# Patient Record
Sex: Female | Born: 1985 | Race: White | Hispanic: No | Marital: Married | Smoking: Never smoker
Health system: Southern US, Community
[De-identification: ages and names within clinical notes are randomized; demographics above are authoritative.]

## PROBLEM LIST (undated history)

## (undated) ENCOUNTER — Inpatient Hospital Stay (HOSPITAL_COMMUNITY): Payer: Self-pay

## (undated) DIAGNOSIS — Z789 Other specified health status: Secondary | ICD-10-CM

## (undated) DIAGNOSIS — IMO0002 Reserved for concepts with insufficient information to code with codable children: Secondary | ICD-10-CM

## (undated) DIAGNOSIS — M419 Scoliosis, unspecified: Secondary | ICD-10-CM

## (undated) DIAGNOSIS — D649 Anemia, unspecified: Secondary | ICD-10-CM

## (undated) DIAGNOSIS — R87619 Unspecified abnormal cytological findings in specimens from cervix uteri: Secondary | ICD-10-CM

## (undated) DIAGNOSIS — F419 Anxiety disorder, unspecified: Secondary | ICD-10-CM

## (undated) DIAGNOSIS — K219 Gastro-esophageal reflux disease without esophagitis: Secondary | ICD-10-CM

## (undated) DIAGNOSIS — R87629 Unspecified abnormal cytological findings in specimens from vagina: Secondary | ICD-10-CM

## (undated) DIAGNOSIS — I739 Peripheral vascular disease, unspecified: Secondary | ICD-10-CM

## (undated) DIAGNOSIS — B977 Papillomavirus as the cause of diseases classified elsewhere: Secondary | ICD-10-CM

## (undated) DIAGNOSIS — K429 Umbilical hernia without obstruction or gangrene: Secondary | ICD-10-CM

## (undated) DIAGNOSIS — R51 Headache: Secondary | ICD-10-CM

## (undated) HISTORY — PX: OTHER SURGICAL HISTORY: SHX169

## (undated) HISTORY — PX: BREAST ENHANCEMENT SURGERY: SHX7

## (undated) HISTORY — PX: SPINAL FUSION: SHX223

## (undated) HISTORY — PX: DENTAL SURGERY: SHX609

## (undated) HISTORY — PX: ABDOMINAL HYSTERECTOMY: SHX81

## (undated) HISTORY — PX: BACK SURGERY: SHX140

## (undated) HISTORY — PX: WISDOM TOOTH EXTRACTION: SHX21

## (undated) HISTORY — PX: HERNIA REPAIR: SHX51

---

## 2007-12-25 ENCOUNTER — Emergency Department (HOSPITAL_COMMUNITY): Admission: EM | Admit: 2007-12-25 | Discharge: 2007-12-26 | Payer: Self-pay | Admitting: Emergency Medicine

## 2008-03-29 ENCOUNTER — Emergency Department (HOSPITAL_COMMUNITY): Admission: EM | Admit: 2008-03-29 | Discharge: 2008-03-29 | Payer: Self-pay | Admitting: Emergency Medicine

## 2008-07-08 ENCOUNTER — Emergency Department (HOSPITAL_COMMUNITY): Admission: EM | Admit: 2008-07-08 | Discharge: 2008-07-09 | Payer: Self-pay | Admitting: Emergency Medicine

## 2008-09-25 ENCOUNTER — Emergency Department (HOSPITAL_COMMUNITY): Admission: EM | Admit: 2008-09-25 | Discharge: 2008-09-25 | Payer: Self-pay | Admitting: Emergency Medicine

## 2010-08-19 LAB — GC/CHLAMYDIA PROBE AMP, GENITAL
Chlamydia: NEGATIVE
Gonorrhea: NEGATIVE

## 2010-08-19 LAB — ABO/RH

## 2010-08-19 LAB — HEPATITIS B SURFACE ANTIGEN: Hepatitis B Surface Ag: NEGATIVE

## 2010-08-19 LAB — RUBELLA ANTIBODY, IGM: Rubella: IMMUNE

## 2010-09-15 LAB — URINE MICROSCOPIC-ADD ON

## 2010-09-15 LAB — WET PREP, GENITAL: Clue Cells Wet Prep HPF POC: NONE SEEN

## 2010-09-15 LAB — URINALYSIS, ROUTINE W REFLEX MICROSCOPIC
Glucose, UA: NEGATIVE mg/dL
Leukocytes, UA: NEGATIVE
Nitrite: NEGATIVE
Specific Gravity, Urine: 1.007 (ref 1.005–1.030)

## 2010-09-15 LAB — GC/CHLAMYDIA PROBE AMP, GENITAL
Chlamydia, DNA Probe: NEGATIVE
GC Probe Amp, Genital: NEGATIVE

## 2010-09-21 LAB — CBC
HCT: 45.9 % (ref 36.0–46.0)
Hemoglobin: 15.6 g/dL — ABNORMAL HIGH (ref 12.0–15.0)
MCHC: 34 g/dL (ref 30.0–36.0)
MCV: 89 fL (ref 78.0–100.0)
Platelets: 239 10*3/uL (ref 150–400)
RBC: 5.16 MIL/uL — ABNORMAL HIGH (ref 3.87–5.11)
RDW: 12.7 % (ref 11.5–15.5)
WBC: 14.7 10*3/uL — ABNORMAL HIGH (ref 4.0–10.5)

## 2010-09-21 LAB — COMPREHENSIVE METABOLIC PANEL
Alkaline Phosphatase: 65 U/L (ref 39–117)
BUN: 10 mg/dL (ref 6–23)
CO2: 23 mEq/L (ref 19–32)
Calcium: 9.5 mg/dL (ref 8.4–10.5)
GFR calc Af Amer: >60 mL/min (ref >60)
GFR calc non Af Amer: >60 mL/min (ref >60)
Glucose, Bld: 146 mg/dL — ABNORMAL HIGH (ref 70–99)
Total Bilirubin: 0.8 mg/dL (ref 0.3–1.2)
Total Protein: 7.8 g/dL (ref 6.0–8.3)

## 2010-09-21 LAB — DIFFERENTIAL
Eosinophils Absolute: 0 10*3/uL (ref 0.0–0.7)
Eosinophils Relative: 0 % (ref 0–5)
Lymphocytes Relative: 2 % — ABNORMAL LOW (ref 12–46)
Monocytes Absolute: 0.2 10*3/uL (ref 0.1–1.0)
Neutrophils Relative %: 97 % — ABNORMAL HIGH (ref 43–77)

## 2010-09-21 LAB — LIPASE, BLOOD: Lipase: 17 U/L (ref 11–59)

## 2010-09-21 LAB — PREGNANCY, URINE: Preg Test, Ur: NEGATIVE

## 2010-09-21 LAB — URINALYSIS, ROUTINE W REFLEX MICROSCOPIC
Hgb urine dipstick: NEGATIVE
Ketones, ur: NEGATIVE mg/dL

## 2011-01-24 ENCOUNTER — Encounter (HOSPITAL_COMMUNITY): Payer: Self-pay | Admitting: *Deleted

## 2011-01-24 ENCOUNTER — Inpatient Hospital Stay (HOSPITAL_COMMUNITY)
Admission: AD | Admit: 2011-01-24 | Discharge: 2011-01-24 | Disposition: A | Discharge status: Home / Self Care | Source: Ambulatory Visit | Attending: Obstetrics and Gynecology | Admitting: Obstetrics and Gynecology

## 2011-01-24 DIAGNOSIS — O479 False labor, unspecified: Secondary | ICD-10-CM

## 2011-01-24 DIAGNOSIS — O4700 False labor before 37 completed weeks of gestation, unspecified trimester: Secondary | ICD-10-CM | POA: Insufficient documentation

## 2011-01-24 DIAGNOSIS — R109 Unspecified abdominal pain: Secondary | ICD-10-CM | POA: Insufficient documentation

## 2011-01-24 HISTORY — DX: Other specified health status: Z78.9

## 2011-01-24 LAB — URINALYSIS, ROUTINE W REFLEX MICROSCOPIC
Glucose, UA: NEGATIVE mg/dL
Hgb urine dipstick: NEGATIVE
Ketones, ur: NEGATIVE mg/dL
Protein, ur: NEGATIVE mg/dL
Specific Gravity, Urine: 1.01 (ref 1.005–1.030)
pH: 7 (ref 5.0–8.0)

## 2011-01-24 NOTE — Progress Notes (Signed)
Pt reports "my lower back started hurting really bad and this my lower stomach started hurting", pt reports symptoms x 6 hours. Denies bleeding or gush of fluid

## 2011-01-24 NOTE — Progress Notes (Signed)
DENIES HSV AND MRSA.  HAS LOWER ABD AND BACK PAIN- STARTED AT 2 PM. -  IS NOW SAME.  DID NOT TAKE ANYTHING  FOR  PAIN. CALLED DR AT  ON CALL NURSE AT 630PM - TOLD TO COME IN.NO BURNING WITH URINATION.   FEELS BABY MOVE.

## 2011-01-24 NOTE — ED Provider Notes (Signed)
History     Chief Complaint  Patient presents with  . Abdominal Pain   HPI  Pt is here with report of intermittent contractions that started at 1400 today; does not recall the frequency.  Pt denies vaginal bleeding, UTI symptoms, or leaking of fluid.  No reports of recent sexual intercourse.   Past Medical History  Diagnosis Date  . No pertinent past medical history     Past Surgical History  Procedure Date  . Spinal fusion     for scoliosis, spinal fusion with screws and rods  . No past surgeries     No family history on file.  History  Substance Use Topics  . Smoking status: Never Smoker   . Smokeless tobacco: Not on file  . Alcohol Use: No    Allergies: No Known Allergies  Prescriptions prior to admission  Medication Sig Dispense Refill  . acetaminophen (TYLENOL) 500 MG tablet Take 1,000 mg by mouth every 6 (six) hours as needed. Patient took this medication for pain.       Marland Kitchen azithromycin (ZITHROMAX) 250 MG tablet Take 250 mg by mouth daily.        Marland Kitchen dextromethorphan-guaiFENesin (ROBITUSSIN-DM) 10-100 MG/5ML liquid Take 5 mLs by mouth every 4 (four) hours as needed.          Review of Systems  Gastrointestinal: Positive for abdominal pain.  All other systems reviewed and are negative.   Physical Exam   Blood pressure 115/72, pulse 101, temperature 98.3 F (36.8 C), temperature source Oral, resp. rate 20, height 5' (1.524 m), weight 73.993 kg (163 lb 2 oz).  Physical Exam  Constitutional: She is oriented to person, place, and time. She appears well-developed and well-nourished.  HENT:  Head: Normocephalic.  Neck: Normal range of motion. Neck supple.  Cardiovascular: Normal rate, regular rhythm and normal heart sounds.   Respiratory: Effort normal and breath sounds normal.  GI: Soft. There is no tenderness.       FHR 120's, +accel, Category I Toco - irregular contractions, with irritability in between  Genitourinary: No bleeding around the vagina.   Cervix - closed  Neurological: She is alert and oriented to person, place, and time. She has normal reflexes.  Skin: Skin is warm and dry.    MAU Course  Procedures  UA - neg Assessment and Plan  Braxton Hicks  Plan: DC to Home Encourage hydration Return for return in contractions [facility] 01/24/2011, 9:33 PM

## 2011-01-24 NOTE — Consult Note (Signed)
Reviewed HPI/Exam/irregular contraction pattern>DC home with labor precautions.

## 2011-02-07 ENCOUNTER — Inpatient Hospital Stay (HOSPITAL_COMMUNITY)
Admission: AD | Admit: 2011-02-07 | Discharge: 2011-02-07 | Disposition: A | Discharge status: Home / Self Care | Payer: PRIVATE HEALTH INSURANCE | Source: Ambulatory Visit | Attending: Obstetrics and Gynecology | Admitting: Obstetrics and Gynecology

## 2011-02-07 ENCOUNTER — Encounter (HOSPITAL_COMMUNITY): Payer: Self-pay | Admitting: *Deleted

## 2011-02-07 DIAGNOSIS — Z0371 Encounter for suspected problem with amniotic cavity and membrane ruled out: Secondary | ICD-10-CM | POA: Insufficient documentation

## 2011-02-07 DIAGNOSIS — Z3400 Encounter for supervision of normal first pregnancy, unspecified trimester: Secondary | ICD-10-CM

## 2011-02-07 HISTORY — DX: Papillomavirus as the cause of diseases classified elsewhere: B97.7

## 2011-02-07 HISTORY — DX: Scoliosis, unspecified: M41.9

## 2011-02-07 HISTORY — DX: Unspecified abnormal cytological findings in specimens from cervix uteri: R87.619

## 2011-02-07 HISTORY — DX: Headache: R51

## 2011-02-07 HISTORY — DX: Reserved for concepts with insufficient information to code with codable children: IMO0002

## 2011-02-07 LAB — POCT FERN TEST: Fern Test: NEGATIVE

## 2011-02-07 NOTE — ED Provider Notes (Signed)
History     CSN: [id number] Arrival date & time: 02/07/2011  3:26 PM  Chief Complaint  Patient presents with  . Contractions   HPI Molly Rojas is a 25 y.o. female who presents to MAU for leaking vaginal fluid. She states she has had a lot of liquids by mouth today but wanted to be sure her water didn't break.    Past Medical History  Diagnosis Date  . No pertinent past medical history   . Abnormal Pap smear   . Scoliosis   . Headache   . HPV (human papilloma virus) infection     Past Surgical History  Procedure Date  . Spinal fusion placed rods from T-11-L4    for scoliosis, spinal fusion with screws and rods  . No past surgeries   . Back surgery   . Wisdom tooth extraction     Family History  Problem Relation Age of Onset  . Diabetes Mother   . Hyperlipidemia Father   . Diabetes Maternal Aunt   . Diabetes Maternal Uncle   . Diabetes Maternal Grandfather   . Hyperlipidemia Paternal Grandmother   . Heart disease Paternal Grandfather     History  Substance Use Topics  . Smoking status: Never Smoker   . Smokeless tobacco: Not on file  . Alcohol Use: No    OB History    Grav Para Term Preterm Abortions TAB SAB Ect Mult Living   1               Review of Systems  Physical Exam  BP 116/72  Pulse 97  Temp(Src) 98.4 F (36.9 C) (Oral)  Resp 16  Ht 5' (1.524 m)  Wt 164 lb 6.4 oz (74.571 kg)  BMI 32.11 kg/m2  SpO2 99%  Physical Exam  Nursing note and vitals reviewed. Constitutional: She is oriented to person, place, and time. She appears well-developed and well-nourished.  HENT:  Head: Normocephalic.  Eyes: EOM are normal.  Neck: Neck supple.  Pulmonary/Chest: Effort normal.  Abdominal: Soft. There is no tenderness.       Gravid consistent with dates.  Genitourinary:       Cervix closed, thick and high. Uterus consistent with dates.  Musculoskeletal: Normal range of motion.  Neurological: She is alert and oriented to person, place, and time. No  cranial nerve deficit.  Skin: Skin is warm and dry.    ED Course  Procedures  EFM shows reactive tracing. Occasional contraction.  Slide for fern is negative. MDM Consult with Dr. Henderson Cloud.  Assessment: 33.[redacted] weeks gestation without rupture of membranes.  Plan: Follow up as scheduled in the office.          Return here as needed.      [place], [place] 02/07/11 [phone]

## 2011-02-07 NOTE — Progress Notes (Signed)
Pt c/o leaking clear, watery substance @ 1200 and has felt moist since then.

## 2011-02-07 NOTE — Progress Notes (Signed)
Pt states she was at work and when she got up her panties were wet and she has been having abdominal cramping and low back pain since 1200. No wearing a pad but does feel damp.

## 2011-02-16 LAB — STREP B DNA PROBE: GBS: NEGATIVE

## 2011-03-21 ENCOUNTER — Inpatient Hospital Stay (HOSPITAL_COMMUNITY)
Admission: AD | Admit: 2011-03-21 | Discharge: 2011-03-21 | Disposition: A | Discharge status: Home / Self Care | Payer: PRIVATE HEALTH INSURANCE | Source: Ambulatory Visit | Attending: Obstetrics and Gynecology | Admitting: Obstetrics and Gynecology

## 2011-03-21 DIAGNOSIS — O479 False labor, unspecified: Secondary | ICD-10-CM | POA: Insufficient documentation

## 2011-03-21 NOTE — Progress Notes (Signed)
39WKS G1 SVE 1/THICK UC'S 2-3 MIN RATES PAIN 6 OUT OF 10. REACTIVE STRIP. ORDERS GIVEN FOR PT TO WALK FOR AN HOUR THEN RECHECK CERVIX.

## 2011-03-21 NOTE — Progress Notes (Signed)
Pt c/o contractions every 5 min, no leaking fluid or vaginal bleeding. +FM

## 2011-03-21 NOTE — Progress Notes (Signed)
SVE 1/THICK  X2 UC IN 20 MIN. ORDES GIVEN TO D/C HOME WITH LABOR AND KICK COUNTS

## 2011-03-22 ENCOUNTER — Telehealth (HOSPITAL_COMMUNITY): Payer: Self-pay | Admitting: *Deleted

## 2011-03-22 NOTE — Telephone Encounter (Signed)
Preadmission screen  

## 2011-03-24 ENCOUNTER — Inpatient Hospital Stay (HOSPITAL_COMMUNITY)
Admission: AD | Admit: 2011-03-24 | Discharge: 2011-03-24 | Disposition: A | Discharge status: Home / Self Care | Payer: PRIVATE HEALTH INSURANCE | Source: Ambulatory Visit | Attending: Obstetrics and Gynecology | Admitting: Obstetrics and Gynecology

## 2011-03-24 ENCOUNTER — Encounter (HOSPITAL_COMMUNITY): Payer: Self-pay | Admitting: *Deleted

## 2011-03-24 DIAGNOSIS — O479 False labor, unspecified: Secondary | ICD-10-CM | POA: Insufficient documentation

## 2011-03-24 LAB — AMNISURE RUPTURE OF MEMBRANE (ROM) NOT AT ARMC: Amnisure ROM: NEGATIVE

## 2011-03-24 NOTE — Progress Notes (Signed)
Big gush around 0820, clear fluid- still coming.  Saw some blood tinged mucous when wiped. Was 1 cm on Mon. Denies prob with preg.

## 2011-03-28 ENCOUNTER — Encounter (HOSPITAL_COMMUNITY): Payer: Self-pay | Admitting: Anesthesiology

## 2011-03-28 ENCOUNTER — Inpatient Hospital Stay (HOSPITAL_COMMUNITY)
Admission: AD | Admit: 2011-03-28 | Discharge: 2011-03-31 | DRG: 765 | Disposition: A | Discharge status: Home / Self Care | Payer: PRIVATE HEALTH INSURANCE | Source: Ambulatory Visit | Attending: Obstetrics and Gynecology | Admitting: Obstetrics and Gynecology

## 2011-03-28 ENCOUNTER — Inpatient Hospital Stay (HOSPITAL_COMMUNITY): Payer: PRIVATE HEALTH INSURANCE | Admitting: Anesthesiology

## 2011-03-28 ENCOUNTER — Inpatient Hospital Stay (HOSPITAL_COMMUNITY): Admission: RE | Admit: 2011-03-28 | Payer: PRIVATE HEALTH INSURANCE | Source: Ambulatory Visit

## 2011-03-28 ENCOUNTER — Encounter (HOSPITAL_COMMUNITY): Payer: Self-pay | Admitting: *Deleted

## 2011-03-28 ENCOUNTER — Encounter (HOSPITAL_COMMUNITY)
Admission: AD | Disposition: A | Discharge status: Home / Self Care | Payer: Self-pay | Source: Ambulatory Visit | Attending: Obstetrics and Gynecology

## 2011-03-28 DIAGNOSIS — L02219 Cutaneous abscess of trunk, unspecified: Secondary | ICD-10-CM | POA: Diagnosis not present

## 2011-03-28 DIAGNOSIS — O909 Complication of the puerperium, unspecified: Secondary | ICD-10-CM | POA: Diagnosis not present

## 2011-03-28 LAB — CBC
HCT: 37.2 % (ref 36.0–46.0)
Hemoglobin: 12 g/dL (ref 12.0–15.0)
MCHC: 32.3 g/dL (ref 30.0–36.0)
MCV: 84.9 fL (ref 78.0–100.0)
RDW: 13.8 % (ref 11.5–15.5)

## 2011-03-28 LAB — GLUCOSE, CAPILLARY: Glucose-Capillary: 98 mg/dL (ref 70–99)

## 2011-03-28 SURGERY — Surgical Case
Anesthesia: Epidural | Site: Abdomen | Wound class: Clean Contaminated

## 2011-03-28 MED ORDER — MORPHINE SULFATE (PF) 0.5 MG/ML IJ SOLN
INTRAMUSCULAR | Status: DC | PRN
  Administered 2011-03-28: 2 mg via INTRAVENOUS

## 2011-03-28 MED ORDER — OXYTOCIN BOLUS FROM INFUSION
500.0000 mL | Freq: Once | INTRAVENOUS | Status: DC
  Filled 2011-03-28: qty 500

## 2011-03-28 MED ORDER — LIDOCAINE HCL (PF) 1 % IJ SOLN
30.0000 mL | INTRAMUSCULAR | Status: DC | PRN
  Filled 2011-03-28: qty 30

## 2011-03-28 MED ORDER — SCOPOLAMINE 1 MG/3DAYS TD PT72
MEDICATED_PATCH | TRANSDERMAL | Status: AC
  Administered 2011-03-28: 1.5 mg via TRANSDERMAL
  Filled 2011-03-28: qty 1

## 2011-03-28 MED ORDER — ONDANSETRON HCL 4 MG/2ML IJ SOLN
INTRAMUSCULAR | Status: DC | PRN
  Administered 2011-03-28: 4 mg via INTRAVENOUS

## 2011-03-28 MED ORDER — ONDANSETRON HCL 4 MG/2ML IJ SOLN: 4.0000 mg | Freq: Four times a day (QID) | INTRAMUSCULAR | Status: DC | PRN

## 2011-03-28 MED ORDER — CITRIC ACID-SODIUM CITRATE 334-500 MG/5ML PO SOLN
30.0000 mL | ORAL | Status: DC | PRN
  Administered 2011-03-28: 30 mL via ORAL
  Filled 2011-03-28: qty 15

## 2011-03-28 MED ORDER — OXYCODONE-ACETAMINOPHEN 5-325 MG PO TABS: 2.0000 | ORAL_TABLET | ORAL | Status: DC | PRN

## 2011-03-28 MED ORDER — CEFAZOLIN SODIUM 1-5 GM-% IV SOLN
INTRAVENOUS | Status: AC
  Filled 2011-03-28: qty 50

## 2011-03-28 MED ORDER — FLEET ENEMA 7-19 GM/118ML RE ENEM: 1.0000 | ENEMA | RECTAL | Status: DC | PRN

## 2011-03-28 MED ORDER — OXYTOCIN 20 UNITS IN LACTATED RINGERS INFUSION - SIMPLE
1.0000 m[IU]/min | INTRAVENOUS | Status: DC
  Administered 2011-03-28: 2 m[IU]/min via INTRAVENOUS
  Filled 2011-03-28: qty 1000

## 2011-03-28 MED ORDER — PHENYLEPHRINE HCL 10 MG/ML IJ SOLN
INTRAMUSCULAR | Status: DC | PRN
  Administered 2011-03-28: 60 ug via INTRAVENOUS

## 2011-03-28 MED ORDER — SODIUM BICARBONATE 8.4 % IV SOLN
INTRAVENOUS | Status: DC | PRN
  Administered 2011-03-28: 10 mL via EPIDURAL

## 2011-03-28 MED ORDER — OXYTOCIN 20 UNITS IN LACTATED RINGERS INFUSION - SIMPLE
INTRAVENOUS | Status: DC | PRN
  Administered 2011-03-28 (×2): 20 [IU] via INTRAVENOUS

## 2011-03-28 MED ORDER — PHENYLEPHRINE 40 MCG/ML (10ML) SYRINGE FOR IV PUSH (FOR BLOOD PRESSURE SUPPORT)
80.0000 ug | PREFILLED_SYRINGE | INTRAVENOUS | Status: DC | PRN
  Filled 2011-03-28 (×2): qty 5

## 2011-03-28 MED ORDER — SCOPOLAMINE 1 MG/3DAYS TD PT72
1.0000 | MEDICATED_PATCH | Freq: Once | TRANSDERMAL | Status: DC
  Administered 2011-03-28: 1.5 mg via TRANSDERMAL

## 2011-03-28 MED ORDER — LACTATED RINGERS IV SOLN
INTRAVENOUS | Status: DC
  Administered 2011-03-28 (×5): via INTRAVENOUS

## 2011-03-28 MED ORDER — MEPERIDINE HCL 25 MG/ML IJ SOLN: 6.2500 mg | INTRAMUSCULAR | Status: DC | PRN

## 2011-03-28 MED ORDER — OXYTOCIN 20 UNITS IN LACTATED RINGERS INFUSION - SIMPLE: 125.0000 mL/h | Freq: Once | INTRAVENOUS | Status: DC

## 2011-03-28 MED ORDER — MORPHINE SULFATE (PF) 0.5 MG/ML IJ SOLN
INTRAMUSCULAR | Status: DC | PRN
  Administered 2011-03-28: 3 mg via EPIDURAL

## 2011-03-28 MED ORDER — IBUPROFEN 600 MG PO TABS: 600.0000 mg | ORAL_TABLET | Freq: Four times a day (QID) | ORAL | Status: DC | PRN

## 2011-03-28 MED ORDER — LIDOCAINE-EPINEPHRINE (PF) 2 %-1:200000 IJ SOLN
INTRAMUSCULAR | Status: AC
  Filled 2011-03-28: qty 20

## 2011-03-28 MED ORDER — EPHEDRINE 5 MG/ML INJ
10.0000 mg | INTRAVENOUS | Status: DC | PRN
  Filled 2011-03-28: qty 4

## 2011-03-28 MED ORDER — LACTATED RINGERS IV SOLN: 500.0000 mL | INTRAVENOUS | Status: DC | PRN

## 2011-03-28 MED ORDER — EPHEDRINE 5 MG/ML INJ
INTRAVENOUS | Status: AC
  Filled 2011-03-28: qty 10

## 2011-03-28 MED ORDER — CEFAZOLIN SODIUM 1-5 GM-% IV SOLN
1.0000 g | Freq: Once | INTRAVENOUS | Status: AC
  Administered 2011-03-28: 1 g via INTRAVENOUS
  Filled 2011-03-28: qty 50

## 2011-03-28 MED ORDER — DIPHENHYDRAMINE HCL 50 MG/ML IJ SOLN: 12.5000 mg | INTRAMUSCULAR | Status: DC | PRN

## 2011-03-28 MED ORDER — OXYTOCIN 10 UNIT/ML IJ SOLN
INTRAMUSCULAR | Status: AC
  Filled 2011-03-28: qty 2

## 2011-03-28 MED ORDER — MORPHINE SULFATE 0.5 MG/ML IJ SOLN
INTRAMUSCULAR | Status: AC
  Filled 2011-03-28: qty 10

## 2011-03-28 MED ORDER — SODIUM BICARBONATE 8.4 % IV SOLN
INTRAVENOUS | Status: AC
  Filled 2011-03-28: qty 50

## 2011-03-28 MED ORDER — ACETAMINOPHEN 325 MG PO TABS: 650.0000 mg | ORAL_TABLET | ORAL | Status: DC | PRN

## 2011-03-28 MED ORDER — PHENYLEPHRINE 40 MCG/ML (10ML) SYRINGE FOR IV PUSH (FOR BLOOD PRESSURE SUPPORT)
PREFILLED_SYRINGE | INTRAVENOUS | Status: AC
  Filled 2011-03-28: qty 5

## 2011-03-28 MED ORDER — PHENYLEPHRINE 40 MCG/ML (10ML) SYRINGE FOR IV PUSH (FOR BLOOD PRESSURE SUPPORT)
80.0000 ug | PREFILLED_SYRINGE | INTRAVENOUS | Status: DC | PRN
  Filled 2011-03-28: qty 5

## 2011-03-28 MED ORDER — LIDOCAINE HCL 1.5 % IJ SOLN
INTRAMUSCULAR | Status: DC | PRN
  Administered 2011-03-28 (×2): 5 mL via INTRADERMAL

## 2011-03-28 MED ORDER — FENTANYL 2.5 MCG/ML BUPIVACAINE 1/10 % EPIDURAL INFUSION (WH - ANES)
14.0000 mL/h | INTRAMUSCULAR | Status: DC
  Administered 2011-03-28 (×4): 14 mL/h via EPIDURAL
  Filled 2011-03-28 (×4): qty 60

## 2011-03-28 MED ORDER — ONDANSETRON HCL 4 MG/2ML IJ SOLN
INTRAMUSCULAR | Status: AC
  Filled 2011-03-28: qty 2

## 2011-03-28 MED ORDER — KETOROLAC TROMETHAMINE 60 MG/2ML IM SOLN
INTRAMUSCULAR | Status: AC
  Administered 2011-03-28: 60 mg via INTRAMUSCULAR
  Filled 2011-03-28: qty 2

## 2011-03-28 MED ORDER — TERBUTALINE SULFATE 1 MG/ML IJ SOLN: 0.2500 mg | Freq: Once | INTRAMUSCULAR | Status: AC | PRN

## 2011-03-28 MED ORDER — EPHEDRINE 5 MG/ML INJ
10.0000 mg | INTRAVENOUS | Status: DC | PRN
  Filled 2011-03-28 (×2): qty 4

## 2011-03-28 MED ORDER — KETOROLAC TROMETHAMINE 60 MG/2ML IM SOLN
60.0000 mg | Freq: Once | INTRAMUSCULAR | Status: AC | PRN
  Administered 2011-03-28: 60 mg via INTRAMUSCULAR

## 2011-03-28 MED ORDER — LACTATED RINGERS IV SOLN
500.0000 mL | Freq: Once | INTRAVENOUS | Status: AC
  Administered 2011-03-28: 1000 mL via INTRAVENOUS

## 2011-03-28 SURGICAL SUPPLY — 32 items
CLOSURE STERI STRIP 1/2 X4 (GAUZE/BANDAGES/DRESSINGS) ×2 IMPLANT
CLOTH BEACON ORANGE TIMEOUT ST (SAFETY) ×2 IMPLANT
DRESSING TELFA 8X3 (GAUZE/BANDAGES/DRESSINGS) ×2 IMPLANT
DRSG COVADERM 4X6 (GAUZE/BANDAGES/DRESSINGS) ×2 IMPLANT
DRSG PAD ABDOMINAL 8X10 ST (GAUZE/BANDAGES/DRESSINGS) ×2 IMPLANT
ELECT REM PT RETURN 9FT ADLT (ELECTROSURGICAL) ×2
ELECTRODE REM PT RTRN 9FT ADLT (ELECTROSURGICAL) ×1 IMPLANT
EXTRACTOR VACUUM M CUP 4 TUBE (SUCTIONS) IMPLANT
GAUZE SPONGE 4X4 12PLY STRL LF (GAUZE/BANDAGES/DRESSINGS) ×4 IMPLANT
GLOVE BIO SURGEON STRL SZ7 (GLOVE) ×4 IMPLANT
GOWN PREVENTION PLUS LG XLONG (DISPOSABLE) ×4 IMPLANT
GOWN STRL REIN XL XLG (GOWN DISPOSABLE) ×2 IMPLANT
KIT ABG SYR 3ML LUER SLIP (SYRINGE) ×2 IMPLANT
NEEDLE HYPO 25X5/8 SAFETYGLIDE (NEEDLE) ×2 IMPLANT
NS IRRIG 1000ML POUR BTL (IV SOLUTION) ×2 IMPLANT
PACK C SECTION WH (CUSTOM PROCEDURE TRAY) ×2 IMPLANT
PAD ABD 7.5X8 STRL (GAUZE/BANDAGES/DRESSINGS) IMPLANT
SLEEVE SCD COMPRESS KNEE MED (MISCELLANEOUS) ×2 IMPLANT
SPONGE GAUZE 4X4 12PLY (GAUZE/BANDAGES/DRESSINGS) ×2 IMPLANT
STAPLER VISISTAT 35W (STAPLE) ×2 IMPLANT
STRIP CLOSURE SKIN 1/4X4 (GAUZE/BANDAGES/DRESSINGS) ×2 IMPLANT
SUT CHROMIC 0 CT 1 (SUTURE) IMPLANT
SUT CHROMIC 0 CTX 36 (SUTURE) ×6 IMPLANT
SUT CHROMIC 2 0 SH (SUTURE) IMPLANT
SUT PDS AB 0 CT 36 (SUTURE) ×2 IMPLANT
SUT PLAIN 0 NONE (SUTURE) IMPLANT
SUT PLAIN 2 0 XLH (SUTURE) IMPLANT
SUT VIC AB 3-0 CT1 27 (SUTURE) ×1
SUT VIC AB 3-0 CT1 TAPERPNT 27 (SUTURE) ×1 IMPLANT
TOWEL OR 17X24 6PK STRL BLUE (TOWEL DISPOSABLE) ×4 IMPLANT
TRAY FOLEY CATH 14FR (SET/KITS/TRAYS/PACK) ×2 IMPLANT
WATER STERILE IRR 1000ML POUR (IV SOLUTION) IMPLANT

## 2011-03-28 NOTE — Transfer of Care (Signed)
Immediate Anesthesia Transfer of Care Note  Patient: Molly Rojas  Procedure(s) Performed: * No procedures listed *  Patient Location: PACU  Anesthesia Type: Epidural  Level of Consciousness: awake, alert  and oriented  Airway & Oxygen Therapy: Patient Spontanous Breathing  Post-op Assessment: Report given to PACU RN and Post -op Vital signs reviewed and stable  Post vital signs: Reviewed and stable  Complications: No apparent anesthesia complications

## 2011-03-28 NOTE — Anesthesia Procedure Notes (Addendum)
Epidural Patient location during procedure: OB Start time: 03/28/2011 8:47 AM  Staffing Performed by: anesthesiologist   Preanesthetic Checklist Completed: patient identified, site marked, surgical consent, pre-op evaluation, timeout performed, IV checked, risks and benefits discussed and monitors and equipment checked  Epidural Patient position: sitting Prep: site prepped and draped and DuraPrep Patient monitoring: continuous pulse ox and blood pressure Approach: midline Injection technique: LOR air  Needle:  Needle type: Tuohy  Needle gauge: 17 G Needle length: 9 cm Needle insertion depth: 7 cm Catheter type: closed end flexible Catheter size: 19 Gauge Catheter at skin depth: 12 cm Test dose: negative  Assessment Events: blood not aspirated, injection not painful, no injection resistance, negative IV test and no paresthesia  Additional Notes Patient identified.  Risk benefits discussed including failed block, incomplete pain control, headache, nerve damage, paralysis, blood pressure changes, nausea, vomiting, reactions to medication both toxic or allergic, and postpartum back pain.  Patient expressed understanding and wished to proceed.  All questions were answered.  Sterile technique used throughout procedure and epidural site dressed with sterile barrier dressing. No paresthesia or other complications noted.The patient did not experience any signs of intravascular injection such as tinnitus or metallic taste in mouth nor signs of intrathecal spread such as rapid motor block. Please see nursing notes for vital signs.

## 2011-03-28 NOTE — Brief Op Note (Signed)
03/28/2011  9:59 PM  PATIENT:  Molly Rojas  25 y.o. female  PRE-OPERATIVE DIAGNOSIS:  Failure to Progress  POST-OPERATIVE DIAGNOSIS:  Failure to Progress  PROCEDURE:  Procedure(s): CESAREAN SECTION  SURGEON:  Surgeon(s): Carlynn Leduc S Maat Kafer  PHYSICIAN ASSISTANT:   ASSISTANTS: none   ANESTHESIA:   epidural  EBL:  Total I/O In: 240 [P.O.:240] Out: -   BLOOD ADMINISTERED:none  DRAINS: Urinary Catheter (Foley)   LOCAL MEDICATIONS USED:  NONE  SPECIMEN:  No Specimen  DISPOSITION OF SPECIMEN:  na  COUNTS:  YES  TOURNIQUET:  * No tourniquets in log *  DICTATION: .Other Dictation: Dictation Number [id number]  PLAN OF CARE: Admit to inpatient   PATIENT DISPOSITION:  PACU - hemodynamically stable.   Delay start of Pharmacological VTE agent (>24hrs) due to surgical blood loss or risk of bleeding:  no

## 2011-03-28 NOTE — Progress Notes (Signed)
Pt may go to room 163. 

## 2011-03-28 NOTE — Anesthesia Postprocedure Evaluation (Deleted)
  Anesthesia Post-op Note  Patient: Molly Rojas  Procedure(s) Performed:  CESAREAN SECTION   Patient is awake, responsive, moving her legs, and has signs of resolution of her numbness. Pain and nausea are reasonably well controlled. Vital signs are stable and clinically acceptable. Oxygen saturation is clinically acceptable. There are no apparent anesthetic complications at this time. Patient is ready for discharge.  

## 2011-03-28 NOTE — Progress Notes (Signed)
  On 4 mu of pitocin cx 7 cm 90 % vtx  -1 fhr reactive no decel Continue pit

## 2011-03-28 NOTE — Progress Notes (Signed)
  cx arrested at 8 cm for 4+ hours despite adequate ctx's by iupc  fhr reassuring Imp: failure to progresss Plan:proced with cesarean section.Risk of cesarean section discussed.  These include:  Risk of infection;  Risk of hemorrhage that could require transfusions with the associated risk of aids or hepatitis;  Excessive bleeding could require hysterectomy;  Risk of injury to adjacent organs including bladder, bowel or ureters;  Risk of DVT's and possible pulmonary embolus.  Patient expresses a understanding of indications and risks.;

## 2011-03-28 NOTE — Progress Notes (Signed)
cx STILL 8 CM SINCE 5 PM INCREASED SWELLING ON 6 MU PITOCIN FHR REACTIVE NO DECEL IUPC PLACED FOLLOW CX AND CTX'S

## 2011-03-28 NOTE — Progress Notes (Signed)
Dr. Rana Snare notified of membrane status and VE.  Admit orders received.

## 2011-03-28 NOTE — Anesthesia Preprocedure Evaluation (Addendum)
Anesthesia Evaluation  Patient identified by MRN, date of birth, ID band Patient awake  General Assessment Comment  Reviewed: Allergy & Precautions, H&P , Patient's Chart, lab work & pertinent test results  Airway Mallampati: II TM Distance: >3 FB Neck ROM: full    Dental No notable dental hx.    Pulmonary  clear to auscultation  Pulmonary exam normal       Cardiovascular regular Normal    Neuro/Psych  Headaches, Negative Neurological ROS  Negative Psych ROS   GI/Hepatic negative GI ROS Neg liver ROS    Endo/Other  Negative Endocrine ROS  Renal/GU negative Renal ROS     Musculoskeletal   Abdominal   Peds  Hematology negative hematology ROS (+)   Anesthesia Other Findings Sp spinal fusion from t11-l5... Discussed risks of this with respect to epidural and spinal anesthesia  Reproductive/Obstetrics (+) Pregnancy                          Anesthesia Physical Anesthesia Plan  ASA: II  Anesthesia Plan: Epidural   Post-op Pain Management:    Induction:   Airway Management Planned:   Additional Equipment:   Intra-op Plan:   Post-operative Plan:   Informed Consent: I have reviewed the patients History and Physical, chart, labs and discussed the procedure including the risks, benefits and alternatives for the proposed anesthesia with the patient or authorized representative who has indicated his/her understanding and acceptance.     Plan Discussed with:   Anesthesia Plan Comments:         Anesthesia Quick Evaluation

## 2011-03-28 NOTE — Progress Notes (Signed)
  Patient name  Molly Rojas DICTATION# [id number] CSN# [id number]  Juluis Mire, MD 03/28/2011 10:06 PM

## 2011-03-28 NOTE — H&P (Signed)
Molly Rojas is a 25 y.o. female presents at 74 5/7 early labor.  AROM clear. GBS negative   Maternal Medical History:  Reason for admission: Reason for admission: contractions.  Contractions: Onset was 1-2 hours ago.   Frequency: regular.   Perceived severity is moderate.    Fetal activity: Perceived fetal activity is normal.    Prenatal Complications - Diabetes: none.    OB History    Grav Para Term Preterm Abortions TAB SAB Ect Mult Living   1              Past Medical History  Diagnosis Date  . No pertinent past medical history   . Abnormal Pap smear   . Scoliosis   . Headache   . HPV (human papilloma virus) infection    Past Surgical History  Procedure Date  . Spinal fusion placed rods from T-11-L4    for scoliosis, spinal fusion with screws and rods  . No past surgeries   . Back surgery   . Wisdom tooth extraction    Family History: family history includes Diabetes in her maternal aunt, maternal grandfather, maternal uncle, and mother; Heart disease in her paternal grandfather; and Hyperlipidemia in her father and paternal grandmother.  There is no history of Anesthesia problems, and Hypotension, and Malignant hyperthermia, and Pseudochol deficiency, . Social History:  reports that she has never smoked. She does not have any smokeless tobacco history on file. She reports that she does not drink alcohol or use illicit drugs.  ROS  Dilation: 2 Effacement (%): 80 Station: -1 Exam by:: Humphrey Rolls, RN Blood pressure 117/76, pulse 57, temperature 98.5 F (36.9 C), resp. rate 18, height 5' (1.524 m), weight 170 lb (77.111 kg). Maternal Exam:  Uterine Assessment: Contraction strength is moderate.  Contraction frequency is regular.   Abdomen: Patient reports no abdominal tenderness. Fundal height is c/w dates.   Estimated fetal weight is 7.5 lbs.   Fetal presentation: vertex  Introitus: Amniotic fluid character: clear.  Pelvis: adequate for delivery.   Cervix:  Cervix evaluated by digital exam.    FHR reactive no decels Physical Exam  Prenatal labs: ABO, Rh: O/Positive/-- (03/15 0000) Antibody: Negative (03/15 0000) Rubella: Immune (03/15 0000) RPR: Nonreactive (03/15 0000)  HBsAg: Negative (03/15 0000)  HIV: Non-reactive (03/15 0000)  GBS: Negative (09/12 0000)   Assessment/Plan: IUP at 40 5/7 early labor.  AROM and begin pitocin. RIsk of pitocin discussed   Naila Elizondo S 03/28/2011, 7:54 AM

## 2011-03-28 NOTE — Progress Notes (Signed)
Pt reports contractions for 2 hours, some bloody discharge. Reports good fetal movement. Denies problems with preg. G1

## 2011-03-29 ENCOUNTER — Encounter (HOSPITAL_COMMUNITY): Payer: Self-pay | Admitting: Obstetrics and Gynecology

## 2011-03-29 LAB — CBC
HCT: 33 % — ABNORMAL LOW (ref 36.0–46.0)
MCHC: 32.7 g/dL (ref 30.0–36.0)
Platelets: 152 10*3/uL (ref 150–400)
RDW: 13.9 % (ref 11.5–15.5)
WBC: 23.1 10*3/uL — ABNORMAL HIGH (ref 4.0–10.5)

## 2011-03-29 MED ORDER — SODIUM CHLORIDE 0.9 % IJ SOLN: 3.0000 mL | Freq: Two times a day (BID) | INTRAMUSCULAR | Status: DC

## 2011-03-29 MED ORDER — FLEET ENEMA 7-19 GM/118ML RE ENEM: 1.0000 | ENEMA | RECTAL | Status: DC | PRN

## 2011-03-29 MED ORDER — ONDANSETRON HCL 4 MG/2ML IJ SOLN: 4.0000 mg | INTRAMUSCULAR | Status: DC | PRN

## 2011-03-29 MED ORDER — DIPHENHYDRAMINE HCL 50 MG/ML IJ SOLN: 25.0000 mg | INTRAMUSCULAR | Status: DC | PRN

## 2011-03-29 MED ORDER — BISACODYL 10 MG RE SUPP: 10.0000 mg | Freq: Every day | RECTAL | Status: DC | PRN

## 2011-03-29 MED ORDER — DIPHENHYDRAMINE HCL 25 MG PO CAPS
25.0000 mg | ORAL_CAPSULE | Freq: Four times a day (QID) | ORAL | Status: DC | PRN
  Administered 2011-03-29: 25 mg via ORAL
  Filled 2011-03-29: qty 1

## 2011-03-29 MED ORDER — SENNOSIDES-DOCUSATE SODIUM 8.6-50 MG PO TABS
2.0000 | ORAL_TABLET | Freq: Every day | ORAL | Status: DC
  Administered 2011-03-29 – 2011-03-30 (×2): 2 via ORAL

## 2011-03-29 MED ORDER — NALOXONE HCL 0.4 MG/ML IJ SOLN: 0.4000 mg | INTRAMUSCULAR | Status: DC | PRN

## 2011-03-29 MED ORDER — SODIUM CHLORIDE 0.9 % IV SOLN
1.0000 ug/kg/h | INTRAVENOUS | Status: DC | PRN
  Filled 2011-03-29: qty 2.5

## 2011-03-29 MED ORDER — WITCH HAZEL-GLYCERIN EX PADS: 1.0000 "application " | MEDICATED_PAD | CUTANEOUS | Status: DC | PRN

## 2011-03-29 MED ORDER — SODIUM CHLORIDE 0.9 % IJ SOLN: 3.0000 mL | INTRAMUSCULAR | Status: DC | PRN

## 2011-03-29 MED ORDER — LACTATED RINGERS IV SOLN
INTRAVENOUS | Status: DC
  Administered 2011-03-29 (×2): via INTRAVENOUS

## 2011-03-29 MED ORDER — DIPHENHYDRAMINE HCL 50 MG/ML IJ SOLN: 12.5000 mg | INTRAMUSCULAR | Status: DC | PRN

## 2011-03-29 MED ORDER — ZOLPIDEM TARTRATE 5 MG PO TABS: 5.0000 mg | ORAL_TABLET | Freq: Every evening | ORAL | Status: DC | PRN

## 2011-03-29 MED ORDER — SIMETHICONE 80 MG PO CHEW: 80.0000 mg | CHEWABLE_TABLET | ORAL | Status: DC | PRN

## 2011-03-29 MED ORDER — OXYCODONE-ACETAMINOPHEN 5-325 MG PO TABS
1.0000 | ORAL_TABLET | ORAL | Status: DC | PRN
  Administered 2011-03-29 – 2011-03-31 (×10): 1 via ORAL
  Administered 2011-03-31: 2 via ORAL
  Filled 2011-03-29: qty 1
  Filled 2011-03-29: qty 2
  Filled 2011-03-29 (×9): qty 1

## 2011-03-29 MED ORDER — TETANUS-DIPHTH-ACELL PERTUSSIS 5-2.5-18.5 LF-MCG/0.5 IM SUSP: 0.5000 mL | Freq: Once | INTRAMUSCULAR | Status: DC

## 2011-03-29 MED ORDER — NALBUPHINE HCL 10 MG/ML IJ SOLN
5.0000 mg | INTRAMUSCULAR | Status: DC | PRN
  Filled 2011-03-29: qty 1

## 2011-03-29 MED ORDER — ONDANSETRON HCL 4 MG/2ML IJ SOLN: 4.0000 mg | Freq: Three times a day (TID) | INTRAMUSCULAR | Status: DC | PRN

## 2011-03-29 MED ORDER — PRENATAL PLUS 27-1 MG PO TABS
1.0000 | ORAL_TABLET | Freq: Every day | ORAL | Status: DC
  Administered 2011-03-29 – 2011-03-31 (×3): 1 via ORAL
  Filled 2011-03-29 (×3): qty 1

## 2011-03-29 MED ORDER — IBUPROFEN 600 MG PO TABS
600.0000 mg | ORAL_TABLET | Freq: Four times a day (QID) | ORAL | Status: DC | PRN
  Filled 2011-03-29 (×8): qty 1

## 2011-03-29 MED ORDER — KETOROLAC TROMETHAMINE 30 MG/ML IJ SOLN: 30.0000 mg | Freq: Four times a day (QID) | INTRAMUSCULAR | Status: AC | PRN

## 2011-03-29 MED ORDER — OXYTOCIN 20 UNITS IN LACTATED RINGERS INFUSION - SIMPLE: 125.0000 mL/h | INTRAVENOUS | Status: AC

## 2011-03-29 MED ORDER — MENTHOL 3 MG MT LOZG: 1.0000 | LOZENGE | OROMUCOSAL | Status: DC | PRN

## 2011-03-29 MED ORDER — METOCLOPRAMIDE HCL 5 MG/ML IJ SOLN: 10.0000 mg | Freq: Three times a day (TID) | INTRAMUSCULAR | Status: DC | PRN

## 2011-03-29 MED ORDER — SIMETHICONE 80 MG PO CHEW
80.0000 mg | CHEWABLE_TABLET | Freq: Three times a day (TID) | ORAL | Status: DC
  Administered 2011-03-29 – 2011-03-31 (×7): 80 mg via ORAL

## 2011-03-29 MED ORDER — ONDANSETRON HCL 4 MG PO TABS: 4.0000 mg | ORAL_TABLET | ORAL | Status: DC | PRN

## 2011-03-29 MED ORDER — DIPHENHYDRAMINE HCL 25 MG PO CAPS: 25.0000 mg | ORAL_CAPSULE | ORAL | Status: DC | PRN

## 2011-03-29 MED ORDER — DIBUCAINE 1 % RE OINT: 1.0000 "application " | TOPICAL_OINTMENT | RECTAL | Status: DC | PRN

## 2011-03-29 MED ORDER — SODIUM CHLORIDE 0.9 % IV SOLN: 250.0000 mL | INTRAVENOUS | Status: DC

## 2011-03-29 MED ORDER — IBUPROFEN 600 MG PO TABS
600.0000 mg | ORAL_TABLET | Freq: Four times a day (QID) | ORAL | Status: DC
  Administered 2011-03-29 – 2011-03-31 (×9): 600 mg via ORAL
  Filled 2011-03-29 (×2): qty 1

## 2011-03-29 MED ORDER — LANOLIN HYDROUS EX OINT: 1.0000 "application " | TOPICAL_OINTMENT | CUTANEOUS | Status: DC | PRN

## 2011-03-29 NOTE — Anesthesia Postprocedure Evaluation (Deleted)
  Anesthesia Post-op Note  Patient: Molly Rojas  Procedure(s) Performed:  CESAREAN SECTION   Patient is awake, responsive, moving her legs, and has signs of resolution of her numbness. Pain and nausea are reasonably well controlled. Vital signs are stable and clinically acceptable. Oxygen saturation is clinically acceptable. There are no apparent anesthetic complications at this time. Patient is ready for discharge.

## 2011-03-29 NOTE — Anesthesia Postprocedure Evaluation (Deleted)
  Anesthesia Post-op Note  Patient: Molly Rojas  Procedure(s) Performed:  CESAREAN SECTION  Patient Location: Mother/Baby  Anesthesia Type: Epidural  Level of Consciousness: awake, alert , oriented and patient cooperative  Airway and Oxygen Therapy: Patient Spontanous Breathing  Post-op Pain: none  Post-op Assessment: Patient's Cardiovascular Status Stable, Respiratory Function Stable, Patent Airway, No signs of Nausea or vomiting, Adequate PO intake and Pain level controlled  Post-op Vital Signs: Reviewed and stable  Complications: No apparent anesthesia complications

## 2011-03-29 NOTE — Anesthesia Postprocedure Evaluation (Signed)
Anesthesia Post Note  Patient: Molly Rojas  Procedure(s) Performed:  CESAREAN SECTION  Anesthesia type: Epidural  Patient location: Mother/Baby  Post pain: Pain level controlled  Post assessment: Post-op Vital signs reviewed  Last Vitals:  Filed Vitals:   03/29/11 0805  BP: 98/62  Pulse: 62  Temp: 36.7 C  Resp: 18    Post vital signs: Reviewed  Level of consciousness: awake  Complications: No apparent anesthesia complications

## 2011-03-29 NOTE — Op Note (Signed)
NAMEJHORDYN, Molly Rojas              ACCOUNT NO.:  [id number]  MEDICAL RECORD NO.:  [id number]  LOCATION:  9122                          FACILITY:  [facility]  PHYSICIAN:  Juluis Mire, M.D.   DATE OF BIRTH:  1986/04/14  DATE OF PROCEDURE:  03/28/2011 DATE OF DISCHARGE:                              OPERATIVE REPORT   PREOPERATIVE DIAGNOSIS:  Intrauterine pregnancy at 40+ weeks, failure to progress.  POSTOPERATIVE DIAGNOSIS:  Intrauterine pregnancy at 40+ weeks, failure to progress.  OPERATIVE PROCEDURE:  Low transverse cesarean section.  SURGEON:  Juluis Mire, MD  ANESTHESIA:  Epidural.  ESTIMATED BLOOD LOSS:  500-600 mL.  PACKS AND DRAINS:  None.  INTRAOPERATIVE BLOOD REPLACED:  None.  COMPLICATIONS:  None.  INDICATIONS:  A 25 year old primigravida female presented with spontaneous onset of labor.  Despite Pitocin as guided by intrauterine pressure catheter, the patient arrested 8 cm dilatation with increasing molding as well as increasing __________ on the cervix.  This lasted for 4 hours with no progression.  Decision was made to proceed with primary cesarean section for failure to progress.  The risks were discussed including the risk of infection.  The risk of hemorrhage that could require transfusion with the risk of AIDS, hepatitis.  The risk of injury to adjacent organs including bladder, bowel, ureters that could require further exploratory surgery.  Risk of deep venous thrombosis and pulmonary embolus.  The patient expressed understanding of indications and risk.  PROCEDURE:  The patient was taken to OR, placed supine position with left lateral tilt.  After satisfactory level of epidural anesthesia was obtained, the abdomen was prepped out with Betadine and draped in sterile field.  A low transverse skin incision was made with knife, carried through subcutaneous tissue.  Anterior rectus fascia was entered sharply, incision fashioned laterally.  Fascia taken off  the muscle superiorly inferiorly.  Rectus muscles separated midline.  Perineum was entered sharply.  Incision of peritoneum extended both superiorly and inferiorly.  Low transverse bladder flap was developed.  Low transverse uterine incision begun with knife, extended laterally using manual traction.  The infant presented in vertex presentation, was delivered with elevation of head and fundal pressure.  The infant was a viable female, Apgars were 9/9.  Weight and pH are pending.  Placenta was delivered manually.  Uterus was exteriorized for closure.  Uterus was closed with interlocking suture of 0 chromic using a 2-layer closure technique.  We had good hemostasis and clear urine output.  Tubes and ovaries were unremarkable. Uterus was turned to abdominal cavity.  Muscles and peritoneum closed with a suture of 3-0 Vicryl.  Fascia closed with a running suture of 0 PDS.  Skin was closed with staples and Steri-Strips.  Sponge, instrument, and needle count was correct by circulating nurse x2.     Juluis Mire, M.D.     JSM/MEDQ  D:  03/28/2011  T:  03/29/2011  Job:  [id number]

## 2011-03-29 NOTE — Progress Notes (Signed)
Subjective: Postpartum Day 1: Cesarean Delivery Patient reports tolerating PO.    Objective: Vital signs in last 24 hours: Temp:  [97.4 F (36.3 C)-99.2 F (37.3 C)] 98 F (36.7 C) (10/23 0805) Pulse Rate:  [55-96] 62  (10/23 0805) Resp:  [8-21] 18  (10/23 0805) BP: (94-131)/(24-88) 98/62 mmHg (10/23 0805) SpO2:  [94 %-100 %] 94 % (10/23 0530)  Physical Exam:  General: alert and cooperative Lochia: appropriate Uterine Fundus: firm abd dressing noted with small old drainage noted on bandage. Foley with adequate op Decreased BS noted DVT Evaluation: No evidence of DVT seen on physical exam.   Basename 03/29/11 0540 03/28/11 0610  HGB 10.8* 12.0  HCT 33.0* 37.2    Assessment/Plan: Status post Cesarean section. Doing well postoperatively.  Continue current care. Increase warm beverages CBC in am  Percy Comp G 03/29/2011, 8:25 AM

## 2011-03-30 LAB — CBC
HCT: 29.8 % — ABNORMAL LOW (ref 36.0–46.0)
MCV: 85.4 fL (ref 78.0–100.0)
RBC: 3.49 MIL/uL — ABNORMAL LOW (ref 3.87–5.11)
WBC: 16.3 10*3/uL — ABNORMAL HIGH (ref 4.0–10.5)

## 2011-03-30 NOTE — Progress Notes (Signed)
Subjective: Postpartum Day 2: Cesarean Delivery Patient reports tolerating PO, + flatus and no problems voiding.    Objective: Vital signs in last 24 hours: Temp:  [97.6 F (36.4 C)-97.7 F (36.5 C)] 97.7 F (36.5 C) (10/24 0531) Pulse Rate:  [70-74] 70  (10/24 0531) Resp:  [18] 18  (10/24 0531) BP: (92-100)/(57-62) 92/57 mmHg (10/24 0531) SpO2:  [97 %] 97 % (10/23 2102)  Physical Exam:  General: alert and cooperative Lochia: appropriate Uterine Fundus: firm Incision: healing well DVT Evaluation: No evidence of DVT seen on physical exam.   Basename 03/30/11 0515 03/29/11 0540  HGB 9.7* 10.8*  HCT 29.8* 33.0*    Assessment/Plan: Status post Cesarean section. Doing well postoperatively.  Continue current care.  Khalif Stender G 03/30/2011, 8:12 AM

## 2011-03-31 MED ORDER — CEPHALEXIN 500 MG PO CAPS: 500.0000 mg | ORAL_CAPSULE | Freq: Four times a day (QID) | ORAL | Status: AC

## 2011-03-31 MED ORDER — OXYCODONE-ACETAMINOPHEN 5-325 MG PO TABS: 1.0000 | ORAL_TABLET | ORAL | Status: AC | PRN

## 2011-03-31 MED ORDER — IBUPROFEN 600 MG PO TABS: 600.0000 mg | ORAL_TABLET | Freq: Four times a day (QID) | ORAL | Status: AC | PRN

## 2011-03-31 NOTE — Progress Notes (Signed)
Subjective: Postpartum Day 3: Cesarean Delivery Patient reports tolerating PO, + flatus and no problems voiding.    Objective: Vital signs in last 24 hours: Temp:  [97.7 F (36.5 C)-97.8 F (36.6 C)] 97.8 F (36.6 C) (10/25 0454) Pulse Rate:  [70-82] 70  (10/25 0613) Resp:  [18] 18  (10/25 0613) BP: (105-111)/(68-76) 105/76 mmHg (10/25 0981)  Physical Exam:  General: alert and cooperative Lochia: appropriate Uterine Fundus: firm Incision: erythema noted superior to incision. Incision CDI, staples left in place DVT Evaluation: No evidence of DVT seen on physical exam.   Basename 03/30/11 0515 03/29/11 0540  HGB 9.7* 10.8*  HCT 29.8* 33.0*    Assessment/Plan: Status post Cesarean section. Doing well postoperatively. mild cellulitis Discharge home with standard precautions and return to clinic in 4 days.  Herlinda Heady G 03/31/2011, 8:26 AM

## 2011-03-31 NOTE — Discharge Summary (Signed)
Obstetric Discharge Summary Reason for Admission: onset of labor Prenatal Procedures: none Intrapartum Procedures: cesarean: low cervical, transverse Postpartum Procedures: none Complications-Operative and Postpartum: mild cellulitis Hemoglobin  Date Value Range Status  03/30/2011 9.7* 12.0-15.0 (g/dL) Final     HCT  Date Value Range Status  03/30/2011 29.8* 36.0-46.0 (%) Final    Discharge Diagnoses: Term Pregnancy-delivered  Discharge Information: Date: 03/31/2011 Activity: pelvic rest Diet: routine Medications: PNV, Ibuprofen, Percocet and keflex Condition: stable Instructions: refer to practice specific booklet Discharge to: home   Newborn Data: Live born female  Birth Weight: 8 lb 10 oz (3912 g) APGAR: 9, 9  Home with mother.  Molly Rojas G 03/31/2011, 8:34 AM

## 2012-06-05 ENCOUNTER — Emergency Department (HOSPITAL_COMMUNITY)

## 2012-06-05 ENCOUNTER — Emergency Department (HOSPITAL_COMMUNITY)
Admission: EM | Admit: 2012-06-05 | Discharge: 2012-06-05 | Disposition: A | Discharge status: Home / Self Care | Attending: Emergency Medicine | Admitting: Emergency Medicine

## 2012-06-05 ENCOUNTER — Encounter (HOSPITAL_COMMUNITY): Payer: Self-pay | Admitting: Emergency Medicine

## 2012-06-05 DIAGNOSIS — Z8619 Personal history of other infectious and parasitic diseases: Secondary | ICD-10-CM | POA: Insufficient documentation

## 2012-06-05 DIAGNOSIS — M545 Low back pain, unspecified: Secondary | ICD-10-CM | POA: Insufficient documentation

## 2012-06-05 DIAGNOSIS — Z981 Arthrodesis status: Secondary | ICD-10-CM | POA: Insufficient documentation

## 2012-06-05 DIAGNOSIS — Z8739 Personal history of other diseases of the musculoskeletal system and connective tissue: Secondary | ICD-10-CM | POA: Insufficient documentation

## 2012-06-05 DIAGNOSIS — Z8742 Personal history of other diseases of the female genital tract: Secondary | ICD-10-CM | POA: Insufficient documentation

## 2012-06-05 DIAGNOSIS — Z8669 Personal history of other diseases of the nervous system and sense organs: Secondary | ICD-10-CM | POA: Insufficient documentation

## 2012-06-05 LAB — URINALYSIS, ROUTINE W REFLEX MICROSCOPIC
Bilirubin Urine: NEGATIVE
Glucose, UA: NEGATIVE mg/dL
Hgb urine dipstick: NEGATIVE
Specific Gravity, Urine: 1.019 (ref 1.005–1.030)
Urobilinogen, UA: 0.2 mg/dL (ref 0.0–1.0)

## 2012-06-05 LAB — URINE MICROSCOPIC-ADD ON

## 2012-06-05 LAB — POCT PREGNANCY, URINE: Preg Test, Ur: NEGATIVE

## 2012-06-05 MED ORDER — IBUPROFEN 800 MG PO TABS: 800.0000 mg | ORAL_TABLET | Freq: Three times a day (TID) | ORAL | Status: DC

## 2012-06-05 MED ORDER — HYDROMORPHONE HCL PF 1 MG/ML IJ SOLN
1.0000 mg | Freq: Once | INTRAMUSCULAR | Status: AC
  Administered 2012-06-05: 1 mg via INTRAMUSCULAR
  Filled 2012-06-05: qty 1

## 2012-06-05 MED ORDER — IBUPROFEN 800 MG PO TABS
800.0000 mg | ORAL_TABLET | Freq: Once | ORAL | Status: AC
  Administered 2012-06-05: 800 mg via ORAL
  Filled 2012-06-05: qty 1

## 2012-06-05 MED ORDER — HYDROCODONE-ACETAMINOPHEN 5-325 MG PO TABS: 2.0000 | ORAL_TABLET | ORAL | Status: DC | PRN

## 2012-06-05 MED ORDER — CYCLOBENZAPRINE HCL 10 MG PO TABS: 10.0000 mg | ORAL_TABLET | Freq: Two times a day (BID) | ORAL | Status: DC | PRN

## 2012-06-05 MED ORDER — CYCLOBENZAPRINE HCL 10 MG PO TABS
5.0000 mg | ORAL_TABLET | Freq: Once | ORAL | Status: AC
  Administered 2012-06-05: 5 mg via ORAL
  Filled 2012-06-05: qty 1

## 2012-06-05 NOTE — ED Notes (Signed)
Pt taken to CT.

## 2012-06-05 NOTE — ED Notes (Signed)
PT. REPORTS LEFT FLANK PAIN ONSET 3 DAYS AGO , DENIES INJURY , NO DYSURIA OR HEMATURIA.

## 2012-06-05 NOTE — ED Provider Notes (Signed)
History     CSN: [id number]  Arrival date & time 06/05/12  0247   First MD Initiated Contact with Patient 06/05/12 0344      Chief Complaint  Patient presents with  . Flank Pain    (Consider location/radiation/quality/duration/timing/severity/associated sxs/prior treatment) HPI HX per PT, LBP L sided onset 3 days ago, no trauma. Sharp in quality worse with any kind of movement, sudden onset while driving, no h/o same. No radiation of pain, no weakness or numbness in LLE. No incontinence. Persistent pain despite Tylenol at home and heat to her back. No known alleviating factors.   Past Medical History  Diagnosis Date  . No pertinent past medical history   . Abnormal Pap smear   . Scoliosis   . Headache   . HPV (human papilloma virus) infection   . Scoliosis     Past Surgical History  Procedure Date  . Spinal fusion placed rods from T-11-L4    for scoliosis, spinal fusion with screws and rods  . No past surgeries   . Back surgery   . Wisdom tooth extraction   . Cesarean section 03/28/2011    Procedure: CESAREAN SECTION;  Surgeon: Juluis Mire;  Location: [facility];  Service: Gynecology;  Laterality: N/A;    Family History  Problem Relation Age of Onset  . Diabetes Mother   . Hyperlipidemia Father   . Diabetes Maternal Aunt   . Diabetes Maternal Uncle   . Diabetes Maternal Grandfather   . Hyperlipidemia Paternal Grandmother   . Heart disease Paternal Grandfather   . Anesthesia problems Neg Hx   . Hypotension Neg Hx   . Malignant hyperthermia Neg Hx   . Pseudochol deficiency Neg Hx     History  Substance Use Topics  . Smoking status: Never Smoker   . Smokeless tobacco: Not on file  . Alcohol Use: No    OB History    Grav Para Term Preterm Abortions TAB SAB Ect Mult Living   1 1 1       1       Review of Systems  Constitutional: Negative for fever and chills.  HENT: Negative for neck pain and neck stiffness.   Eyes: Negative for pain.  Respiratory:  Negative for shortness of breath.   Cardiovascular: Negative for chest pain.  Gastrointestinal: Negative for abdominal pain.  Genitourinary: Negative for dysuria.  Musculoskeletal: Positive for back pain.  Skin: Negative for rash.  Neurological: Negative for headaches.  All other systems reviewed and are negative.    Allergies  Review of patient's allergies indicates no known allergies.  Home Medications   Current Outpatient Rx  Name  Route  Sig  Dispense  Refill  . ACETAMINOPHEN 500 MG PO TABS   Oral   Take 1,000 mg by mouth every 6 (six) hours as needed. For pain or fever         . PRESCRIPTION MEDICATION   Oral   Take 1 tablet by mouth daily.           BP 116/69  Pulse 77  Temp 98.6 F (37 C) (Oral)  Resp 20  SpO2 98%  LMP 05/22/2012  Breastfeeding? No  Physical Exam  Constitutional: She is oriented to person, place, and time. She appears well-developed and well-nourished.  HENT:  Head: Normocephalic and atraumatic.  Eyes: Conjunctivae normal and EOM are normal. Pupils are equal, round, and reactive to light.  Neck: Trachea normal. Neck supple. No thyromegaly present.  Cardiovascular: Normal rate,  regular rhythm, S1 normal, S2 normal and normal pulses.     No systolic murmur is present   No diastolic murmur is present  Pulses:      Radial pulses are 2+ on the right side, and 2+ on the left side.  Pulmonary/Chest: Effort normal and breath sounds normal. She has no wheezes. She has no rhonchi. She has no rales. She exhibits no tenderness.  Abdominal: Soft. Normal appearance and bowel sounds are normal. There is no tenderness. There is no CVA tenderness and negative Murphy's sign.  Musculoskeletal:       Tender left lower paralumbar without midline tenderness or deformity. No CVA tenderness. No erythema or lesion. no lower extremity deficits with equal strengths and sensorium to light touch.  Neurological: She is alert and oriented to person, place, and time.  She has normal strength. No cranial nerve deficit or sensory deficit. GCS eye subscore is 4. GCS verbal subscore is 5. GCS motor subscore is 6.  Skin: Skin is warm and dry. No rash noted. She is not diaphoretic.  Psychiatric: Her speech is normal.       Cooperative and appropriate    ED Course  Procedures (including critical care time)  Results for orders placed during the hospital encounter of 06/05/12  URINALYSIS, ROUTINE W REFLEX MICROSCOPIC      Component Value Range   Color, Urine YELLOW  YELLOW   APPearance CLOUDY (*) CLEAR   Specific Gravity, Urine 1.019  1.005 - 1.030   pH 5.5  5.0 - 8.0   Glucose, UA NEGATIVE  NEGATIVE mg/dL   Hgb urine dipstick NEGATIVE  NEGATIVE   Bilirubin Urine NEGATIVE  NEGATIVE   Ketones, ur NEGATIVE  NEGATIVE mg/dL   Protein, ur NEGATIVE  NEGATIVE mg/dL   Urobilinogen, UA 0.2  0.0 - 1.0 mg/dL   Nitrite NEGATIVE  NEGATIVE   Leukocytes, UA SMALL (*) NEGATIVE  POCT PREGNANCY, URINE      Component Value Range   Preg Test, Ur NEGATIVE  NEGATIVE  URINE MICROSCOPIC-ADD ON      Component Value Range   Squamous Epithelial / LPF FEW (*) RARE   WBC, UA 0-2  <3 WBC/hpf   Bacteria, UA FEW (*) RARE   Ct Abdomen Pelvis Wo Contrast  06/05/2012  *RADIOLOGY REPORT*  Clinical Data: Left flank pain.  CT ABDOMEN AND PELVIS WITHOUT CONTRAST  Technique:  Multidetector CT imaging of the abdomen and pelvis was performed following the standard protocol without intravenous contrast.  Comparison: None.  Findings: Mild left basilar atelectasis is noted.  The liver and spleen are unremarkable in appearance.  The gallbladder is within normal limits.  The pancreas and adrenal glands are unremarkable.  The kidneys are unremarkable in appearance.  There is no evidence of hydronephrosis.  No renal or ureteral stones are seen.  No perinephric stranding is appreciated.  No free fluid is identified.  The small bowel is unremarkable in appearance.  The stomach is within normal limits.   No acute vascular abnormalities are seen.  The appendix is normal in caliber, without evidence for appendicitis.  The colon is unremarkable in appearance.  A small umbilical hernia is noted, containing only fat, with adjacent metallic piercing.  The bladder is mildly distended and grossly unremarkable.  The uterus is within normal limits.  The ovaries are grossly symmetric; no suspicious adnexal masses are seen.  No inguinal lymphadenopathy is seen.  No acute osseous abnormalities are identified.  Lumbar spinal fusion hardware is noted at  T11-L4.  IMPRESSION:  1.  No acute abnormalities in the abdomen or pelvis. 2.  Small umbilical hernia, containing only fat. 3.  Thoracolumbar spinal fusion hardware is grossly unremarkable in appearance. 4.  Mild left basilar atelectasis noted.   Original Report Authenticated By: Tonia Ghent, M.D.     IM Dilaudid. Motrin Flexeril provided.  Back pain precautions provided and verbalized is understood. Plan primary care followup. Work note provided.  MDM   Low back pain without deficits. Improved with medications as above. No red flags or indication for emergent MRI at this time UA and CT scan reviewed as above. Vital signs and nursing notes reviewed and considered        Sunnie Nielsen, MD 06/05/12 [phone]

## 2013-07-17 ENCOUNTER — Other Ambulatory Visit: Payer: Self-pay | Admitting: Orthopedic Surgery

## 2013-07-17 DIAGNOSIS — M545 Low back pain, unspecified: Secondary | ICD-10-CM

## 2013-07-19 ENCOUNTER — Other Ambulatory Visit: Payer: Self-pay

## 2013-10-07 ENCOUNTER — Other Ambulatory Visit: Payer: Self-pay

## 2013-10-08 ENCOUNTER — Other Ambulatory Visit: Payer: Self-pay

## 2013-10-08 ENCOUNTER — Inpatient Hospital Stay
Admission: RE | Admit: 2013-10-08 | Discharge: 2013-10-08 | Disposition: A | Discharge status: Home / Self Care | Payer: Self-pay | Source: Ambulatory Visit | Attending: Orthopedic Surgery | Admitting: Orthopedic Surgery

## 2013-10-08 NOTE — Discharge Instructions (Signed)
Myelogram Discharge Instructions  1. Go home and rest quietly for the next 24 hours.  It is important to lie flat for the next 24 hours.  Get up only to go to the restroom.  You may lie in the bed or on a couch on your back, your stomach, your left side or your right side.  You may have one pillow under your head.  You may have pillows between your knees while you are on your side or under your knees while you are on your back.  2. DO NOT drive today.  Recline the seat as far back as it will go, while still wearing your seat belt, on the way home.  3. You may get up to go to the bathroom as needed.  You may sit up for 10 minutes to eat.  You may resume your normal diet and medications unless otherwise indicated.  Drink lots of extra fluids today and tomorrow.  4. The incidence of headache, nausea, or vomiting is about 5% (one in 20 patients).  If you develop a headache, lie flat and drink plenty of fluids until the headache goes away.  Caffeinated beverages may be helpful.  If you develop severe nausea and vomiting or a headache that does not go away with flat bed rest, call [phone].  5. You may resume normal activities after your 24 hours of bed rest is over; however, do not exert yourself strongly or do any heavy lifting tomorrow. If when you get up you have a headache when standing, go back to bed and force fluids for another 24 hours.  6. Call your physician for a follow-up appointment.  The results of your myelogram will be sent directly to your physician by the following day.  7. If you have any questions or if complications develop after you arrive home, please call [phone].  Discharge instructions have been explained to the patient.  The patient, or the person responsible for the patient, fully understands these instructions.      May resume Celexa on Oct 09, 2013, after 8:00 am.

## 2013-10-11 ENCOUNTER — Ambulatory Visit
Admission: RE | Admit: 2013-10-11 | Discharge: 2013-10-11 | Disposition: A | Discharge status: Home / Self Care | Source: Ambulatory Visit | Attending: Orthopedic Surgery | Admitting: Orthopedic Surgery

## 2013-10-11 VITALS — BP 110/68 | HR 60

## 2013-10-11 DIAGNOSIS — M545 Low back pain, unspecified: Secondary | ICD-10-CM

## 2013-10-11 MED ORDER — DIAZEPAM 5 MG PO TABS
5.0000 mg | ORAL_TABLET | Freq: Once | ORAL | Status: AC
  Administered 2013-10-11: 5 mg via ORAL

## 2013-10-11 MED ORDER — MEPERIDINE HCL 100 MG/ML IJ SOLN
75.0000 mg | Freq: Once | INTRAMUSCULAR | Status: AC
  Administered 2013-10-11: 75 mg via INTRAMUSCULAR

## 2013-10-11 MED ORDER — IOHEXOL 180 MG/ML  SOLN
15.0000 mL | Freq: Once | INTRAMUSCULAR | Status: AC | PRN
  Administered 2013-10-11: 15 mL via INTRATHECAL

## 2013-10-11 MED ORDER — ONDANSETRON HCL 4 MG/2ML IJ SOLN
4.0000 mg | Freq: Once | INTRAMUSCULAR | Status: AC
  Administered 2013-10-11: 4 mg via INTRAMUSCULAR

## 2013-10-11 NOTE — Progress Notes (Signed)
Patient states she has been off Celexa for at least the past two days.  jkl 

## 2013-10-11 NOTE — Discharge Instructions (Signed)
Myelogram Discharge Instructions  1. Go home and rest quietly for the next 24 hours.  It is important to lie flat for the next 24 hours.  Get up only to go to the restroom.  You may lie in the bed or on a couch on your back, your stomach, your left side or your right side.  You may have one pillow under your head.  You may have pillows between your knees while you are on your side or under your knees while you are on your back.  2. DO NOT drive today.  Recline the seat as far back as it will go, while still wearing your seat belt, on the way home.  3. You may get up to go to the bathroom as needed.  You may sit up for 10 minutes to eat.  You may resume your normal diet and medications unless otherwise indicated.  Drink lots of extra fluids today and tomorrow.  4. The incidence of headache, nausea, or vomiting is about 5% (one in 20 patients).  If you develop a headache, lie flat and drink plenty of fluids until the headache goes away.  Caffeinated beverages may be helpful.  If you develop severe nausea and vomiting or a headache that does not go away with flat bed rest, call [phone].  5. You may resume normal activities after your 24 hours of bed rest is over; however, do not exert yourself strongly or do any heavy lifting tomorrow. If when you get up you have a headache when standing, go back to bed and force fluids for another 24 hours.  6. Call your physician for a follow-up appointment.  The results of your myelogram will be sent directly to your physician by the following day.  7. If you have any questions or if complications develop after you arrive home, please call [phone].  Discharge instructions have been explained to the patient.  The patient, or the person responsible for the patient, fully understands these instructions.      May resume Celexa on Oct 12, 2013, after 8:30 am.

## 2014-04-07 ENCOUNTER — Encounter (HOSPITAL_COMMUNITY): Payer: Self-pay | Admitting: Emergency Medicine

## 2014-06-02 ENCOUNTER — Ambulatory Visit (INDEPENDENT_AMBULATORY_CARE_PROVIDER_SITE_OTHER): Admitting: Emergency Medicine

## 2014-06-02 VITALS — BP 126/80 | HR 91 | Temp 99.4°F | Resp 17 | Ht 60.5 in | Wt 152.0 lb

## 2014-06-02 DIAGNOSIS — J0140 Acute pansinusitis, unspecified: Secondary | ICD-10-CM

## 2014-06-02 DIAGNOSIS — J039 Acute tonsillitis, unspecified: Secondary | ICD-10-CM

## 2014-06-02 MED ORDER — AMOXICILLIN-POT CLAVULANATE 875-125 MG PO TABS: 1.0000 | ORAL_TABLET | Freq: Two times a day (BID) | ORAL | Status: DC

## 2014-06-02 MED ORDER — PSEUDOEPHEDRINE-GUAIFENESIN ER 60-600 MG PO TB12: 1.0000 | ORAL_TABLET | Freq: Two times a day (BID) | ORAL | Status: DC

## 2014-06-02 NOTE — Patient Instructions (Signed)

## 2014-06-02 NOTE — Progress Notes (Signed)
[facility] [address], [place] [address] [phone]  Date:  06/02/2014   Name:  Molly FantasiaBelinda Jokerst   DOB:  [birth year]   MRN:  [id number]  PCP:  No primary care provider on file.    Chief Complaint: Abdominal Pain; Sore Throat; Generalized Body Aches; and Ear Pain   History of Present Illness:  Molly FantasiaBelinda Rojas is a 28 y.o. very pleasant female patient who presents with the following:  Ill since Saturday with sore throat and ear pain.  No coryza or cough Yesterday had fever 102. Now has abdominal cramping pain in epigastrium No stool change, rash or nausea or vomiting. No improvement with over the counter medications or other home remedies.  Denies other complaint or health concern today.   There are no active problems to display for this patient.   No past medical history on file.  No past surgical history on file.  History  Substance Use Topics  . Smoking status: Never Smoker   . Smokeless tobacco: Not on file  . Alcohol Use: Not on file    No family history on file.  No Known Allergies  Medication list has been reviewed and updated.  No current outpatient prescriptions on file prior to visit.   No current facility-administered medications on file prior to visit.    Review of Systems:  As per HPI, otherwise negative.    Physical Examination: Filed Vitals:   06/02/14 0929  BP: 126/80  Pulse: 91  Temp: 99.4 F (37.4 C)  Resp: 17   Filed Vitals:   06/02/14 0929  Height: 5' 0.5" (1.537 m)  Weight: 152 lb (68.947 kg)   Body mass index is 29.19 kg/(m^2). Ideal Body Weight: Weight in (lb) to have BMI = 25: 129.9  GEN: WDWN, NAD, Non-toxic, A & O x 3 HEENT: Atraumatic, Normocephalic. Neck supple. No masses, No LAD.  Exudative pharyngitis Ears and Nose: No external deformity.  Purulent nasal drainage CV: RRR, No M/G/R. No JVD. No thrill. No extra heart sounds. PULM: CTA B, no wheezes, crackles, rhonchi. No retractions. No resp.  distress. No accessory muscle use. ABD: S, NT, ND, +BS. No rebound. No HSM. EXTR: No c/c/e NEURO Normal gait.  PSYCH: Normally interactive. Conversant. Not depressed or anxious appearing.  Calm demeanor.    Assessment and Plan: Sinusitis Tonsillitis augmentin mucinex d  Signed,  Phillips OdorJeffery Darrell Hauk, MD

## 2014-07-10 ENCOUNTER — Ambulatory Visit (INDEPENDENT_AMBULATORY_CARE_PROVIDER_SITE_OTHER): Admitting: Physician Assistant

## 2014-07-10 VITALS — BP 110/76 | HR 93 | Temp 98.4°F | Resp 16 | Ht 60.0 in | Wt 143.6 lb

## 2014-07-10 DIAGNOSIS — A084 Viral intestinal infection, unspecified: Secondary | ICD-10-CM

## 2014-07-10 MED ORDER — ONDANSETRON HCL 4 MG PO TABS: 4.0000 mg | ORAL_TABLET | Freq: Three times a day (TID) | ORAL | Status: DC | PRN

## 2014-07-10 NOTE — Patient Instructions (Signed)
1. Continue to drink fluids.

## 2014-07-11 ENCOUNTER — Encounter: Payer: Self-pay | Admitting: Physician Assistant

## 2014-07-11 NOTE — Progress Notes (Signed)
   Subjective:    Patient ID: Molly Rojas, female    DOB: 07-06-1985, 29 y.o.   MRN: [id number]  HPI Patient presents for 4 days of nausea, vomiting, and diarrhea. Unsure of how many episodes of each she has had. No improvement of sx even with phenergan. Daughter had same symptoms that have now improved. Endorses flatulence, HA, dizziness, or diaphoresis, and generalized abdominal pain that is crampy in quality. No blood or mucus in stool. Daughter recently had symptoms a week ago, but has improved. Patient unable to keep food and liquid down is believes she is becoming dehydrated. Request IV fluids. Denies fever, any URI or urinary sx. LMP 06/12/14 and was regular. Took preg test this morning that was negative. Denies trying new food, eating undercooked food, recent travel outside country, or recent antibiotics use. No h/o IBS/IBD. H/o C-section, but  no appendectomy or cholecystectomy. NKDA.   Review of Systems  Constitutional: Positive for activity change, appetite change and fatigue. Negative for fever, chills, diaphoresis and unexpected weight change.  Respiratory: Negative for shortness of breath.   Cardiovascular: Negative for chest pain.  Gastrointestinal: Positive for nausea, vomiting, abdominal pain and diarrhea. Negative for constipation, blood in stool and abdominal distention.  Genitourinary: Negative.   Neurological: Positive for headaches. Negative for dizziness and light-headedness.       Objective:   Physical Exam  Constitutional: She is oriented to person, place, and time. She appears well-developed and well-nourished. No distress.  Blood pressure 110/76, pulse 93, temperature 98.4 F (36.9 C), temperature source Oral, resp. rate 16, height 5' (1.524 m), weight 143 lb 9.6 oz (65.137 kg), last menstrual period 06/12/2014, SpO2 99 %. BP laying- 110/72 BP sitting- 118/80 BP standing- 116/80  HENT:  Head: Normocephalic and atraumatic.  Right Ear: External ear normal.  Left  Ear: External ear normal.  Mouth/Throat: Uvula is midline, oropharynx is clear and moist and mucous membranes are normal.  Eyes: Right eye exhibits no discharge. Left eye exhibits no discharge. No scleral icterus.  Neck: Neck supple. No thyromegaly present.  Cardiovascular: Normal rate, regular rhythm and normal heart sounds.  Exam reveals no gallop and no friction rub.   No murmur heard. Pulmonary/Chest: Effort normal and breath sounds normal. No respiratory distress. She has no wheezes. She has no rales.  Abdominal: Soft. Normal appearance and bowel sounds are normal. She exhibits no distension and no mass. There is generalized tenderness. There is no rigidity, no rebound, no guarding, no CVA tenderness, no tenderness at McBurney's point and negative Murphy's sign. No hernia.  Musculoskeletal: She exhibits no edema.  Lymphadenopathy:    She has no cervical adenopathy.  Neurological: She is alert and oriented to person, place, and time.  Skin: Skin is warm and dry. No rash noted. She is not diaphoretic. No erythema. No pallor.       Assessment & Plan:  1. Viral gastroenteritis Mucus membranes moist and not orthostatic, IV fluids not needed at this time. Try drinking fluids or chewing ice chips as tolerated. Discontinue phenergan as it has not helped. - ondansetron (ZOFRAN) 4 MG tablet; Take 1 tablet (4 mg total) by mouth every 8 (eight) hours as needed for nausea or vomiting.  Dispense: 20 tablet; Refill: 0   Josmar Messimer PA-C  [facility] 07/11/2014 11:17 AM

## 2014-12-16 LAB — OB RESULTS CONSOLE HIV ANTIBODY (ROUTINE TESTING): HIV: NONREACTIVE

## 2014-12-16 LAB — OB RESULTS CONSOLE GC/CHLAMYDIA
Chlamydia: NEGATIVE
Gonorrhea: NEGATIVE

## 2014-12-16 LAB — OB RESULTS CONSOLE ANTIBODY SCREEN: ANTIBODY SCREEN: NEGATIVE

## 2014-12-16 LAB — OB RESULTS CONSOLE RPR: RPR: NONREACTIVE

## 2014-12-16 LAB — OB RESULTS CONSOLE HEPATITIS B SURFACE ANTIGEN: Hepatitis B Surface Ag: NEGATIVE

## 2014-12-16 LAB — OB RESULTS CONSOLE ABO/RH: RH TYPE: POSITIVE

## 2014-12-16 LAB — OB RESULTS CONSOLE RUBELLA ANTIBODY, IGM: Rubella: IMMUNE

## 2015-03-14 ENCOUNTER — Encounter (HOSPITAL_COMMUNITY): Payer: Self-pay | Admitting: *Deleted

## 2015-03-14 ENCOUNTER — Inpatient Hospital Stay (HOSPITAL_COMMUNITY)
Admission: AD | Admit: 2015-03-14 | Discharge: 2015-03-14 | Disposition: A | Discharge status: Home / Self Care | Source: Ambulatory Visit | Attending: Obstetrics and Gynecology | Admitting: Obstetrics and Gynecology

## 2015-03-14 DIAGNOSIS — R1013 Epigastric pain: Secondary | ICD-10-CM | POA: Insufficient documentation

## 2015-03-14 DIAGNOSIS — N898 Other specified noninflammatory disorders of vagina: Secondary | ICD-10-CM | POA: Diagnosis present

## 2015-03-14 DIAGNOSIS — O26899 Other specified pregnancy related conditions, unspecified trimester: Secondary | ICD-10-CM

## 2015-03-14 DIAGNOSIS — R102 Pelvic and perineal pain: Secondary | ICD-10-CM | POA: Insufficient documentation

## 2015-03-14 LAB — URINALYSIS, ROUTINE W REFLEX MICROSCOPIC
BILIRUBIN URINE: NEGATIVE
Glucose, UA: NEGATIVE mg/dL
Hgb urine dipstick: NEGATIVE
KETONES UR: 15 mg/dL — AB
Leukocytes, UA: NEGATIVE
NITRITE: NEGATIVE
Protein, ur: NEGATIVE mg/dL
Specific Gravity, Urine: 1.025 (ref 1.005–1.030)
UROBILINOGEN UA: 4 mg/dL — AB (ref 0.0–1.0)
pH: 6 (ref 5.0–8.0)

## 2015-03-14 NOTE — MAU Provider Note (Signed)
History     CSN: [id number]  Arrival date and time: 03/14/15 1910   First Provider Initiated Contact with Patient 03/14/15 1944      Chief Complaint  Patient presents with  . Vaginal Discharge   HPI  Molly Rojas is a 29 y.o. G2P1001 at [redacted]w[redacted]d. She presents with orange tinged vaginal discharge and vaginal pain. She noticed thin watery discharge 10/6, went to office. No ROM but had BV, tx with Metrogel. She noticed her discharge was orange tinged this afternoon. She has known placenta previa, no sexual contact for 3+ wks. The vaginal pain is sharp for a few seconds, seems to occur about every 5 min. No urinary or GI changes. No contractions or bleeding, good fetal movement. She also c/o chest pain that radiates to mid back, sometimes with deep breaths- when showing where the pain is, is epigastric area.  OB History    Gravida Para Term Preterm AB TAB SAB Ectopic Multiple Living   Past Medical History  Diagnosis Date  . No pertinent past medical history   . Abnormal Pap smear   . Scoliosis   . Headache(784.0)   . HPV (human papilloma virus) infection   . Scoliosis     Past Surgical History  Procedure Laterality Date  . Spinal fusion  placed rods from T-11-L4    for scoliosis, spinal fusion with screws and rods  . No past surgeries    . Back surgery    . Wisdom tooth extraction    . Cesarean section  03/28/2011    Procedure: CESAREAN SECTION;  Surgeon: Juluis Mire;  Location: [facility];  Service: Gynecology;  Laterality: N/A;    Family History  Problem Relation Age of Onset  . Diabetes Mother   . Hyperlipidemia Father   . Diabetes Maternal Aunt   . Diabetes Maternal Uncle   . Diabetes Maternal Grandfather   . Hyperlipidemia Paternal Grandmother   . Heart disease Paternal Grandfather   . Anesthesia problems Neg Hx   . Hypotension Neg Hx   . Malignant hyperthermia Neg Hx   . Pseudochol deficiency Neg Hx     Social History  Substance Use Topics   . Smoking status: Never Smoker   . Smokeless tobacco: None  . Alcohol Use: No    Allergies: No Known Allergies  Prescriptions prior to admission  Medication Sig Dispense Refill Last Dose  . docusate sodium (COLACE) 100 MG capsule Take 100 mg by mouth daily as needed for mild constipation.   Past Week at Unknown time  . metroNIDAZOLE (METROGEL) 0.75 % vaginal gel Place 1 Applicatorful vaginally at bedtime.   03/13/2015 at Unknown time  . polyethylene glycol (MIRALAX / GLYCOLAX) packet Take 17 g by mouth daily as needed for mild constipation.   Past Week at Unknown time    Review of Systems  Constitutional: Negative for fever and chills.  Gastrointestinal: Positive for heartburn. Negative for abdominal pain.  Genitourinary: Negative for dysuria, urgency and frequency.       No bleeding or leaking + vaginal discharge + vaginal pain   Physical Exam   Blood pressure 121/66, pulse 105, temperature 98.2 F (36.8 C), resp. rate 18, height 5' (1.524 m), weight 68.312 kg (150 lb 9.6 oz), SpO2 100 %.  Physical Exam  Nursing note reviewed. Constitutional: She is oriented to person, place, and time. She appears well-developed and well-nourished.  Cardiovascular: Normal  rate, regular rhythm and normal heart sounds.   Respiratory: Effort normal and breath sounds normal. No respiratory distress. She has no wheezes. She has no rales.  GI: Soft. There is no tenderness.  Genitourinary:  Pelvic exam: Ext gen- nl anatomy, skin intact Vagina-small amt white clumpy discharge-?Metrogel Cx- not visualized, no digital exam done with previa  Musculoskeletal: Normal range of motion.  Neurological: She is alert and oriented to person, place, and time.  Skin: Skin is warm and dry.  Psychiatric: She has a normal mood and affect. Her behavior is normal.    MAU Course  Procedures  MDM   Assessment and Plan  No evidence vaginal bleeding, no cramping, good FHT's Trial tylenol, rest for vaginal  pain Continue to monitor symptoms, if increase to call the office Trial heartburn med for epigastric pain Consulted with Dr Fransisca Connors, Molly Rojas M. 03/14/2015, 8:44 PM

## 2015-03-14 NOTE — MAU Note (Signed)
Watery d/c on Thurs. Went to MD and said everything ok but does have BV. Started on metrogel last night. Yesterday had some sharp pains in vagina. Some better today. Does have placenta previa. Had temp 100 yesterday. Now hurts when i breathe in deeply in lower sternum and back.

## 2015-03-14 NOTE — Progress Notes (Signed)
Written and verbal d/c instructions given and understanding voiced. 

## 2015-03-14 NOTE — Progress Notes (Signed)
No orange d/c noted. Some old gel noted

## 2015-03-16 ENCOUNTER — Encounter (HOSPITAL_COMMUNITY): Payer: Self-pay | Admitting: *Deleted

## 2015-03-16 ENCOUNTER — Encounter (HOSPITAL_COMMUNITY): Payer: Self-pay

## 2015-07-21 ENCOUNTER — Encounter (HOSPITAL_COMMUNITY)
Admission: RE | Admit: 2015-07-21 | Discharge: 2015-07-21 | Disposition: A | Discharge status: Home / Self Care | Source: Ambulatory Visit | Attending: Obstetrics and Gynecology | Admitting: Obstetrics and Gynecology

## 2015-07-21 ENCOUNTER — Encounter (HOSPITAL_COMMUNITY): Payer: Self-pay

## 2015-07-21 HISTORY — DX: Gastro-esophageal reflux disease without esophagitis: K21.9

## 2015-07-21 HISTORY — DX: Anxiety disorder, unspecified: F41.9

## 2015-07-21 LAB — CBC
HEMATOCRIT: 38.3 % (ref 36.0–46.0)
HEMOGLOBIN: 12.7 g/dL (ref 12.0–15.0)
MCH: 27.7 pg (ref 26.0–34.0)
MCHC: 33.2 g/dL (ref 30.0–36.0)
MCV: 83.6 fL (ref 78.0–100.0)
Platelets: 199 10*3/uL (ref 150–400)
RBC: 4.58 MIL/uL (ref 3.87–5.11)
RDW: 14 % (ref 11.5–15.5)
WBC: 10.8 10*3/uL — AB (ref 4.0–10.5)

## 2015-07-21 LAB — ABO/RH: ABO/RH(D): O POS

## 2015-07-21 LAB — TYPE AND SCREEN
ABO/RH(D): O POS
ANTIBODY SCREEN: NEGATIVE

## 2015-07-21 MED ORDER — CEFAZOLIN SODIUM-DEXTROSE 2-3 GM-% IV SOLR
2.0000 g | INTRAVENOUS | Status: AC
  Administered 2015-07-22: 2 g via INTRAVENOUS

## 2015-07-21 NOTE — Patient Instructions (Signed)
Your procedure is scheduled on: July 22, 2015   Enter through the Main Entrance of [facility] at: 11:30 am   Pick up the phone at the desk and dial [phone].  Call this number if you have problems the morning of surgery: [phone].  Remember: Do NOT eat food: after midnight tonight  Do NOT drink clear liquids after: 9:00 am day of surgery  Take these medicines the morning of surgery with a SIP OF WATER: none   Do NOT wear jewelry (body piercing), metal hair clips/bobby pins, or nail polish. Do NOT wear lotions, powders, or perfumes.  You may wear deoderant. Do NOT shave for 48 hours prior to surgery. Do NOT bring valuables to the hospital. Leave suitcase in car.  After surgery it may be brought to your room.  For patients admitted to the hospital, checkout time is 11:00 AM the day of discharge.

## 2015-07-22 ENCOUNTER — Encounter (HOSPITAL_COMMUNITY)
Admission: RE | Disposition: A | Discharge status: Home / Self Care | Payer: Self-pay | Source: Ambulatory Visit | Attending: Obstetrics and Gynecology

## 2015-07-22 ENCOUNTER — Inpatient Hospital Stay (HOSPITAL_COMMUNITY): Admitting: Certified Registered Nurse Anesthetist

## 2015-07-22 ENCOUNTER — Encounter (HOSPITAL_COMMUNITY): Payer: Self-pay | Admitting: *Deleted

## 2015-07-22 ENCOUNTER — Inpatient Hospital Stay (HOSPITAL_COMMUNITY)
Admission: RE | Admit: 2015-07-22 | Discharge: 2015-07-25 | DRG: 766 | Disposition: A | Discharge status: Home / Self Care | Source: Ambulatory Visit | Attending: Obstetrics and Gynecology | Admitting: Obstetrics and Gynecology

## 2015-07-22 DIAGNOSIS — O34211 Maternal care for low transverse scar from previous cesarean delivery: Secondary | ICD-10-CM | POA: Diagnosis present

## 2015-07-22 DIAGNOSIS — Z833 Family history of diabetes mellitus: Secondary | ICD-10-CM

## 2015-07-22 DIAGNOSIS — K219 Gastro-esophageal reflux disease without esophagitis: Secondary | ICD-10-CM | POA: Diagnosis present

## 2015-07-22 DIAGNOSIS — O9962 Diseases of the digestive system complicating childbirth: Secondary | ICD-10-CM | POA: Diagnosis present

## 2015-07-22 DIAGNOSIS — Z3A39 39 weeks gestation of pregnancy: Secondary | ICD-10-CM

## 2015-07-22 DIAGNOSIS — Z98891 History of uterine scar from previous surgery: Secondary | ICD-10-CM

## 2015-07-22 DIAGNOSIS — Z8249 Family history of ischemic heart disease and other diseases of the circulatory system: Secondary | ICD-10-CM

## 2015-07-22 DIAGNOSIS — M419 Scoliosis, unspecified: Secondary | ICD-10-CM | POA: Diagnosis present

## 2015-07-22 HISTORY — DX: History of uterine scar from previous surgery: Z98.891

## 2015-07-22 LAB — RPR: RPR: NONREACTIVE

## 2015-07-22 SURGERY — Surgical Case
Anesthesia: Spinal

## 2015-07-22 MED ORDER — IBUPROFEN 600 MG PO TABS
ORAL_TABLET | ORAL | Status: AC
  Filled 2015-07-22: qty 1

## 2015-07-22 MED ORDER — SIMETHICONE 80 MG PO CHEW: 80.0000 mg | CHEWABLE_TABLET | ORAL | Status: DC | PRN

## 2015-07-22 MED ORDER — LACTATED RINGERS IV SOLN: INTRAVENOUS | Status: DC

## 2015-07-22 MED ORDER — CEFAZOLIN SODIUM-DEXTROSE 2-3 GM-% IV SOLR
INTRAVENOUS | Status: AC
  Filled 2015-07-22: qty 50

## 2015-07-22 MED ORDER — SIMETHICONE 80 MG PO CHEW
80.0000 mg | CHEWABLE_TABLET | ORAL | Status: DC
  Administered 2015-07-23 – 2015-07-24 (×3): 80 mg via ORAL
  Filled 2015-07-22 (×3): qty 1

## 2015-07-22 MED ORDER — PHENYLEPHRINE 8 MG IN D5W 100 ML (0.08MG/ML) PREMIX OPTIME
INJECTION | INTRAVENOUS | Status: DC | PRN
  Administered 2015-07-22: 60 ug/min via INTRAVENOUS

## 2015-07-22 MED ORDER — MORPHINE SULFATE (PF) 0.5 MG/ML IJ SOLN
INTRAMUSCULAR | Status: AC
  Filled 2015-07-22: qty 10

## 2015-07-22 MED ORDER — PRENATAL MULTIVITAMIN CH
1.0000 | ORAL_TABLET | Freq: Every day | ORAL | Status: DC
  Administered 2015-07-23 – 2015-07-25 (×3): 1 via ORAL
  Filled 2015-07-22 (×3): qty 1

## 2015-07-22 MED ORDER — FENTANYL CITRATE (PF) 100 MCG/2ML IJ SOLN
INTRAMUSCULAR | Status: AC
  Filled 2015-07-22: qty 2

## 2015-07-22 MED ORDER — NALBUPHINE HCL 10 MG/ML IJ SOLN: 5.0000 mg | INTRAMUSCULAR | Status: DC | PRN

## 2015-07-22 MED ORDER — NALOXONE HCL 2 MG/2ML IJ SOSY
1.0000 ug/kg/h | PREFILLED_SYRINGE | INTRAVENOUS | Status: DC | PRN
  Filled 2015-07-22: qty 2

## 2015-07-22 MED ORDER — LACTATED RINGERS IV SOLN
INTRAVENOUS | Status: DC | PRN
Start: 2015-07-22 — End: 2015-07-22
  Administered 2015-07-22: 13:00:00 via INTRAVENOUS

## 2015-07-22 MED ORDER — OXYTOCIN 10 UNIT/ML IJ SOLN
40.0000 [IU] | INTRAMUSCULAR | Status: DC | PRN
  Administered 2015-07-22: 40 [IU] via INTRAVENOUS

## 2015-07-22 MED ORDER — ACETAMINOPHEN 325 MG PO TABS: 650.0000 mg | ORAL_TABLET | ORAL | Status: DC | PRN

## 2015-07-22 MED ORDER — MENTHOL 3 MG MT LOZG: 1.0000 | LOZENGE | OROMUCOSAL | Status: DC | PRN

## 2015-07-22 MED ORDER — FENTANYL CITRATE (PF) 100 MCG/2ML IJ SOLN
INTRAMUSCULAR | Status: DC | PRN
  Administered 2015-07-22: 20 ug via INTRATHECAL

## 2015-07-22 MED ORDER — WITCH HAZEL-GLYCERIN EX PADS: 1.0000 "application " | MEDICATED_PAD | CUTANEOUS | Status: DC | PRN

## 2015-07-22 MED ORDER — BUPIVACAINE IN DEXTROSE 0.75-8.25 % IT SOLN
INTRATHECAL | Status: DC | PRN
  Administered 2015-07-22: 1.4 mL via INTRATHECAL

## 2015-07-22 MED ORDER — IBUPROFEN 600 MG PO TABS: 600.0000 mg | ORAL_TABLET | Freq: Four times a day (QID) | ORAL | Status: DC | PRN

## 2015-07-22 MED ORDER — DEXTROSE IN LACTATED RINGERS 5 % IV SOLN
INTRAVENOUS | Status: DC
  Administered 2015-07-22 – 2015-07-23 (×2): 999 mL via INTRAVENOUS

## 2015-07-22 MED ORDER — MEPERIDINE HCL 25 MG/ML IJ SOLN: 6.2500 mg | INTRAMUSCULAR | Status: DC | PRN

## 2015-07-22 MED ORDER — DIBUCAINE 1 % RE OINT: 1.0000 "application " | TOPICAL_OINTMENT | RECTAL | Status: DC | PRN

## 2015-07-22 MED ORDER — SCOPOLAMINE 1 MG/3DAYS TD PT72
MEDICATED_PATCH | TRANSDERMAL | Status: AC
  Administered 2015-07-22: 1.5 mg via TRANSDERMAL
  Filled 2015-07-22: qty 1

## 2015-07-22 MED ORDER — OXYTOCIN 10 UNIT/ML IJ SOLN
INTRAMUSCULAR | Status: AC
  Filled 2015-07-22: qty 4

## 2015-07-22 MED ORDER — TETANUS-DIPHTH-ACELL PERTUSSIS 5-2.5-18.5 LF-MCG/0.5 IM SUSP: 0.5000 mL | Freq: Once | INTRAMUSCULAR | Status: DC

## 2015-07-22 MED ORDER — OXYTOCIN 10 UNIT/ML IJ SOLN: 2.5000 [IU]/h | INTRAVENOUS | Status: AC

## 2015-07-22 MED ORDER — ONDANSETRON HCL 4 MG/2ML IJ SOLN
INTRAMUSCULAR | Status: AC
  Filled 2015-07-22: qty 2

## 2015-07-22 MED ORDER — NALOXONE HCL 0.4 MG/ML IJ SOLN: 0.4000 mg | INTRAMUSCULAR | Status: DC | PRN

## 2015-07-22 MED ORDER — KETOROLAC TROMETHAMINE 30 MG/ML IJ SOLN
30.0000 mg | Freq: Four times a day (QID) | INTRAMUSCULAR | Status: AC | PRN
Start: 2015-07-22 — End: 2015-07-23
  Administered 2015-07-22: 30 mg via INTRAMUSCULAR

## 2015-07-22 MED ORDER — SCOPOLAMINE 1 MG/3DAYS TD PT72
1.0000 | MEDICATED_PATCH | Freq: Once | TRANSDERMAL | Status: DC
  Administered 2015-07-22: 1.5 mg via TRANSDERMAL

## 2015-07-22 MED ORDER — DIPHENHYDRAMINE HCL 25 MG PO CAPS
25.0000 mg | ORAL_CAPSULE | ORAL | Status: DC | PRN
  Filled 2015-07-22: qty 1

## 2015-07-22 MED ORDER — SCOPOLAMINE 1 MG/3DAYS TD PT72: 1.0000 | MEDICATED_PATCH | Freq: Once | TRANSDERMAL | Status: DC

## 2015-07-22 MED ORDER — PROMETHAZINE HCL 25 MG/ML IJ SOLN: 6.2500 mg | INTRAMUSCULAR | Status: DC | PRN

## 2015-07-22 MED ORDER — ONDANSETRON HCL 4 MG/2ML IJ SOLN
INTRAMUSCULAR | Status: DC | PRN
  Administered 2015-07-22: 4 mg via INTRAVENOUS

## 2015-07-22 MED ORDER — KETOROLAC TROMETHAMINE 30 MG/ML IJ SOLN
INTRAMUSCULAR | Status: AC
  Administered 2015-07-22: 30 mg via INTRAMUSCULAR
  Filled 2015-07-22: qty 1

## 2015-07-22 MED ORDER — ACETAMINOPHEN 500 MG PO TABS
1000.0000 mg | ORAL_TABLET | Freq: Four times a day (QID) | ORAL | Status: AC
  Administered 2015-07-22 – 2015-07-23 (×2): 1000 mg via ORAL
  Filled 2015-07-22 (×2): qty 2

## 2015-07-22 MED ORDER — DIPHENHYDRAMINE HCL 50 MG/ML IJ SOLN: 12.5000 mg | INTRAMUSCULAR | Status: DC | PRN

## 2015-07-22 MED ORDER — FAMOTIDINE 20 MG PO TABS
20.0000 mg | ORAL_TABLET | Freq: Two times a day (BID) | ORAL | Status: DC
  Administered 2015-07-23 – 2015-07-25 (×6): 20 mg via ORAL
  Filled 2015-07-22 (×6): qty 1

## 2015-07-22 MED ORDER — DIPHENHYDRAMINE HCL 25 MG PO CAPS: 25.0000 mg | ORAL_CAPSULE | Freq: Four times a day (QID) | ORAL | Status: DC | PRN

## 2015-07-22 MED ORDER — KETOROLAC TROMETHAMINE 30 MG/ML IJ SOLN
30.0000 mg | Freq: Four times a day (QID) | INTRAMUSCULAR | Status: AC | PRN
Start: 2015-07-22 — End: 2015-07-23

## 2015-07-22 MED ORDER — HYDROMORPHONE HCL 1 MG/ML IJ SOLN: 0.2500 mg | INTRAMUSCULAR | Status: DC | PRN

## 2015-07-22 MED ORDER — NALBUPHINE HCL 10 MG/ML IJ SOLN: 5.0000 mg | Freq: Once | INTRAMUSCULAR | Status: DC | PRN

## 2015-07-22 MED ORDER — LACTATED RINGERS IV SOLN
INTRAVENOUS | Status: DC
  Administered 2015-07-22 (×2): via INTRAVENOUS

## 2015-07-22 MED ORDER — PHENYLEPHRINE 8 MG IN D5W 100 ML (0.08MG/ML) PREMIX OPTIME
INJECTION | INTRAVENOUS | Status: AC
  Filled 2015-07-22: qty 100

## 2015-07-22 MED ORDER — MEASLES, MUMPS & RUBELLA VAC ~~LOC~~ INJ
0.5000 mL | INJECTION | Freq: Once | SUBCUTANEOUS | Status: DC
  Filled 2015-07-22: qty 0.5

## 2015-07-22 MED ORDER — SENNOSIDES-DOCUSATE SODIUM 8.6-50 MG PO TABS
2.0000 | ORAL_TABLET | ORAL | Status: DC
  Administered 2015-07-23 – 2015-07-24 (×3): 2 via ORAL
  Filled 2015-07-22 (×3): qty 2

## 2015-07-22 MED ORDER — ZOLPIDEM TARTRATE 5 MG PO TABS: 5.0000 mg | ORAL_TABLET | Freq: Every evening | ORAL | Status: DC | PRN

## 2015-07-22 MED ORDER — IBUPROFEN 600 MG PO TABS
600.0000 mg | ORAL_TABLET | Freq: Four times a day (QID) | ORAL | Status: DC
Start: 2015-07-22 — End: 2015-07-25
  Administered 2015-07-23 – 2015-07-25 (×11): 600 mg via ORAL
  Filled 2015-07-22 (×11): qty 1

## 2015-07-22 MED ORDER — LACTATED RINGERS IV SOLN
Freq: Once | INTRAVENOUS | Status: AC
  Administered 2015-07-22: 12:00:00 via INTRAVENOUS

## 2015-07-22 MED ORDER — MEDROXYPROGESTERONE ACETATE 150 MG/ML IM SUSP: 150.0000 mg | INTRAMUSCULAR | Status: DC | PRN

## 2015-07-22 MED ORDER — KETOROLAC TROMETHAMINE 30 MG/ML IJ SOLN: 30.0000 mg | Freq: Once | INTRAMUSCULAR | Status: DC

## 2015-07-22 MED ORDER — MORPHINE SULFATE (PF) 0.5 MG/ML IJ SOLN
INTRAMUSCULAR | Status: DC | PRN
  Administered 2015-07-22: .2 mg via INTRATHECAL

## 2015-07-22 MED ORDER — LANOLIN HYDROUS EX OINT: 1.0000 "application " | TOPICAL_OINTMENT | CUTANEOUS | Status: DC | PRN

## 2015-07-22 MED ORDER — SODIUM CHLORIDE 0.9% FLUSH: 3.0000 mL | INTRAVENOUS | Status: DC | PRN

## 2015-07-22 MED ORDER — ONDANSETRON HCL 4 MG/2ML IJ SOLN
4.0000 mg | Freq: Three times a day (TID) | INTRAMUSCULAR | Status: DC | PRN
  Filled 2015-07-22: qty 2

## 2015-07-22 MED ORDER — SIMETHICONE 80 MG PO CHEW
80.0000 mg | CHEWABLE_TABLET | Freq: Three times a day (TID) | ORAL | Status: DC
  Administered 2015-07-23 – 2015-07-25 (×5): 80 mg via ORAL
  Filled 2015-07-22 (×6): qty 1

## 2015-07-22 SURGICAL SUPPLY — 32 items
CLAMP CORD UMBIL (MISCELLANEOUS) IMPLANT
CLOTH BEACON ORANGE TIMEOUT ST (SAFETY) ×3 IMPLANT
DERMABOND ADVANCED (GAUZE/BANDAGES/DRESSINGS) ×2
DERMABOND ADVANCED .7 DNX12 (GAUZE/BANDAGES/DRESSINGS) ×1 IMPLANT
DRSG OPSITE POSTOP 4X10 (GAUZE/BANDAGES/DRESSINGS) ×3 IMPLANT
DURAPREP 26ML APPLICATOR (WOUND CARE) ×3 IMPLANT
ELECT REM PT RETURN 9FT ADLT (ELECTROSURGICAL) ×3
ELECTRODE REM PT RTRN 9FT ADLT (ELECTROSURGICAL) ×1 IMPLANT
EXTRACTOR VACUUM M CUP 4 TUBE (SUCTIONS) IMPLANT
EXTRACTOR VACUUM M CUP 4' TUBE (SUCTIONS)
GLOVE BIO SURGEON STRL SZ 6.5 (GLOVE) ×2 IMPLANT
GLOVE BIO SURGEONS STRL SZ 6.5 (GLOVE) ×1
GLOVE BIOGEL PI IND STRL 7.0 (GLOVE) ×2 IMPLANT
GLOVE BIOGEL PI INDICATOR 7.0 (GLOVE) ×4
GOWN STRL REUS W/TWL LRG LVL3 (GOWN DISPOSABLE) ×6 IMPLANT
KIT ABG SYR 3ML LUER SLIP (SYRINGE) IMPLANT
LIQUID BAND (GAUZE/BANDAGES/DRESSINGS) ×3 IMPLANT
NEEDLE HYPO 25X5/8 SAFETYGLIDE (NEEDLE) IMPLANT
NS IRRIG 1000ML POUR BTL (IV SOLUTION) ×3 IMPLANT
PACK C SECTION WH (CUSTOM PROCEDURE TRAY) ×3 IMPLANT
PAD OB MATERNITY 4.3X12.25 (PERSONAL CARE ITEMS) ×3 IMPLANT
PENCIL SMOKE EVAC W/HOLSTER (ELECTROSURGICAL) ×3 IMPLANT
SUT CHROMIC 0 CT 802H (SUTURE) IMPLANT
SUT CHROMIC 0 CTX 36 (SUTURE) ×9 IMPLANT
SUT CHROMIC 3 0 SH 27 (SUTURE) ×3 IMPLANT
SUT MON AB-0 CT1 36 (SUTURE) ×3 IMPLANT
SUT PDS AB 0 CTX 60 (SUTURE) ×3 IMPLANT
SUT PLAIN 0 NONE (SUTURE) IMPLANT
SUT VIC AB 4-0 KS 27 (SUTURE) ×3 IMPLANT
SYR BULB 3OZ (MISCELLANEOUS) ×3 IMPLANT
TOWEL OR 17X24 6PK STRL BLUE (TOWEL DISPOSABLE) ×3 IMPLANT
TRAY FOLEY CATH SILVER 14FR (SET/KITS/TRAYS/PACK) IMPLANT

## 2015-07-22 NOTE — Anesthesia Procedure Notes (Signed)
Spinal Patient location during procedure: OR Start time: 07/22/2015 12:51 PM End time: 07/22/2015 12:54 PM Staffing Anesthesiologist: Leilani Able Performed by: anesthesiologist  Preanesthetic Checklist Completed: patient identified, surgical consent, pre-op evaluation, timeout performed, IV checked, risks and benefits discussed and monitors and equipment checked Spinal Block Patient position: sitting Prep: DuraPrep Patient monitoring: heart rate, cardiac monitor, continuous pulse ox and blood pressure Approach: midline Location: L3-4 Injection technique: single-shot Needle Needle type: Sprotte  Needle gauge: 24 G Needle length: 9 cm Needle insertion depth: 5 cm Assessment Sensory level: T6

## 2015-07-22 NOTE — H&P (Signed)
Molly Rojas is a 30 y.o. female G2P1 @ 39wks  presenting for repeat c-section. History OB History    Gravida Para Term Preterm AB TAB SAB Ectopic Multiple Living   0 0 0 0 0  1     Past Medical History  Diagnosis Date  . No pertinent past medical history   . Abnormal Pap smear   . Scoliosis   . Headache(784.0)   . HPV (human papilloma virus) infection   . Scoliosis   . Anxiety   . GERD (gastroesophageal reflux disease)     with pregnancy    Past Surgical History  Procedure Laterality Date  . Cesarean section    . Spinal fusion  placed rods from T-11-L4    for scoliosis, spinal fusion with screws and rods  . No past surgeries    . Back surgery    . Wisdom tooth extraction    . Cesarean section  03/28/2011    Procedure: CESAREAN SECTION;  Surgeon: Juluis Mire;  Location: [facility];  Service: Gynecology;  Laterality: N/A;  . Dental surgery     Family History: family history includes Diabetes in her maternal aunt, maternal grandfather, maternal uncle, and mother; Heart disease in her paternal grandfather; Hyperlipidemia in her father and paternal grandmother. There is no history of Anesthesia problems, Hypotension, Malignant hyperthermia, or Pseudochol deficiency. Social History:  reports that she has never smoked. She has never used smokeless tobacco. She reports that she drinks alcohol. She reports that she does not use illicit drugs.   Prenatal Transfer Tool  Maternal Diabetes: No Genetic Screening: Normal Maternal Ultrasounds/Referrals: Normal Fetal Ultrasounds or other Referrals:  None Maternal Substance Abuse:  No Significant Maternal Medications:  None Significant Maternal Lab Results:  None Other Comments:  None  ROS    There were no vitals taken for this visit. Exam Physical Exam  Prenatal labs: ABO, Rh: --/--/O POS, O POS (02/14 0950) Antibody: NEG (02/14 0950) Rubella: Immune (07/12 0000) RPR: Non Reactive (02/14 0950)  HBsAg: Negative (07/12 0000)   HIV: Non-reactive (07/12 0000)  GBS:   +  Assessment/Plan: Previous c-section, desires rpt   Eugenio Dollins 07/22/2015, 7:58 AM

## 2015-07-22 NOTE — Anesthesia Preprocedure Evaluation (Signed)
Anesthesia Evaluation  Patient identified by MRN, date of birth, ID band Patient awake    Reviewed: Allergy & Precautions, H&P , NPO status , Patient's Chart, lab work & pertinent test results  Airway Mallampati: I  TM Distance: >3 FB Neck ROM: full    Dental no notable dental hx.    Pulmonary neg pulmonary ROS,    Pulmonary exam normal        Cardiovascular negative cardio ROS Normal cardiovascular exam     Neuro/Psych    GI/Hepatic Neg liver ROS,   Endo/Other  negative endocrine ROS  Renal/GU negative Renal ROS     Musculoskeletal   Abdominal (+) + obese,   Peds  Hematology negative hematology ROS (+)   Anesthesia Other Findings   Reproductive/Obstetrics (+) Pregnancy                             Anesthesia Physical Anesthesia Plan  ASA: II  Anesthesia Plan: Spinal   Post-op Pain Management:    Induction:   Airway Management Planned:   Additional Equipment:   Intra-op Plan:   Post-operative Plan:   Informed Consent: I have reviewed the patients History and Physical, chart, labs and discussed the procedure including the risks, benefits and alternatives for the proposed anesthesia with the patient or authorized representative who has indicated his/her understanding and acceptance.     Plan Discussed with: CRNA and Surgeon  Anesthesia Plan Comments:         Anesthesia Quick Evaluation

## 2015-07-22 NOTE — Anesthesia Postprocedure Evaluation (Signed)
Anesthesia Post Note  Patient: Molly Rojas  Procedure(s) Performed: Procedure(s) (LRB): CESAREAN SECTION (N/A)  Patient location during evaluation: PACU Anesthesia Type: Spinal Level of consciousness: awake Pain management: pain level controlled Vital Signs Assessment: post-procedure vital signs reviewed and stable Respiratory status: spontaneous breathing Cardiovascular status: stable Postop Assessment: no headache, no backache, spinal receding, patient able to bend at knees and no signs of nausea or vomiting Anesthetic complications: no    Last Vitals:  Filed Vitals:   07/22/15 1414 07/22/15 1415  BP:    Pulse: 61 61  Temp:    Resp: 17 14    Last Pain:  Filed Vitals:   07/22/15 1417  PainSc: 0-No pain                 Lashawne Dura JR,JOHN Sarha Bartelt

## 2015-07-22 NOTE — Op Note (Signed)
Cesarean Section Procedure Note   Molly Rojas  07/22/2015  Indications: Scheduled Proceedure/Maternal Request   Pre-operative Diagnosis: previous X 1.   Post-operative Diagnosis: Same   Surgeon: Surgeon(s) and Role:    * Zelphia Cairo, MD - Primary   Assistants: none  Anesthesia: spinal   Procedure Details:  The patient was seen in the Holding Room. The risks, benefits, complications, treatment options, and expected outcomes were discussed with the patient. The patient concurred with the proposed plan, giving informed consent. identified as Molly Rojas and the procedure verified as C-Section Delivery. A Time Out was held and the above information confirmed.  After induction of anesthesia, the patient was draped and prepped in the usual sterile manner. A transverse was made and carried down through the subcutaneous tissue to the fascia. Fascial incision was made and extended transversely. The fascia was separated from the underlying rectus tissue superiorly and inferiorly. The peritoneum was identified and entered. Peritoneal incision was extended longitudinally. The utero-vesical peritoneal reflection was incised transversely and the bladder flap was bluntly freed from the lower uterine segment. A low transverse uterine incision was made. Delivered from cephalic presentation was a vigorous female infant with Apgar scores of 8 at one minute and 9 at five minutes. Cord ph was not sent the umbilical cord was clamped and cut cord blood was obtained for evaluation. The placenta was removed Intact and appeared normal. The uterine outline, tubes and ovaries appeared normal}. The uterine incision was closed with running locked sutures of 0chromic gut.   Hemostasis was observed. Lavage was carried out until clear. Peritoneum reapproximated with monocyrl.  The fascia was then reapproximated with running sutures of 0PDS. The skin was closed with 4-0Vicryl.   Instrument, sponge, and needle counts were  correct prior the abdominal closure and were correct at the conclusion of the case.     Estimated Blood Loss: 600cc  Urine Output: clear  Specimens: none   Complications: no complications  Disposition: PACU - hemodynamically stable.   Maternal Condition: stable   Baby condition / location:  Couplet care / Skin to Skin  Attending Attestation: I was present and scrubbed for the entire procedure.   Signed: Surgeon(s): Zelphia Cairo, MD

## 2015-07-22 NOTE — Transfer of Care (Signed)
Immediate Anesthesia Transfer of Care Note  Patient: Molly Rojas  Procedure(s) Performed: Procedure(s) with comments: CESAREAN SECTION (N/A) - Repeat edc 07/28/15 NKDA  Patient Location: PACU  Anesthesia Type:Spinal  Level of Consciousness: awake, alert  and oriented  Airway & Oxygen Therapy: Patient Spontanous Breathing  Post-op Assessment: Report given to RN and Post -op Vital signs reviewed and stable  Post vital signs: Reviewed and stable  Last Vitals:  Filed Vitals:   07/22/15 1144  BP: 108/82  Temp: 36.8 C  Resp: 18    Complications: No apparent anesthesia complications

## 2015-07-22 NOTE — Progress Notes (Signed)
[facility]  Delivery Note:  C-section       07/22/2015  1:20 PM  I was called to the operating room at the request of the patient's obstetrician (Dr. Adkins) for a repeat c-section.  PRENATAL HX:  This is a 29 y/o G2P1001 at 39 and 1/[redacted] weeks gestation who was admitted for repeat c-section.  The pregnancy has been uncomplicated.   INTRAPARTUM HX:   GBS+, but repeat c-section with AROM at delivery  DELIVERY:  Infant was vigorous at delivery, requiring no resuscitation other than standard warming, drying and stimulation.  APGARs 8 and 9.  Exam within normal limits.  After 5 minutes, baby left with nurse to assist parents with skin-to-skin care.   _____________________ Electronically Signed By: Dilara Navarrete, MD Neonatologist 

## 2015-07-23 ENCOUNTER — Encounter (HOSPITAL_COMMUNITY): Payer: Self-pay | Admitting: Obstetrics and Gynecology

## 2015-07-23 LAB — CBC
HCT: 29.8 % — ABNORMAL LOW (ref 36.0–46.0)
Hemoglobin: 9.7 g/dL — ABNORMAL LOW (ref 12.0–15.0)
MCH: 27.6 pg (ref 26.0–34.0)
MCHC: 32.6 g/dL (ref 30.0–36.0)
MCV: 84.7 fL (ref 78.0–100.0)
PLATELETS: 150 10*3/uL (ref 150–400)
RBC: 3.52 MIL/uL — AB (ref 3.87–5.11)
RDW: 14.4 % (ref 11.5–15.5)
WBC: 9.7 10*3/uL (ref 4.0–10.5)

## 2015-07-23 MED ORDER — OXYCODONE-ACETAMINOPHEN 5-325 MG PO TABS
1.0000 | ORAL_TABLET | ORAL | Status: DC | PRN
  Administered 2015-07-23 (×3): 1 via ORAL
  Administered 2015-07-23: 2 via ORAL
  Administered 2015-07-23: 1 via ORAL
  Administered 2015-07-24 – 2015-07-25 (×5): 2 via ORAL
  Filled 2015-07-23 (×2): qty 2
  Filled 2015-07-23: qty 1
  Filled 2015-07-23: qty 2
  Filled 2015-07-23: qty 1
  Filled 2015-07-23 (×3): qty 2
  Filled 2015-07-23 (×2): qty 1

## 2015-07-23 NOTE — Lactation Note (Signed)
This note was copied from a baby's chart. Lactation Consultation Note Initial visit at 22 hours of age.  Mom reports good feedings and denies pain.  Baby showing feeding cues now.  Mom attempted latch and baby didn't hold breast well at first instructed mom to compress breast tissue for baby to latch deeply and he did well.  Baby has wide flanged lips with rhythmic sucking and swallows.  [facility] LC resources given and discussed.  Encouraged to feed with early cues on demand.  Early newborn behavior discussed.  Hand expression demonstrated by mom with colostrum visible.  Mom to call for assist as needed.    Patient Name: Molly Rojas [id number] Date: 07/23/2015 Reason for consult: Initial assessment   Maternal Data Has patient been taught Hand Expression?: Yes Does the patient have breastfeeding experience prior to this delivery?: Yes  Feeding Feeding Type: Breast Fed Length of feed:  (observed 10 minutes)  LATCH Score/Interventions Latch: Grasps breast easily, tongue down, lips flanged, rhythmical sucking.  Audible Swallowing: Spontaneous and intermittent  Type of Nipple: Everted at rest and after stimulation  Comfort (Breast/Nipple): Soft / non-tender     Hold (Positioning): Assistance needed to correctly position infant at breast and maintain latch. Intervention(s): Breastfeeding basics reviewed;Support Pillows;Position options;Skin to skin  LATCH Score: 9  Lactation Tools Discussed/Used     Consult Status Consult Status: Follow-up Date: 07/24/15 Follow-up type: In-patient    Molly Rojas, Molly Rojas 07/23/2015, 11:22 AM

## 2015-07-23 NOTE — Addendum Note (Signed)
Addendum  created 07/23/15 0909 by Elgie Congo, CRNA   Modules edited: Clinical Notes   Clinical Notes:  File: [id number]

## 2015-07-23 NOTE — Anesthesia Postprocedure Evaluation (Signed)
Anesthesia Post Note  Patient: Tonae Livolsi  Procedure(s) Performed: Procedure(s) (LRB): CESAREAN SECTION (N/A)  Patient location during evaluation: Mother Baby Anesthesia Type: Spinal Level of consciousness: awake and alert Pain management: pain level controlled Vital Signs Assessment: post-procedure vital signs reviewed and stable Respiratory status: spontaneous breathing Cardiovascular status: stable Postop Assessment: no headache, no backache, no signs of nausea or vomiting and adequate PO intake Anesthetic complications: no    Last Vitals:  Filed Vitals:   07/23/15 0128 07/23/15 0542  BP:  90/45  Pulse:  56  Temp: 36.9 C 36.1 C  Resp: 16 16    Last Pain:  Filed Vitals:   07/23/15 0605  PainSc: 3                  Myrella Fahs Hristova

## 2015-07-23 NOTE — Clinical Social Work Maternal (Signed)
CLINICAL SOCIAL WORK MATERNAL/CHILD NOTE  Patient Details  Name: Molly Rojas MRN: [id number] Date of Birth: 10/20/1985  Date:  07/23/2015  Clinical Social Worker Initiating Note:  Lillybeth Tal LCSW Date/ Time Initiated:  07/23/15/0920     Child's Name:  Elijah   Legal Guardian:  Nevia and Mitchell Miotke  Need for Interpreter:  None   Date of Referral:  07/22/15     Reason for Referral:  History of postpartum depression and anxiety   Referral Source:  Central Nursery   Address:  [address] [place], [place] [address]  Phone number:  [phone]   Household Members:  Minor Children, Spouse   Natural Supports (not living in the home):  Immediate Family, Extended Family, Friends   Professional Supports: None   Employment: Full-time   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 Mental Health Concerns: MOB presents with history of postpartum depression/anxiety in 2012, and reported increase in anxiety during this pregnancy.     Cognitive State:  Able to Concentrate , Alert , Goal Oriented , Linear Thinking , Thought Blocking    Mood/Affect:  Happy , Calm , Comfortable    CSW Assessment:  CSW received request for consult due to MOB presenting with a history of postpartum depression/anxiety.  MOB provided consent for the FOB to remain in the room during the assessment. MOB and FOB presented as receptive to the visit as evidence by MOB and FOB readily engaging, participating, and asking questions.  MOB displayed a full range in affect, was in a pleasant mood, and was observed to be attending to and caring for the infant.   MOB reported feelings  of happiness secondary to the infant's birth.  MOB stated that she has a 4 year old daughter, who is happy and excited to be a big sister.  MOB shared that her mother is visiting from [place], and is caring for their daughther at this time. MOB expressed appreciation for the extra support from her mother, who will stay through next week to continue to provide support as she transitions postpartum.  FOB stated that he has some family in the area, but discussed how they have created a support system with their friends that live nearby.  MOB and FOB endorsed feeling well supported.  MOB confirmed history of postpartum depression/anxiety after her daughter was born in 2012. She stated that she was prescribed medication, but she was unable to recall name of medication.  MOB was also unable to recall if the medication was effective, and shared that it was due to the FOB and her father both being deployed, being a first time mother, and feeling like a single parent.  CSW noted that these factors were all contributing factors to MOB's mental health at that time.   MOB reported increase in anxiety during this pregnancy. She stated that there was a period of two months in which she noted decrease in concentration and sleeping due to racing thoughts, increase in irritability, feeling on edge, with decrease in patience.  MOB shared that there were numerous situational stressors including the FOB graduating from school, her starting a new job, her daughter   starting at a new daycare center that was further from her work, and then preparing for the infant.  MOB shared impressions that her anxiety decreased once she moved her daughter's day care closer to her work, adjusting to her new job, and the FOB increasing his presence at home once graduation and school was completed.  FOB stated that he is glad to hear that the MOB felt better, but he shared that he noted her to continue to have anxiety as she prepared for the infant.   MOB stated that she did start attending therapy at [facility], but discontinued appointments after 1-2 sessions due to "feeling better" and her busy schedule.  CSW engaged MOB in cognitive re-structuing activities in order to explore her anxieties and to begin to help her disengage from her anxious thoughts process.  MOB's comments highlight that she has high expectations for herself, and wants to excel in all areas of her life. MOB recognized that this her tendency, and acknowledged that it is impossible to do it all.  CSW continued to explore with MOB how to prioritize what is most important to her, and guided her through exercises to help her cope with the realization that she is unable to do it all.   FOB expressed appreciation for these exercises, and asked additional questions on how best to support the MOB when she feels anxious.  CSW facilitated a conversation between MOB and FOB regarding MOB's preferences, and MOB shared that she prefers to have an opportunity to express her feelings, without the FOB providing feedback or advice to her.    CSW informed MOB and FOB of MOB's increase risk for ongoing mental health complications (history of PPD, increase in anxiety during the pregnancy, and numerous life stressors).  CSW provided education on best practices for treatment, and MOB and FOB verbalized understanding. CSW reviewed the differences between the baby blues and perinatal mood disorders, and MOB and FOB agreed to follow up with MOB's medical providers or MOB's previous therapist if she notes concerns in regards to her MOB's mental health.   FOB requested follow up visit on 2/17 prior to their discharge.  MOB reassured CSW that she feels "good" at this time. She expressed appreciation for the visit, and they agreed to contact CSW if needs arise prior to visit on 2/17.    CSW Plan/Description:   1.Patient/Family Education- Perinatal mood and anxiety disorders 2. Information/Referral  to Community Resources-- Feelings After Birth Support Group 3. No Further Intervention Required/No Barriers to Discharge    Kahli Mayon N, LCSW 07/23/2015, 9:45 AM  

## 2015-07-23 NOTE — Progress Notes (Signed)
Subjective: Postpartum Day 1: Cesarean Delivery Patient reports tolerating PO.    Objective: Vital signs in last 24 hours: Temp:  [97 F (36.1 C)-98.4 F (36.9 C)] 97 F (36.1 C) (02/16 0542) Pulse Rate:  [48-75] 56 (02/16 0542) Resp:  [10-19] 16 (02/16 0542) BP: (81-108)/(44-88) 90/45 mmHg (02/16 0542) SpO2:  [96 %-100 %] 96 % (02/16 0542)  Physical Exam:  General: alert and cooperative Lochia: appropriate Uterine Fundus: firm Incision: healing well DVT Evaluation: No evidence of DVT seen on physical exam. Negative Homan's sign. No cords or calf tenderness. No significant calf/ankle edema.   Recent Labs  07/21/15 0950 07/23/15 0545  HGB 12.7 9.7*  HCT 38.3 29.8*    Assessment/Plan: Status post Cesarean section. Doing well postoperatively.  Continue current care Desires circ prior to discharge.  Royston Bekele G 07/23/2015, 8:01 AM

## 2015-07-24 MED ORDER — BENZONATATE 100 MG PO CAPS
200.0000 mg | ORAL_CAPSULE | Freq: Three times a day (TID) | ORAL | Status: DC
  Administered 2015-07-24 – 2015-07-25 (×4): 200 mg via ORAL
  Filled 2015-07-24 (×4): qty 2

## 2015-07-24 NOTE — Progress Notes (Signed)
Subjective: Postpartum Day 2: Cesarean Delivery Patient reports incisional pain, tolerating PO, + flatus and no problems voiding.  Complains of  productive cough  Objective: Vital signs in last 24 hours: Temp:  [98 F (36.7 C)-98.5 F (36.9 C)] 98.5 F (36.9 C) (02/17 0514) Pulse Rate:  [55-60] 55 (02/17 0514) Resp:  [16-18] 18 (02/17 0514) BP: (93-116)/(55-62) 116/62 mmHg (02/17 0514) SpO2:  [97 %] 97 % (02/16 1330)  Physical Exam:  General: alert and cooperative Lochia: appropriate Uterine Fundus: firm Incision: healing well DVT Evaluation: No evidence of DVT seen on physical exam. Negative Homan's sign. No cords or calf tenderness. No significant calf/ankle edema. Lungs CTA   Recent Labs  07/21/15 0950 07/23/15 0545  HGB 12.7 9.7*  HCT 38.3 29.8*    Assessment/Plan: Status post Cesarean section. Doing well postoperatively.  Continue current care  Tessalon pearles  deisres circ prior to discharge.  CURTIS,CAROL G 07/24/2015, 7:45 AM

## 2015-07-24 NOTE — Lactation Note (Signed)
This note was copied from a baby's chart. Lactation Consultation Note Baby had 7% weight loss at a day and half old. Has had 6 voids and 3 stools. Baby is BF a lot. Monitoring I&O will continue. I feel that weight loss maybe from output. Appears to be transfering colostrum.  Patient Name: Molly Rojas [id number] Date: 07/24/2015     Maternal Data    Feeding    LATCH Score/Interventions                      Lactation Tools Discussed/Used     Consult Status      Samin Milke, Diamond Nickel 07/24/2015, 7:20 AM

## 2015-07-24 NOTE — Lactation Note (Signed)
This note was copied from a baby's chart. Lactation Consultation Note  Baby latched in football hold upon entering with a few swallows observed. Helped readjust latch for depth. Reviewed engorgement care and monitoring voids/stools. Answered questions.  Patient Name: Molly Rojas [id number] Date: 07/24/2015 Reason for consult: Follow-up assessment   Maternal Data    Feeding Feeding Type: Breast Fed Length of feed: 15 min  LATCH Score/Interventions Latch: Grasps breast easily, tongue down, lips flanged, rhythmical sucking.  Audible Swallowing: A few with stimulation  Type of Nipple: Everted at rest and after stimulation  Comfort (Breast/Nipple): Soft / non-tender     Hold (Positioning): Assistance needed to correctly position infant at breast and maintain latch.  LATCH Score: 8  Lactation Tools Discussed/Used     Consult Status Consult Status: Complete    Hardie Pulley 07/24/2015, 9:12 AM

## 2015-07-25 MED ORDER — IBUPROFEN 600 MG PO TABS: 600.0000 mg | ORAL_TABLET | Freq: Four times a day (QID) | ORAL | Status: DC | PRN

## 2015-07-25 MED ORDER — BENZONATATE 200 MG PO CAPS: 200.0000 mg | ORAL_CAPSULE | Freq: Three times a day (TID) | ORAL | Status: DC

## 2015-07-25 MED ORDER — OXYCODONE-ACETAMINOPHEN 5-325 MG PO TABS: 1.0000 | ORAL_TABLET | ORAL | Status: DC | PRN

## 2015-07-25 NOTE — Discharge Summary (Signed)
Obstetric Discharge Summary Reason for Admission: cesarean section Prenatal Procedures: none Intrapartum Procedures: spontaneous vaginal delivery and cesarean: low cervical, transverse Postpartum Procedures: none Complications-Operative and Postpartum: none HEMOGLOBIN  Date Value Ref Range Status  07/23/2015 9.7* 12.0 - 15.0 g/dL Final    Comment:    DELTA CHECK NOTED REPEATED TO VERIFY    HCT  Date Value Ref Range Status  07/23/2015 29.8* 36.0 - 46.0 % Final    Physical Exam:  General: alert, cooperative, appears stated age and no distress Lochia: appropriate Uterine Fundus: firm Incision: healing well DVT Evaluation: No evidence of DVT seen on physical exam.  Discharge Diagnoses: Term Pregnancy-delivered  Discharge Information: Date: 07/25/2015 Activity: pelvic rest Diet: routine Medications: Ibuprofen and Percocet Condition: stable Instructions: refer to practice specific booklet Discharge to: home   Newborn Data: Live born female  Birth Weight: 7 lb 6 oz (3345 g) APGAR: 8, 9  Home with mother.  Rubina Basinski C 07/25/2015, 9:46 AM

## 2015-07-25 NOTE — Lactation Note (Addendum)
This note was copied from a baby's chart. Lactation Consultation Note Experienced BF mom BF her now 29 yr old for 3 months and had storage of milk for another month after that. Mom plans on BF longer, stated as long as she can with this baby. Has a bruise and scab to Rt. center nipple. Comfort gels given. Mom stated baby was cluster feeding and felt he must had slipped during the night and bruised that nipple. No bruising noted to Lt. Nipple. Moms breast are filling, hand expressed easy flow of transitional milk. Discussed engorgement, management, and prevention. Discussed clogged ducts, and prevention. Discussed pumping and storing milk properly. Reviewed support groups and resources in area. Doesn't have WIC. Encouraged STS, and reviewed positioning and obtaining deep latch. Mom has DEBP at home and a manual pump. Patient Name: Molly Rojas [id number] Date: 07/25/2015 Reason for consult: Follow-up assessment   Maternal Data    Feeding Feeding Type: Breast Fed Length of feed: 15 min  LATCH Score/Interventions Latch: Grasps breast easily, tongue down, lips flanged, rhythmical sucking.  Audible Swallowing: A few with stimulation Intervention(s): Hand expression;Skin to skin;Alternate breast massage  Type of Nipple: Everted at rest and after stimulation  Comfort (Breast/Nipple): Filling, red/small blisters or bruises, mild/mod discomfort  Problem noted: Cracked, bleeding, blisters, bruises Interventions  (Cracked/bleeding/bruising/blister): Expressed breast milk to nipple Interventions (Mild/moderate discomfort): Comfort gels;Hand massage;Hand expression  Hold (Positioning): No assistance needed to correctly position infant at breast. Intervention(s): Skin to skin;Position options;Support Pillows;Breastfeeding basics reviewed  LATCH Score: 9  Lactation Tools Discussed/Used Tools: Comfort gels WIC Program: No   Consult Status Consult Status: Complete Date: 07/25/15    Charyl Dancer 07/25/2015, 8:39 AM

## 2015-12-11 ENCOUNTER — Telehealth (HOSPITAL_COMMUNITY): Payer: Self-pay

## 2015-12-11 NOTE — Telephone Encounter (Signed)
Pt has noticed a severe decrease in her milk supply in the past 2 weeks.  She was pumping 2 oz at a time but now is pumping every 0.5 oz.  She is BF her 4 month and then Bottle feeding him until he is satisfied.  Encouraged her to increase pumping to every 2 hours during the day and every 3-4 hours at night. Hand expression recommended also.  May consider mother love supplement. Explained that it may take a few weeks for her supply to increase.

## 2016-04-20 ENCOUNTER — Inpatient Hospital Stay (HOSPITAL_COMMUNITY)
Admission: AD | Admit: 2016-04-20 | Discharge: 2016-04-20 | Disposition: A | Discharge status: Home / Self Care | Source: Ambulatory Visit | Attending: Obstetrics and Gynecology | Admitting: Obstetrics and Gynecology

## 2016-04-20 ENCOUNTER — Encounter (HOSPITAL_COMMUNITY): Payer: Self-pay | Admitting: *Deleted

## 2016-04-20 DIAGNOSIS — K429 Umbilical hernia without obstruction or gangrene: Secondary | ICD-10-CM | POA: Insufficient documentation

## 2016-04-20 DIAGNOSIS — Z3A18 18 weeks gestation of pregnancy: Secondary | ICD-10-CM | POA: Insufficient documentation

## 2016-04-20 DIAGNOSIS — R109 Unspecified abdominal pain: Secondary | ICD-10-CM | POA: Insufficient documentation

## 2016-04-20 DIAGNOSIS — K219 Gastro-esophageal reflux disease without esophagitis: Secondary | ICD-10-CM | POA: Diagnosis not present

## 2016-04-20 DIAGNOSIS — O99612 Diseases of the digestive system complicating pregnancy, second trimester: Secondary | ICD-10-CM | POA: Diagnosis not present

## 2016-04-20 DIAGNOSIS — O26892 Other specified pregnancy related conditions, second trimester: Secondary | ICD-10-CM | POA: Insufficient documentation

## 2016-04-20 HISTORY — DX: Umbilical hernia without obstruction or gangrene: K42.9

## 2016-04-20 LAB — URINALYSIS, ROUTINE W REFLEX MICROSCOPIC
Bilirubin Urine: NEGATIVE
Glucose, UA: NEGATIVE mg/dL
Hgb urine dipstick: NEGATIVE
KETONES UR: 15 mg/dL — AB
LEUKOCYTES UA: NEGATIVE
NITRITE: NEGATIVE
PH: 6 (ref 5.0–8.0)
Protein, ur: NEGATIVE mg/dL
Specific Gravity, Urine: <1.005 — ABNORMAL LOW (ref 1.005–1.030)

## 2016-04-20 MED ORDER — IBUPROFEN 600 MG PO TABS
600.0000 mg | ORAL_TABLET | Freq: Once | ORAL | Status: AC
  Administered 2016-04-20: 600 mg via ORAL
  Filled 2016-04-20: qty 1

## 2016-04-20 NOTE — MAU Note (Signed)
been having some severe pain.  Became really bad yesterday, unbearable today.  Is upper abd, sides and lower abd. Comes and goes.

## 2016-04-20 NOTE — Discharge Instructions (Signed)
Umbilical Hernia, Adult  A hernia is a bulge of tissue that pushes through an opening between muscles. An umbilical hernia happens in the abdomen, near the belly button (umbilicus). The hernia may contain tissues from the small intestine, large intestine, or fatty tissue covering the intestines (omentum). Umbilical hernias in adults tend to get worse over time, and they require surgical treatment.  There are several types of umbilical hernias. You may have:  · A hernia located just above or below the umbilicus (indirect hernia). This is the most common type of umbilical hernia in adults.  · A hernia that forms through an opening formed by the umbilicus (direct hernia).  · A hernia that comes and goes (reducible hernia). A reducible hernia may be visible only when you strain, lift something heavy, or cough. This type of hernia can be pushed back into the abdomen (reduced).  · A hernia that traps abdominal tissue inside the hernia (incarcerated hernia). This type of hernia cannot be reduced.  · A hernia that cuts off blood flow to the tissues inside the hernia (strangulated hernia). The tissues can start to die if this happens. This type of hernia requires emergency treatment.    What are the causes?  An umbilical hernia happens when tissue inside the abdomen presses on a weak area of the abdominal muscles.  What increases the risk?  You may have a greater risk of this condition if you:  · Are obese.  · Have had several pregnancies.  · Have a buildup of fluid inside your abdomen (ascites).  · Have had surgery that weakens the abdominal muscles.    What are the signs or symptoms?  The main symptom of this condition is a painless bulge at or near the belly button. A reducible hernia may be visible only when you strain, lift something heavy, or cough. Other symptoms may include:  · Dull pain.  · A feeling of pressure.    Symptoms of a strangulated hernia may include:  · Pain that gets increasingly worse.  · Nausea and  vomiting.  · Pain when pressing on the hernia.  · Skin over the hernia becoming red or purple.  · Constipation.  · Blood in the stool.    How is this diagnosed?  This condition may be diagnosed based on:  · A physical exam. You may be asked to cough or strain while standing. These actions increase the pressure inside your abdomen and force the hernia through the opening in your muscles. Your health care provider may try to reduce the hernia by pressing on it.  · Your symptoms and medical history.    How is this treated?  Surgery is the only treatment for an umbilical hernia. Surgery for a strangulated hernia is done as soon as possible. If you have a small hernia that is not incarcerated, you may need to lose weight before having surgery.  Follow these instructions at home:  · Lose weight, if told by your health care provider.  · Do not try to push the hernia back in.  · Watch your hernia for any changes in color or size. Tell your health care provider if any changes occur.  · You may need to avoid activities that increase pressure on your hernia.  · Do not lift anything that is heavier than 10 lb (4.5 kg) until your health care provider says that this is safe.  · Take over-the-counter and prescription medicines only as told by your health care provider.  ·   Keep all follow-up visits as told by your health care provider. This is important.  Contact a health care provider if:  · Your hernia gets larger.  · Your hernia becomes painful.  Get help right away if:  · You develop sudden, severe pain near the area of your hernia.  · You have pain as well as nausea or vomiting.  · You have pain and the skin over your hernia changes color.  · You develop a fever.  This information is not intended to replace advice given to you by your health care provider. Make sure you discuss any questions you have with your health care provider.  Document Released: 10/23/2015 Document Revised: 01/24/2016 Document Reviewed:  10/23/2015  Elsevier Interactive Patient Education © 2017 [employer]

## 2016-04-20 NOTE — MAU Provider Note (Signed)
History     CSN: [id number]  Arrival date and time: 04/20/16 1100   First Provider Initiated Contact with Patient 04/20/16 1200      Chief Complaint  Patient presents with  . Abdominal Pain   HPI   Ms.Tylene Fantasia.Zerline Rojas is a 30 y.o. female G3P2002 @ 3724w0d here in MAU with complaints of abdominal pain. The past started yesterday; the pain worsened today. The pain is located all over her abdomen. The pain increases with movement. She denies vaginal bleeding or leaking of fluid. She had pain similar to this pain in the past and that was due to her umbilical hernia. The hernia has been bulging for the past 2 days; she has been massaging it and has not been able to get it back inside. She noticed some color changes to the area. Since she has been In MAU the color changes are gone and the hernia is no longer felt.   OB History    Gravida Para Term Preterm AB Living   3 2 2  0 0 2   SAB TAB Ectopic Multiple Live Births   0 0 0 0 2      Past Medical History:  Diagnosis Date  . Abnormal Pap smear   . Anxiety   . GERD (gastroesophageal reflux disease)    with pregnancy   . Headache(784.0)   . HPV (human papilloma virus) infection   . No pertinent past medical history   . Scoliosis   . Scoliosis   . Umbilical hernia     Past Surgical History:  Procedure Laterality Date  . BACK SURGERY    . CESAREAN SECTION    . CESAREAN SECTION  03/28/2011   Procedure: CESAREAN SECTION;  Surgeon: Juluis MireJohn S McComb;  Location: [facility];  Service: Gynecology;  Laterality: N/A;  . CESAREAN SECTION N/A 07/22/2015   Procedure: CESAREAN SECTION;  Surgeon: Zelphia CairoGretchen Adkins, MD;  Location: [facility];  Service: Obstetrics;  Laterality: N/A;  Repeat edc 07/28/15 NKDA  . DENTAL SURGERY    . NO PAST SURGERIES    . SPINAL FUSION  placed rods from T-11-L4   for scoliosis, spinal fusion with screws and rods  . WISDOM TOOTH EXTRACTION      Family History  Problem Relation Age of Onset  . Diabetes Mother   .  Hyperlipidemia Father   . Diabetes Maternal Aunt   . Diabetes Maternal Uncle   . Diabetes Maternal Grandfather   . Hyperlipidemia Paternal Grandmother   . Heart disease Paternal Grandfather   . Anesthesia problems Neg Hx   . Hypotension Neg Hx   . Malignant hyperthermia Neg Hx   . Pseudochol deficiency Neg Hx     Social History  Substance Use Topics  . Smoking status: Never Smoker  . Smokeless tobacco: Never Used  . Alcohol use 0.0 oz/week     Comment: occasional     Allergies: No Known Allergies  Prescriptions Prior to Admission  Medication Sig Dispense Refill Last Dose  . acetaminophen (TYLENOL) 500 MG tablet Take 1,000 mg by mouth every 6 (six) hours as needed for mild pain.   04/19/2016 at Unknown time  . docusate sodium (COLACE) 100 MG capsule Take 100 mg by mouth daily as needed for mild constipation.   04/20/2016 at Unknown time  . polyethylene glycol (MIRALAX / GLYCOLAX) packet Take 17 g by mouth daily.    04/20/2016 at Unknown time  . benzonatate (TESSALON) 200 MG capsule Take 1 capsule (200 mg total) by  mouth 3 (three) times daily. (Patient not taking: Reported on 04/20/2016) 20 capsule 0 Not Taking at Unknown time  . calcium carbonate (TUMS - DOSED IN MG ELEMENTAL CALCIUM) 500 MG chewable tablet Chew 1-2 tablets by mouth as needed for indigestion or heartburn. Depends how bad indigestion is if takes 1 or 2 tablets   Not Taking at Unknown time  . ibuprofen (ADVIL,MOTRIN) 600 MG tablet Take 1 tablet (600 mg total) by mouth every 6 (six) hours as needed for mild pain. (Patient not taking: Reported on 04/20/2016) 30 tablet 0 Not Taking at Unknown time  . oxyCODONE-acetaminophen (PERCOCET/ROXICET) 5-325 MG tablet Take 1-2 tablets by mouth every 4 (four) hours as needed for moderate pain or severe pain. (Patient not taking: Reported on 04/20/2016) 30 tablet 0 Not Taking at Unknown time   Results for orders placed or performed during the hospital encounter of 04/20/16 (from the  past 48 hour(s))  Urinalysis, Routine w reflex microscopic (not [facility])     Status: Abnormal   Collection Time: 04/20/16 11:25 AM  Result Value Ref Range   Color, Urine YELLOW YELLOW   APPearance CLEAR CLEAR   Specific Gravity, Urine <1.005 (L) 1.005 - 1.030   pH 6.0 5.0 - 8.0   Glucose, UA NEGATIVE NEGATIVE mg/dL   Hgb urine dipstick NEGATIVE NEGATIVE   Bilirubin Urine NEGATIVE NEGATIVE   Ketones, ur 15 (A) NEGATIVE mg/dL   Protein, ur NEGATIVE NEGATIVE mg/dL   Nitrite NEGATIVE NEGATIVE   Leukocytes, UA NEGATIVE NEGATIVE    Comment: MICROSCOPIC NOT DONE ON URINES WITH NEGATIVE PROTEIN, BLOOD, LEUKOCYTES, NITRITE, OR GLUCOSE <1000 mg/dL.    Review of Systems  Constitutional: Negative for chills and fever.  Gastrointestinal: Positive for abdominal pain and nausea. Negative for constipation, diarrhea and vomiting.  Genitourinary: Negative for dysuria and urgency.   Physical Exam   Blood pressure (!) 111/53, pulse 64, temperature 98.1 F (36.7 C), temperature source Oral, resp. rate 16, height 5\' 1"  (1.549 m), weight 141 lb 9.6 oz (64.2 kg), unknown if currently breastfeeding.  Physical Exam  Constitutional: She appears well-developed and well-nourished. No distress.  GI: Soft. Normal appearance and bowel sounds are normal. She exhibits no mass. There is tenderness. There is no rigidity, no rebound and no guarding. A hernia (Umbilical, reducible. Tenderness with palpation to umbilical area. no cyanosis ) is present.  Skin: She is not diaphoretic.    MAU Course  Procedures  None  MDM + fetal heart tones Ibuprofen 600 mg PO Patient feels better and says she is ready to go home, hernia was reducible , and is feeling better.  Discussed patient with Dr. Rana SnareLowe.    Assessment and Plan   A:  1. Umbilical hernia without obstruction and without gangrene     P:  Discharge home in stable condition Abdominal-pregnancy support belt recommended If symptoms persist patient may  need referral to general surgery.  Call the office tomorrow for follow up Return to MAU if symptoms worsen  Duane LopeJennifer I Rasch, NP 04/20/2016 1:37 PM

## 2016-04-20 NOTE — MAU Note (Signed)
C/o abdominal pain and pt believes that it is her umbilical hernia that is causing the pain; hernia is larger and appears bluish in color; denies and diarrhea or constipation; has an OB appointment tomorrow but pain was so intense that she had to come for assessment today;

## 2016-04-26 DIAGNOSIS — K429 Umbilical hernia without obstruction or gangrene: Secondary | ICD-10-CM | POA: Insufficient documentation

## 2016-08-17 ENCOUNTER — Encounter (HOSPITAL_COMMUNITY): Payer: Self-pay | Admitting: *Deleted

## 2016-08-17 ENCOUNTER — Inpatient Hospital Stay (HOSPITAL_COMMUNITY)
Admission: AD | Admit: 2016-08-17 | Discharge: 2016-08-21 | DRG: 765 | Disposition: A | Discharge status: Home / Self Care | Source: Ambulatory Visit | Attending: Obstetrics and Gynecology | Admitting: Obstetrics and Gynecology

## 2016-08-17 DIAGNOSIS — Z833 Family history of diabetes mellitus: Secondary | ICD-10-CM

## 2016-08-17 DIAGNOSIS — Z302 Encounter for sterilization: Secondary | ICD-10-CM

## 2016-08-17 DIAGNOSIS — O9962 Diseases of the digestive system complicating childbirth: Secondary | ICD-10-CM | POA: Diagnosis present

## 2016-08-17 DIAGNOSIS — O479 False labor, unspecified: Secondary | ICD-10-CM | POA: Diagnosis present

## 2016-08-17 DIAGNOSIS — O4700 False labor before 37 completed weeks of gestation, unspecified trimester: Secondary | ICD-10-CM | POA: Diagnosis present

## 2016-08-17 DIAGNOSIS — Z8249 Family history of ischemic heart disease and other diseases of the circulatory system: Secondary | ICD-10-CM

## 2016-08-17 DIAGNOSIS — Z3A35 35 weeks gestation of pregnancy: Secondary | ICD-10-CM

## 2016-08-17 DIAGNOSIS — Z98891 History of uterine scar from previous surgery: Secondary | ICD-10-CM

## 2016-08-17 DIAGNOSIS — O34211 Maternal care for low transverse scar from previous cesarean delivery: Principal | ICD-10-CM | POA: Diagnosis present

## 2016-08-17 DIAGNOSIS — O4593 Premature separation of placenta, unspecified, third trimester: Secondary | ICD-10-CM | POA: Diagnosis present

## 2016-08-17 DIAGNOSIS — O34219 Maternal care for unspecified type scar from previous cesarean delivery: Secondary | ICD-10-CM

## 2016-08-17 DIAGNOSIS — K219 Gastro-esophageal reflux disease without esophagitis: Secondary | ICD-10-CM | POA: Diagnosis present

## 2016-08-17 HISTORY — DX: False labor before 37 completed weeks of gestation, unspecified trimester: O47.00

## 2016-08-17 LAB — URINALYSIS, ROUTINE W REFLEX MICROSCOPIC
Bilirubin Urine: NEGATIVE
Glucose, UA: NEGATIVE mg/dL
HGB URINE DIPSTICK: NEGATIVE
Ketones, ur: NEGATIVE mg/dL
LEUKOCYTES UA: NEGATIVE
Nitrite: NEGATIVE
PROTEIN: NEGATIVE mg/dL
SPECIFIC GRAVITY, URINE: 1.008 (ref 1.005–1.030)
pH: 5 (ref 5.0–8.0)

## 2016-08-17 LAB — CBC
HCT: 33.8 % — ABNORMAL LOW (ref 36.0–46.0)
HEMOGLOBIN: 11.4 g/dL — AB (ref 12.0–15.0)
MCH: 28.2 pg (ref 26.0–34.0)
MCHC: 33.7 g/dL (ref 30.0–36.0)
MCV: 83.7 fL (ref 78.0–100.0)
Platelets: 193 10*3/uL (ref 150–400)
RBC: 4.04 MIL/uL (ref 3.87–5.11)
RDW: 13.2 % (ref 11.5–15.5)
WBC: 12.9 10*3/uL — AB (ref 4.0–10.5)

## 2016-08-17 LAB — TYPE AND SCREEN
ABO/RH(D): O POS
ANTIBODY SCREEN: NEGATIVE

## 2016-08-17 MED ORDER — DOCUSATE SODIUM 100 MG PO CAPS
100.0000 mg | ORAL_CAPSULE | Freq: Every day | ORAL | Status: DC
  Administered 2016-08-18: 100 mg via ORAL
  Filled 2016-08-17 (×3): qty 1

## 2016-08-17 MED ORDER — LACTATED RINGERS IV SOLN: INTRAVENOUS | Status: DC

## 2016-08-17 MED ORDER — CALCIUM CARBONATE ANTACID 500 MG PO CHEW: 2.0000 | CHEWABLE_TABLET | ORAL | Status: DC | PRN

## 2016-08-17 MED ORDER — TERBUTALINE SULFATE 1 MG/ML IJ SOLN
0.2500 mg | Freq: Once | INTRAMUSCULAR | Status: AC | PRN
  Administered 2016-08-17: 0.25 mg via SUBCUTANEOUS
  Filled 2016-08-17: qty 1

## 2016-08-17 MED ORDER — ACETAMINOPHEN 325 MG PO TABS
650.0000 mg | ORAL_TABLET | ORAL | Status: DC | PRN
  Administered 2016-08-18: 650 mg via ORAL
  Filled 2016-08-17: qty 2

## 2016-08-17 MED ORDER — MAGNESIUM SULFATE BOLUS VIA INFUSION
4.0000 g | Freq: Once | INTRAVENOUS | Status: AC
  Administered 2016-08-17: 4 g via INTRAVENOUS
  Filled 2016-08-17: qty 500

## 2016-08-17 MED ORDER — BETAMETHASONE SOD PHOS & ACET 6 (3-3) MG/ML IJ SUSP
12.0000 mg | INTRAMUSCULAR | Status: AC
  Administered 2016-08-17 – 2016-08-18 (×2): 12 mg via INTRAMUSCULAR
  Filled 2016-08-17 (×2): qty 2

## 2016-08-17 MED ORDER — BUTORPHANOL TARTRATE 1 MG/ML IJ SOLN
1.0000 mg | Freq: Once | INTRAMUSCULAR | Status: AC
  Administered 2016-08-17: 1 mg via INTRAVENOUS
  Filled 2016-08-17: qty 1

## 2016-08-17 MED ORDER — PRENATAL MULTIVITAMIN CH
1.0000 | ORAL_TABLET | Freq: Every day | ORAL | Status: DC
  Administered 2016-08-18: 1 via ORAL
  Filled 2016-08-17 (×2): qty 1

## 2016-08-17 MED ORDER — LACTATED RINGERS IV BOLUS (SEPSIS)
500.0000 mL | Freq: Once | INTRAVENOUS | Status: AC
  Administered 2016-08-17: 500 mL via INTRAVENOUS

## 2016-08-17 MED ORDER — NIFEDIPINE 10 MG PO CAPS
10.0000 mg | ORAL_CAPSULE | Freq: Once | ORAL | Status: AC
  Administered 2016-08-17: 10 mg via ORAL
  Filled 2016-08-17: qty 1

## 2016-08-17 MED ORDER — MAGNESIUM SULFATE 50 % IJ SOLN
2.0000 g/h | INTRAVENOUS | Status: DC
  Administered 2016-08-18: 2 g/h via INTRAVENOUS
  Filled 2016-08-17 (×2): qty 80

## 2016-08-17 MED ORDER — LACTATED RINGERS IV SOLN
INTRAVENOUS | Status: DC
  Administered 2016-08-17 – 2016-08-18 (×5): via INTRAVENOUS

## 2016-08-17 MED ORDER — ZOLPIDEM TARTRATE 5 MG PO TABS: 5.0000 mg | ORAL_TABLET | Freq: Every evening | ORAL | Status: DC | PRN

## 2016-08-17 NOTE — MAU Provider Note (Signed)
MSE complete. Dr. Vincente PoliGrewal in MAU managing patient's visit. RN to call Her for further plan of care.    Duane LopeJennifer I Nyari Olsson, NP 08/17/2016 4:37 PM

## 2016-08-17 NOTE — H&P (Signed)
Molly Rojas is a 31 y.o. G 3 P 2002 at 30 weeks presents to office with uterine contractions. Evaluated in triage - no response with nifedipine , stadol  Cervix long and closed  Patient has history of C Section x 2 OB History    Gravida Para Term Preterm AB Living   3 2 2  0 0 2   SAB TAB Ectopic Multiple Live Births   0 0 0 0 2     Past Medical History:  Diagnosis Date  . Abnormal Pap smear   . Anxiety   . GERD (gastroesophageal reflux disease)    with pregnancy   . Headache(784.0)   . HPV (human papilloma virus) infection   . No pertinent past medical history   . Scoliosis   . Scoliosis   . Umbilical hernia    Past Surgical History:  Procedure Laterality Date  . BACK SURGERY    . CESAREAN SECTION    . CESAREAN SECTION  03/28/2011   Procedure: CESAREAN SECTION;  Surgeon: Juluis MireJohn S McComb;  Location: [facility];  Service: Gynecology;  Laterality: N/A;  . CESAREAN SECTION N/A 07/22/2015   Procedure: CESAREAN SECTION;  Surgeon: Zelphia CairoGretchen Adkins, MD;  Location: [facility];  Service: Obstetrics;  Laterality: N/A;  Repeat edc 07/28/15 NKDA  . DENTAL SURGERY    . NO PAST SURGERIES    . SPINAL FUSION  placed rods from T-11-L4   for scoliosis, spinal fusion with screws and rods  . WISDOM TOOTH EXTRACTION     Family History: family history includes Diabetes in her maternal aunt, maternal grandfather, maternal uncle, and mother; Heart disease in her paternal grandfather; Hyperlipidemia in her father and paternal grandmother; Hypertension in her father; Thyroid disease in her mother. Social History:  reports that she has never smoked. She has never used smokeless tobacco. She reports that she drinks alcohol. She reports that she does not use drugs.     Maternal Diabetes: No Genetic Screening: Normal Maternal Ultrasounds/Referrals: Normal Fetal Ultrasounds or other Referrals:  None Maternal Substance Abuse:  No Significant Maternal Medications:  None Significant Maternal Lab Results:   None Other Comments:  None  Review of Systems  All other systems reviewed and are negative.  Maternal Medical History:  Reason for admission: Contractions.     Dilation: Closed Exam by:: Dr Vincente PoliGrewal Blood pressure 115/78, pulse 81, temperature 97.9 F (36.6 C), temperature source Oral, resp. rate 20, unknown if currently breastfeeding. Maternal Exam:  Abdomen: Fetal presentation: vertex     Fetal Exam Fetal State Assessment: Category I - tracings are normal.     Physical Exam  Nursing note and vitals reviewed. Constitutional: She appears well-developed and well-nourished.  HENT:  Head: Normocephalic and atraumatic.  Eyes: Pupils are equal, round, and reactive to light.  Neck: Normal range of motion.  Cardiovascular: Normal rate and regular rhythm.   Respiratory: Effort normal and breath sounds normal.    Prenatal labs: ABO, Rh:   Antibody:   Rubella:   RPR:    HBsAg:    HIV:    GBS:     Assessment/Plan: IUP at 30 weeks Pre term Contractions C Section x 2 Admit Terbutaline Magnesium sulfate Steroids Plan discussed with patient   Jlynn Ly L 08/17/2016, 6:04 PM

## 2016-08-17 NOTE — MAU Note (Signed)
Pt presents with c/o regular ctxs since yesterday @ 1300.  Pt denies LOF or bloody discharge.  Pt reports good FM.

## 2016-08-18 ENCOUNTER — Observation Stay (HOSPITAL_COMMUNITY)

## 2016-08-18 ENCOUNTER — Encounter (HOSPITAL_COMMUNITY)
Admission: AD | Disposition: A | Discharge status: Home / Self Care | Payer: Self-pay | Source: Ambulatory Visit | Attending: Obstetrics and Gynecology

## 2016-08-18 ENCOUNTER — Observation Stay (HOSPITAL_COMMUNITY): Admitting: Certified Registered Nurse Anesthetist

## 2016-08-18 ENCOUNTER — Encounter (HOSPITAL_COMMUNITY): Payer: Self-pay | Admitting: Certified Registered Nurse Anesthetist

## 2016-08-18 DIAGNOSIS — O34211 Maternal care for low transverse scar from previous cesarean delivery: Secondary | ICD-10-CM | POA: Diagnosis present

## 2016-08-18 DIAGNOSIS — Z3A35 35 weeks gestation of pregnancy: Secondary | ICD-10-CM | POA: Diagnosis not present

## 2016-08-18 DIAGNOSIS — O4593 Premature separation of placenta, unspecified, third trimester: Secondary | ICD-10-CM | POA: Diagnosis present

## 2016-08-18 DIAGNOSIS — Z8249 Family history of ischemic heart disease and other diseases of the circulatory system: Secondary | ICD-10-CM | POA: Diagnosis not present

## 2016-08-18 DIAGNOSIS — Z833 Family history of diabetes mellitus: Secondary | ICD-10-CM | POA: Diagnosis not present

## 2016-08-18 DIAGNOSIS — O9962 Diseases of the digestive system complicating childbirth: Secondary | ICD-10-CM | POA: Diagnosis present

## 2016-08-18 DIAGNOSIS — Z302 Encounter for sterilization: Secondary | ICD-10-CM | POA: Diagnosis not present

## 2016-08-18 DIAGNOSIS — K219 Gastro-esophageal reflux disease without esophagitis: Secondary | ICD-10-CM | POA: Diagnosis present

## 2016-08-18 LAB — CBC
HEMATOCRIT: 30.4 % — AB (ref 36.0–46.0)
Hemoglobin: 10.1 g/dL — ABNORMAL LOW (ref 12.0–15.0)
MCH: 28.3 pg (ref 26.0–34.0)
MCHC: 33.2 g/dL (ref 30.0–36.0)
MCV: 85.2 fL (ref 78.0–100.0)
Platelets: 187 10*3/uL (ref 150–400)
RBC: 3.57 MIL/uL — ABNORMAL LOW (ref 3.87–5.11)
RDW: 13.6 % (ref 11.5–15.5)
WBC: 12.3 10*3/uL — AB (ref 4.0–10.5)

## 2016-08-18 SURGERY — Surgical Case
Anesthesia: Spinal

## 2016-08-18 MED ORDER — SODIUM CHLORIDE 0.9% FLUSH: 3.0000 mL | INTRAVENOUS | Status: DC | PRN

## 2016-08-18 MED ORDER — ONDANSETRON HCL 4 MG/2ML IJ SOLN: 4.0000 mg | Freq: Three times a day (TID) | INTRAMUSCULAR | Status: DC | PRN

## 2016-08-18 MED ORDER — SIMETHICONE 80 MG PO CHEW
80.0000 mg | CHEWABLE_TABLET | Freq: Three times a day (TID) | ORAL | Status: DC
  Administered 2016-08-19 – 2016-08-21 (×7): 80 mg via ORAL
  Filled 2016-08-18 (×7): qty 1

## 2016-08-18 MED ORDER — SODIUM CHLORIDE 0.9% FLUSH: 3.0000 mL | Freq: Two times a day (BID) | INTRAVENOUS | Status: DC

## 2016-08-18 MED ORDER — NALBUPHINE HCL 10 MG/ML IJ SOLN: 5.0000 mg | Freq: Once | INTRAMUSCULAR | Status: DC | PRN

## 2016-08-18 MED ORDER — TETANUS-DIPHTH-ACELL PERTUSSIS 5-2.5-18.5 LF-MCG/0.5 IM SUSP: 0.5000 mL | Freq: Once | INTRAMUSCULAR | Status: DC

## 2016-08-18 MED ORDER — OXYCODONE-ACETAMINOPHEN 5-325 MG PO TABS: 1.0000 | ORAL_TABLET | ORAL | Status: DC | PRN

## 2016-08-18 MED ORDER — MEPERIDINE HCL 25 MG/ML IJ SOLN
6.2500 mg | INTRAMUSCULAR | Status: DC | PRN
Start: 2016-08-18 — End: 2016-08-18

## 2016-08-18 MED ORDER — FLEET ENEMA 7-19 GM/118ML RE ENEM: 1.0000 | ENEMA | Freq: Every day | RECTAL | Status: DC | PRN

## 2016-08-18 MED ORDER — DIPHENHYDRAMINE HCL 25 MG PO CAPS: 25.0000 mg | ORAL_CAPSULE | ORAL | Status: DC | PRN

## 2016-08-18 MED ORDER — PRENATAL MULTIVITAMIN CH
1.0000 | ORAL_TABLET | Freq: Every day | ORAL | Status: DC
  Administered 2016-08-19 – 2016-08-21 (×3): 1 via ORAL
  Filled 2016-08-18 (×3): qty 1

## 2016-08-18 MED ORDER — SOD CITRATE-CITRIC ACID 500-334 MG/5ML PO SOLN
ORAL | Status: AC
  Administered 2016-08-18: 30 mL
  Filled 2016-08-18: qty 15

## 2016-08-18 MED ORDER — DEXAMETHASONE SODIUM PHOSPHATE 4 MG/ML IJ SOLN
INTRAMUSCULAR | Status: DC | PRN
  Administered 2016-08-18: 4 mg via INTRAVENOUS

## 2016-08-18 MED ORDER — EPHEDRINE SULFATE 50 MG/ML IJ SOLN
INTRAMUSCULAR | Status: DC | PRN
  Administered 2016-08-18: 5 mg via INTRAVENOUS

## 2016-08-18 MED ORDER — HYDROMORPHONE HCL 1 MG/ML IJ SOLN
INTRAMUSCULAR | Status: AC
  Filled 2016-08-18: qty 1

## 2016-08-18 MED ORDER — NALBUPHINE HCL 10 MG/ML IJ SOLN: 5.0000 mg | INTRAMUSCULAR | Status: DC | PRN

## 2016-08-18 MED ORDER — BUPIVACAINE IN DEXTROSE 0.75-8.25 % IT SOLN
INTRATHECAL | Status: DC | PRN
  Administered 2016-08-18: 1.4 mL via INTRATHECAL

## 2016-08-18 MED ORDER — MORPHINE SULFATE (PF) 0.5 MG/ML IJ SOLN
INTRAMUSCULAR | Status: DC | PRN
  Administered 2016-08-18: .2 mg via INTRATHECAL

## 2016-08-18 MED ORDER — OXYTOCIN 10 UNIT/ML IJ SOLN
INTRAMUSCULAR | Status: AC
Start: 2016-08-18 — End: 2016-08-18
  Filled 2016-08-18: qty 4

## 2016-08-18 MED ORDER — CEFAZOLIN IN D5W 1 GM/50ML IV SOLN
INTRAVENOUS | Status: DC | PRN
  Administered 2016-08-18: 2 g via INTRAVENOUS

## 2016-08-18 MED ORDER — KETOROLAC TROMETHAMINE 30 MG/ML IJ SOLN: 30.0000 mg | Freq: Once | INTRAMUSCULAR | Status: DC

## 2016-08-18 MED ORDER — SODIUM CHLORIDE 0.9 % IR SOLN
Status: DC | PRN
Start: 2016-08-18 — End: 2016-08-18
  Administered 2016-08-18: 1

## 2016-08-18 MED ORDER — DIPHENHYDRAMINE HCL 25 MG PO CAPS: 25.0000 mg | ORAL_CAPSULE | Freq: Four times a day (QID) | ORAL | Status: DC | PRN

## 2016-08-18 MED ORDER — ONDANSETRON HCL 4 MG/2ML IJ SOLN
INTRAMUSCULAR | Status: AC
  Filled 2016-08-18: qty 2

## 2016-08-18 MED ORDER — KETOROLAC TROMETHAMINE 30 MG/ML IJ SOLN: 30.0000 mg | Freq: Four times a day (QID) | INTRAMUSCULAR | Status: DC | PRN

## 2016-08-18 MED ORDER — KETOROLAC TROMETHAMINE 30 MG/ML IJ SOLN
INTRAMUSCULAR | Status: AC
  Filled 2016-08-18: qty 1

## 2016-08-18 MED ORDER — MEASLES, MUMPS & RUBELLA VAC ~~LOC~~ INJ
0.5000 mL | INJECTION | Freq: Once | SUBCUTANEOUS | Status: DC
  Filled 2016-08-18: qty 0.5

## 2016-08-18 MED ORDER — SODIUM CHLORIDE 0.9 % IV SOLN: 250.0000 mL | INTRAVENOUS | Status: DC

## 2016-08-18 MED ORDER — PROMETHAZINE HCL 25 MG/ML IJ SOLN: 12.5000 mg | Freq: Once | INTRAMUSCULAR | Status: DC | PRN

## 2016-08-18 MED ORDER — BISACODYL 10 MG RE SUPP: 10.0000 mg | Freq: Every day | RECTAL | Status: DC | PRN

## 2016-08-18 MED ORDER — WITCH HAZEL-GLYCERIN EX PADS: 1.0000 "application " | MEDICATED_PAD | CUTANEOUS | Status: DC | PRN

## 2016-08-18 MED ORDER — SIMETHICONE 80 MG PO CHEW: 80.0000 mg | CHEWABLE_TABLET | ORAL | Status: DC | PRN

## 2016-08-18 MED ORDER — NALOXONE HCL 0.4 MG/ML IJ SOLN: 0.4000 mg | INTRAMUSCULAR | Status: DC | PRN

## 2016-08-18 MED ORDER — ONDANSETRON HCL 4 MG/2ML IJ SOLN
INTRAMUSCULAR | Status: DC | PRN
  Administered 2016-08-18: 4 mg via INTRAVENOUS

## 2016-08-18 MED ORDER — ACETAMINOPHEN 500 MG PO TABS
1000.0000 mg | ORAL_TABLET | Freq: Four times a day (QID) | ORAL | Status: AC
  Administered 2016-08-19 (×3): 1000 mg via ORAL
  Filled 2016-08-18 (×3): qty 2

## 2016-08-18 MED ORDER — LACTATED RINGERS IV SOLN
INTRAVENOUS | Status: DC | PRN
  Administered 2016-08-18: 19:00:00 via INTRAVENOUS

## 2016-08-18 MED ORDER — COCONUT OIL OIL: 1.0000 "application " | TOPICAL_OIL | Status: DC | PRN

## 2016-08-18 MED ORDER — MEPERIDINE HCL 25 MG/ML IJ SOLN: 6.2500 mg | INTRAMUSCULAR | Status: DC | PRN

## 2016-08-18 MED ORDER — OXYTOCIN 40 UNITS IN LACTATED RINGERS INFUSION - SIMPLE MED: 2.5000 [IU]/h | INTRAVENOUS | Status: AC

## 2016-08-18 MED ORDER — SENNOSIDES-DOCUSATE SODIUM 8.6-50 MG PO TABS
2.0000 | ORAL_TABLET | ORAL | Status: DC
  Administered 2016-08-19 – 2016-08-20 (×3): 2 via ORAL
  Filled 2016-08-18 (×3): qty 2

## 2016-08-18 MED ORDER — ZOLPIDEM TARTRATE 5 MG PO TABS: 5.0000 mg | ORAL_TABLET | Freq: Every evening | ORAL | Status: DC | PRN

## 2016-08-18 MED ORDER — ACETAMINOPHEN 325 MG PO TABS: 650.0000 mg | ORAL_TABLET | ORAL | Status: DC | PRN

## 2016-08-18 MED ORDER — IBUPROFEN 800 MG PO TABS
800.0000 mg | ORAL_TABLET | Freq: Three times a day (TID) | ORAL | Status: DC | PRN
  Administered 2016-08-19 (×2): 800 mg via ORAL
  Filled 2016-08-18 (×2): qty 1

## 2016-08-18 MED ORDER — IBUPROFEN 600 MG PO TABS
600.0000 mg | ORAL_TABLET | Freq: Four times a day (QID) | ORAL | Status: DC | PRN
Start: 2016-08-18 — End: 2016-08-21
  Administered 2016-08-19 – 2016-08-21 (×4): 600 mg via ORAL
  Filled 2016-08-18 (×3): qty 1

## 2016-08-18 MED ORDER — DEXTROSE IN LACTATED RINGERS 5 % IV SOLN: INTRAVENOUS | Status: DC

## 2016-08-18 MED ORDER — FENTANYL CITRATE (PF) 100 MCG/2ML IJ SOLN
INTRAMUSCULAR | Status: DC | PRN
  Administered 2016-08-18: 20 ug via INTRATHECAL

## 2016-08-18 MED ORDER — PHENYLEPHRINE 8 MG IN D5W 100 ML (0.08MG/ML) PREMIX OPTIME
INJECTION | INTRAVENOUS | Status: DC | PRN
  Administered 2016-08-18: 60 ug/min via INTRAVENOUS

## 2016-08-18 MED ORDER — DIPHENHYDRAMINE HCL 50 MG/ML IJ SOLN
12.5000 mg | INTRAMUSCULAR | Status: DC | PRN
  Administered 2016-08-18: 12.5 mg via INTRAVENOUS

## 2016-08-18 MED ORDER — HYDROMORPHONE HCL 1 MG/ML IJ SOLN
0.2500 mg | INTRAMUSCULAR | Status: DC | PRN
  Administered 2016-08-18: 0.5 mg via INTRAVENOUS

## 2016-08-18 MED ORDER — SCOPOLAMINE 1 MG/3DAYS TD PT72
1.0000 | MEDICATED_PATCH | Freq: Once | TRANSDERMAL | Status: DC
  Administered 2016-08-18: 1.5 mg via TRANSDERMAL

## 2016-08-18 MED ORDER — EPHEDRINE 5 MG/ML INJ
INTRAVENOUS | Status: AC
  Filled 2016-08-18: qty 10

## 2016-08-18 MED ORDER — OXYTOCIN 10 UNIT/ML IJ SOLN
INTRAVENOUS | Status: DC | PRN
  Administered 2016-08-18: 40 [IU] via INTRAVENOUS

## 2016-08-18 MED ORDER — CEFAZOLIN SODIUM-DEXTROSE 2-4 GM/100ML-% IV SOLN
INTRAVENOUS | Status: AC
  Filled 2016-08-18: qty 100

## 2016-08-18 MED ORDER — DIBUCAINE 1 % RE OINT: 1.0000 "application " | TOPICAL_OINTMENT | RECTAL | Status: DC | PRN

## 2016-08-18 MED ORDER — NALOXONE HCL 2 MG/2ML IJ SOSY
1.0000 ug/kg/h | PREFILLED_SYRINGE | INTRAVENOUS | Status: DC | PRN
  Filled 2016-08-18: qty 2

## 2016-08-18 MED ORDER — DIPHENHYDRAMINE HCL 50 MG/ML IJ SOLN
INTRAMUSCULAR | Status: AC
  Filled 2016-08-18: qty 1

## 2016-08-18 MED ORDER — MENTHOL 3 MG MT LOZG
1.0000 | LOZENGE | OROMUCOSAL | Status: DC | PRN
Start: 2016-08-18 — End: 2016-08-21

## 2016-08-18 MED ORDER — FENTANYL CITRATE (PF) 100 MCG/2ML IJ SOLN
INTRAMUSCULAR | Status: AC
Start: 2016-08-18 — End: 2016-08-18
  Filled 2016-08-18: qty 2

## 2016-08-18 MED ORDER — MORPHINE SULFATE (PF) 0.5 MG/ML IJ SOLN
INTRAMUSCULAR | Status: AC
  Filled 2016-08-18: qty 10

## 2016-08-18 MED ORDER — SCOPOLAMINE 1 MG/3DAYS TD PT72
MEDICATED_PATCH | TRANSDERMAL | Status: AC
  Filled 2016-08-18: qty 1

## 2016-08-18 MED ORDER — SIMETHICONE 80 MG PO CHEW
80.0000 mg | CHEWABLE_TABLET | ORAL | Status: DC
  Administered 2016-08-19 – 2016-08-20 (×3): 80 mg via ORAL
  Filled 2016-08-18 (×3): qty 1

## 2016-08-18 MED ORDER — OXYCODONE-ACETAMINOPHEN 5-325 MG PO TABS
2.0000 | ORAL_TABLET | ORAL | Status: DC | PRN
  Filled 2016-08-18: qty 2

## 2016-08-18 SURGICAL SUPPLY — 31 items
BENZOIN TINCTURE PRP APPL 2/3 (GAUZE/BANDAGES/DRESSINGS) ×3 IMPLANT
CHLORAPREP W/TINT 26ML (MISCELLANEOUS) ×3 IMPLANT
CLAMP CORD UMBIL (MISCELLANEOUS) IMPLANT
CLOSURE STERI STRIP 1/2 X4 (GAUZE/BANDAGES/DRESSINGS) ×3 IMPLANT
CLOSURE WOUND 1/2 X4 (GAUZE/BANDAGES/DRESSINGS) ×2
CLOTH BEACON ORANGE TIMEOUT ST (SAFETY) ×3 IMPLANT
DRSG OPSITE POSTOP 4X10 (GAUZE/BANDAGES/DRESSINGS) ×3 IMPLANT
ELECT REM PT RETURN 9FT ADLT (ELECTROSURGICAL) ×3
ELECTRODE REM PT RTRN 9FT ADLT (ELECTROSURGICAL) ×1 IMPLANT
EXTRACTOR VACUUM M CUP 4 TUBE (SUCTIONS) IMPLANT
EXTRACTOR VACUUM M CUP 4' TUBE (SUCTIONS)
GLOVE BIO SURGEON STRL SZ7 (GLOVE) ×3 IMPLANT
GLOVE BIOGEL PI IND STRL 7.0 (GLOVE) ×2 IMPLANT
GLOVE BIOGEL PI INDICATOR 7.0 (GLOVE) ×4
GOWN STRL REUS W/TWL LRG LVL3 (GOWN DISPOSABLE) ×6 IMPLANT
KIT ABG SYR 3ML LUER SLIP (SYRINGE) IMPLANT
NEEDLE HYPO 25X5/8 SAFETYGLIDE (NEEDLE) ×3 IMPLANT
NS IRRIG 1000ML POUR BTL (IV SOLUTION) ×3 IMPLANT
PACK C SECTION WH (CUSTOM PROCEDURE TRAY) ×3 IMPLANT
PAD ABD 7.5X8 STRL (GAUZE/BANDAGES/DRESSINGS) ×3 IMPLANT
PAD OB MATERNITY 4.3X12.25 (PERSONAL CARE ITEMS) ×3 IMPLANT
PENCIL SMOKE EVAC W/HOLSTER (ELECTROSURGICAL) ×3 IMPLANT
SPONGE GAUZE 4X4 12PLY STER LF (GAUZE/BANDAGES/DRESSINGS) ×3 IMPLANT
STRIP CLOSURE SKIN 1/2X4 (GAUZE/BANDAGES/DRESSINGS) ×4 IMPLANT
SUT CHROMIC 0 CTX 36 (SUTURE) ×15 IMPLANT
SUT MON AB 4-0 PS1 27 (SUTURE) ×3 IMPLANT
SUT PDS AB 0 CT1 27 (SUTURE) ×6 IMPLANT
SUT VIC AB 3-0 CT1 27 (SUTURE) ×4
SUT VIC AB 3-0 CT1 TAPERPNT 27 (SUTURE) ×2 IMPLANT
TOWEL OR 17X24 6PK STRL BLUE (TOWEL DISPOSABLE) ×3 IMPLANT
TRAY FOLEY CATH SILVER 14FR (SET/KITS/TRAYS/PACK) ×3 IMPLANT

## 2016-08-18 NOTE — Progress Notes (Signed)
Received phone report from US and discussed w/ MFM>>>US showed large abruption @ 8727w1d, rec proceed with RCS>>proced + risks discussed, pt prefers BTL, permanence + failure rate of 2-3 /1000 reviewed.

## 2016-08-18 NOTE — Progress Notes (Signed)
Removed from efm to go to operating room

## 2016-08-18 NOTE — Progress Notes (Signed)
Doppler after epidural placed in Operating room

## 2016-08-18 NOTE — Anesthesia Procedure Notes (Signed)
Spinal  Patient location during procedure: OR Start time: 08/18/2016 6:55 PM End time: 08/18/2016 7:02 PM Staffing Anesthesiologist: Leilani AbleHATCHETT, Daimen Shovlin Performed: anesthesiologist  Preanesthetic Checklist Completed: patient identified, surgical consent, pre-op evaluation, timeout performed, IV checked, risks and benefits discussed and monitors and equipment checked Spinal Block Patient position: sitting Prep: site prepped and draped and DuraPrep Patient monitoring: heart rate, cardiac monitor, continuous pulse ox and blood pressure Approach: midline Location: L3-4 Injection technique: single-shot Needle Needle type: Pencan  Needle gauge: 24 G Needle length: 9 cm Needle insertion depth: 6 cm Assessment Sensory level: T4 Events: paresthesia Additional Notes R leg X 3. Injected only after pt stated that paresthesia had subsided

## 2016-08-18 NOTE — Anesthesia Preprocedure Evaluation (Signed)
Anesthesia Evaluation  Patient identified by MRN, date of birth, ID band Patient awake    Reviewed: Allergy & Precautions, H&P , NPO status , Patient's Chart, lab work & pertinent test results  Airway Mallampati: I  TM Distance: >3 FB Neck ROM: full    Dental no notable dental hx.    Pulmonary neg pulmonary ROS,    Pulmonary exam normal        Cardiovascular negative cardio ROS Normal cardiovascular exam     Neuro/Psych    GI/Hepatic Neg liver ROS,   Endo/Other  negative endocrine ROS  Renal/GU negative Renal ROS     Musculoskeletal   Abdominal (+) + obese,   Peds  Hematology negative hematology ROS (+)   Anesthesia Other Findings   Reproductive/Obstetrics (+) Pregnancy                             Anesthesia Physical  Anesthesia Plan  ASA: II  Anesthesia Plan: Spinal   Post-op Pain Management:    Induction:   Airway Management Planned:   Additional Equipment:   Intra-op Plan:   Post-operative Plan:   Informed Consent: I have reviewed the patients History and Physical, chart, labs and discussed the procedure including the risks, benefits and alternatives for the proposed anesthesia with the patient or authorized representative who has indicated his/her understanding and acceptance.     Plan Discussed with: CRNA and Surgeon  Anesthesia Plan Comments:         Anesthesia Quick Evaluation

## 2016-08-18 NOTE — Progress Notes (Signed)
Off the monitor for ultrasound.

## 2016-08-18 NOTE — Transfer of Care (Signed)
Immediate Anesthesia Transfer of Care Note  Patient: Molly Rojas  Procedure(s) Performed: Procedure(s): CESAREAN SECTION (N/A)  Patient Location: PACU  Anesthesia Type:Spinal  Level of Consciousness: awake, alert , oriented and patient cooperative  Airway & Oxygen Therapy: Patient Spontanous Breathing  Post-op Assessment: Report given to RN and Post -op Vital signs reviewed and stable  Post vital signs: Reviewed and stable  Last Vitals:  Vitals:   08/18/16 1555 08/18/16 1836  BP: 104/64 115/71  Pulse: 97 (!) 108  Resp: 16   Temp: 36.9 C 36.7 C    Last Pain:  Vitals:   08/18/16 1555  TempSrc: Oral  PainSc:       Patients Stated Pain Goal: 3 (08/18/16 0830)  Complications: No apparent anesthesia complications

## 2016-08-18 NOTE — Progress Notes (Signed)
8422w1d  S//  uncomft trying to rest last pm, irreg ctx O//BP 114/70 (BP Location: Right Arm)   Pulse 88   Temp 97.9 F (36.6 C) (Oral)   Resp 16   Ht 5\' 1"  (1.549 m)   Wt 74.3 kg (163 lb 12 oz)   SpO2 98%   BMI 30.94 kg/m   FHR 148, fundus soft  A+P//422w1d  PTL Prior CS Cont MgSO4 until second BMZ [place]

## 2016-08-18 NOTE — Op Note (Signed)
Preoperative diagnosis: 35+ week gestation, previous cesarean section, preterm labor, concealed abruption, request permanent sterilization  Postoperative diagnosis: Same  Procedure: Repeat low transverse cesarean section, lysis of adhesions, Filshie clip tubal ligation  Surgeon: Marcelle OverlieHolland  Anesthesia: Spinal  EBL: 800 cc  Procedure and findings:  Patient taken the operating room after an adequate level of spinal anesthetic was obtained with the patient in left tilt position the abdomen prepped and draped in the usual manner over catheter position appropriate timeouts were taken at that point. Transverse incision made through the old scar actually excising it, this is carried down the fascia which was incised and extended transversely. Rectus muscle divided in the midline peritoneum entered superiorly without incident. She had some significant lower uterine to anterior abdominal wall adhesions which were slowly lysed in an avascular plane once this was accomplished the bladder blade was inserted transverse incision made lower segment extended with blunt dissection clear fluid was then noted the patient delivered of a vigorous infant, the infant was suctioned cord clamped and passed the pediatric team for further care. Placenta was then delivered manually intact, there was approximately 20% o'clock consistent with abruption at the upper end of the placenta. This was sent to pathology. Uterus exteriorized, cavity wiped clean with laparotomy pack closure obtained the first layer of 0 chromic in a locked fashion followed by an imbricating layer of 0 chromic. This was hemostatic bladder flap area was intact and hemostatic clear urine noted at that point. Bilateral tubes and ovaries were normal Filshie clip was applied 2 cm from the cornu on each side at a right angle with excellent application on each side. Prior to closure sponge, needle, history counts reported as correct 2. Peritoneum closed with a 2-0  Vicryl suture. The same used to reapproximate the rectus muscles in the midline. 0 PDS was then used to close the fascia laterally septated tissue was undermined to reduce tension this is made hemostatic with the Bovie was not closed separately 4-0 Monocryl subarticular skin closure with Steri-Strips and a pressure dressing. Again, clear urine noted in the case. She tolerated this well went to recovery room in good condition.  Dictated with [facility]  Glennda Weatherholtz Milana ObeyM Alithia Zavaleta M.D.

## 2016-08-19 ENCOUNTER — Encounter (HOSPITAL_COMMUNITY): Payer: Self-pay | Admitting: Obstetrics and Gynecology

## 2016-08-19 LAB — CBC
HCT: 25.4 % — ABNORMAL LOW (ref 36.0–46.0)
Hemoglobin: 8.5 g/dL — ABNORMAL LOW (ref 12.0–15.0)
MCH: 28.5 pg (ref 26.0–34.0)
MCHC: 33.5 g/dL (ref 30.0–36.0)
MCV: 85.2 fL (ref 78.0–100.0)
PLATELETS: 152 10*3/uL (ref 150–400)
RBC: 2.98 MIL/uL — ABNORMAL LOW (ref 3.87–5.11)
RDW: 13.8 % (ref 11.5–15.5)
WBC: 15.5 10*3/uL — AB (ref 4.0–10.5)

## 2016-08-19 MED ORDER — HYDROCODONE-ACETAMINOPHEN 5-325 MG PO TABS
1.0000 | ORAL_TABLET | ORAL | Status: DC | PRN
  Administered 2016-08-19: 1 via ORAL
  Administered 2016-08-19 – 2016-08-20 (×3): 2 via ORAL
  Administered 2016-08-20: 1 via ORAL
  Administered 2016-08-20 – 2016-08-21 (×5): 2 via ORAL
  Filled 2016-08-19 (×6): qty 2
  Filled 2016-08-19: qty 1
  Filled 2016-08-19 (×3): qty 2

## 2016-08-19 NOTE — Progress Notes (Signed)
CSW attempted to meet with MOB to complete assessment for hx of Anxiety and PPD, but she was working with lactation at this time.  CSW spoke with bedside RN who states MOB has had a very busy morning with baby's circumcision and hearing screen and that she is "sleepy."  CSW will attempt again at a later time to complete assessment if possible.   CSW notes that MOB was seen by a CSW for the same consult reasons one year ago and offered resources at that time.

## 2016-08-19 NOTE — Anesthesia Postprocedure Evaluation (Addendum)
Anesthesia Post Note  Patient: Molly FantasiaBelinda Rojas  Procedure(s) Performed: Procedure(s) (LRB): CESAREAN SECTION (N/A)  Patient location during evaluation: Mother Baby Anesthesia Type: Spinal Level of consciousness: awake Pain management: pain level controlled Vital Signs Assessment: post-procedure vital signs reviewed and stable Respiratory status: spontaneous breathing Cardiovascular status: stable Postop Assessment: no headache, no backache, spinal receding and patient able to bend at knees Anesthetic complications: no        Last Vitals:  Vitals:   08/19/16 0315 08/19/16 0627  BP: (!) 97/50   Pulse: (!) 57   Resp: 18   Temp: 36.6 C 36.9 C    Last Pain:  Vitals:   08/19/16 0630  TempSrc:   PainSc: 3    Pain Goal: Patients Stated Pain Goal: 3 (08/18/16 0830)               Edison PaceWILKERSON,VALERIE

## 2016-08-19 NOTE — Lactation Note (Signed)
This note was copied from a baby's chart. Lactation Consultation Note  Patient Name: Molly Tylene FantasiaBelinda Germond ZHYQM'VToday's Date: 08/19/2016 Reason for consult: Initial assessment;Late preterm infant;Infant < 6lbs   LPI, 17 hours old. Mom states that she nursed her first 2 children 4 and 7 months respectively. Mom reports that she overproduced breast milk and had to pump some. Mom reports that baby circumcised this morning and is now sleepy. Discussed infant behavior. Assisted mom with latching baby, but baby sleepy at breast. Mom given LPI guidelines with review. Enc mom to put baby to breast with cues and at least every 3 hours. Enc mom to supplement with EBM after each feeding according to guidelines--which were reviewed. Enc mom to post pump followed by hand expression. Reviewed EBM storage guidelines and how to supplement with spoon or curve-tipped syringe. Mom knows to call her nurse for assistance with supplementation as needed. Mom has DEBP at bedside and use and cleaning reviewed.   Mom aware of OP/BFSG and LC phone line assistance after D/C. Mom given paperwork for 2-week DEBP rental, and she is aware of cost. Discussed assessment and interventions with patient's bedside nurse, Raymon MuttonPierina, Charity fundraiserN.  Maternal Data    Feeding Feeding Type: Breast Fed Length of feed: 0 min  LATCH Score/Interventions Latch: Too sleepy or reluctant, no latch achieved, no sucking elicited. Intervention(s): Waking techniques Intervention(s): Adjust position;Assist with latch  Audible Swallowing: None Intervention(s): Skin to skin;Hand expression  Type of Nipple: Everted at rest and after stimulation  Comfort (Breast/Nipple): Soft / non-tender     Hold (Positioning): Assistance needed to correctly position infant at breast and maintain latch. Intervention(s): Breastfeeding basics reviewed;Support Pillows;Position options;Skin to skin  LATCH Score: 5  Lactation Tools Discussed/Used Pump Review: Setup, frequency, and  cleaning;Milk Storage Initiated by:: JW Date initiated:: 08/19/16   Consult Status Consult Status: Follow-up Date: 08/20/16 Follow-up type: In-patient    Sherlyn HayJennifer D Christan Ciccarelli 08/19/2016, 12:28 PM

## 2016-08-19 NOTE — Progress Notes (Signed)
Subjective: Postpartum Day 1: Cesarean Delivery Patient reports tolerating PO.    Objective: Vital signs in last 24 hours: Temp:  [97.5 F (36.4 C)-98.4 F (36.9 C)] 98.4 F (36.9 C) (03/16 0627) Pulse Rate:  [57-108] 57 (03/16 0315) Resp:  [11-24] 18 (03/16 0315) BP: (89-115)/(49-71) 97/50 (03/16 0315) SpO2:  [93 %-100 %] 95 % (03/16 [id number])  Physical Exam:  General: alert and cooperative Lochia: appropriate Uterine Fundus: firm Incision: abd pressure dressing intact DVT Evaluation: No evidence of DVT seen on physical exam. Negative Homan's sign. No cords or calf tenderness. No significant calf/ankle edema.   Recent Labs  08/18/16 1818 08/19/16 0525  HGB 10.1* 8.5*  HCT 30.4* 25.4*    Assessment/Plan: Status post Cesarean section. Doing well postoperatively.  Continue current care.  CURTIS,CAROL G 08/19/2016, 8:19 AM

## 2016-08-20 MED ORDER — IBUPROFEN 600 MG PO TABS: 600.0000 mg | ORAL_TABLET | Freq: Four times a day (QID) | ORAL | 0 refills | Status: DC | PRN

## 2016-08-20 MED ORDER — DOCUSATE SODIUM 100 MG PO CAPS: 100.0000 mg | ORAL_CAPSULE | Freq: Two times a day (BID) | ORAL | 2 refills | Status: DC

## 2016-08-20 MED ORDER — HYDROCODONE-ACETAMINOPHEN 5-325 MG PO TABS: 1.0000 | ORAL_TABLET | Freq: Four times a day (QID) | ORAL | 0 refills | Status: DC | PRN

## 2016-08-20 MED ORDER — FERROUS SULFATE 325 (65 FE) MG PO TBEC: 325.0000 mg | DELAYED_RELEASE_TABLET | Freq: Two times a day (BID) | ORAL | 2 refills | Status: DC

## 2016-08-20 NOTE — Progress Notes (Signed)
Spoke to Dr. Velvet BatheLedger and the MD will see the patient in the morning.  This has been communicated to the charge nurse.  The hard scripts are in the patient's cubby at the desk, in the usual spot.  The discharge orders will not be removed at this time.  No further.

## 2016-08-20 NOTE — Progress Notes (Signed)
Received phone call to have baby weighed, asked tech to retrieve scale, went to room awaiting tech and entered I's and O's on baby.  Mother was on the phone talking about feeding baby.  After Mother got off of phone call she became very emotional and snapped at nurse and tech.  This nurse is unaware of what upset her because she, as well as tech were asked to leave the room.  Prior to this call patient/staff relationship was great.

## 2016-08-20 NOTE — Lactation Note (Signed)
This note was copied from a baby's chart. Lactation Consultation Note: Experienced BF mom reports baby is latching well but feeding about every hour this morning,. Mom reports breasts are feeling fuller this morning. Baby waking, diaper changed Mom easily latched baby to breast. Several big swallows noted in first 5 min but then he gets sleepy and doesn't continue sucking. Encouraged mom to pump and supplement with EBM after every feeding. Has feeding guidelines. Mom going to bathroom then will pump and supplement. No questions at present. Reviewed this baby is different from others. To call for assist prn  Patient Name: Molly Rojas ZOXWR'UToday's Date: 08/20/2016 Reason for consult: Follow-up assessment;Infant < 6lbs;Late preterm infant   Maternal Data Formula Feeding for Exclusion: No Has patient been taught Hand Expression?: Yes Does the patient have breastfeeding experience prior to this delivery?: Yes  Feeding Feeding Type: Breast Fed Length of feed: 8 min  LATCH Score/Interventions Latch: Grasps breast easily, tongue down, lips flanged, rhythmical sucking.  Audible Swallowing: A few with stimulation  Type of Nipple: Everted at rest and after stimulation  Comfort (Breast/Nipple): Soft / non-tender     Hold (Positioning): Assistance needed to correctly position infant at breast and maintain latch. Intervention(s): Breastfeeding basics reviewed  LATCH Score: 8  Lactation Tools Discussed/Used     Consult Status Consult Status: Follow-up Date: 08/21/16 Follow-up type: In-patient    Molly Rojas, Molly Rojas 08/20/2016, 12:10 PM

## 2016-08-20 NOTE — Clinical Social Work Maternal (Signed)
CLINICAL SOCIAL WORK MATERNAL/CHILD NOTE  Patient Details  Name: Molly Rojas MRN: [id number] Date of Birth: 11/05/1985  Date:  08/20/2016  Clinical Social Worker Initiating Note:  Deric Bocock, MSW, LCSW-A   Date/ Time Initiated:  08/20/16/1258              Child's Name:  Ezoriah Bruington    Legal Guardian:  Other (Comment) (Not established by court system; MOB and FOB ( Mitchell Uresti) parent collectively in primary household composition )   Need for Interpreter:  None   Date of Referral:  08/18/16     Reason for Referral:  Other (Comment) (MOB hx of anxiety/depression )   Referral Source:  Central Nursery   Address:  [address] [place], 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 )   Cultural/Religious Considerations Which May Impact Care: Christian per face sheet   Strengths: Ability to meet basic needs , Pediatrician chosen , Home prepared for child , Compliance with medical plan    Risk Factors/Current Problems: Other (Comment) (Hx of PPD and Anxiety )   Cognitive State: Alert , Able to Concentrate , Goal Oriented , Insightful    Mood/Affect: Calm , Comfortable , Interested    CSW Assessment:CSW met with MOB at bedside to complete assessment for consult regarding hx of anxiety and PPD. Upon this writers arrival MOB was in bed holding baby bonding. This writer explained role and reasoning for visit. MOB was warm and welcoming of this writers arrival. MOB notes she does have a dx of anxiety and has dealt with it for several years. 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 MOB notes she still does deal with it from time to time currently; however, she has made some life style changes that has allowed her to manage a lot better. This writer praised MOB for her ability to self cope and use coping skills to manage. MOB was thankful. This writer inquired about hx of PPD. MOB notes she experienced it during her 1st pregnancy but not her 3rd. She further notes her experience with PPD was also around the time her husband was deployed and she believes that has a lot to do with whys he experienced it. This writer reviewed PPD and safe sleeping/SIDS. MOB verbalized understanding. This writer educated MOB on preventative measures she can practice to prevent onset. MOB was thankful of this. MOB notes she is not on any meds currently and does not feel she needs them at this time. MOB notes she has no other needs at this time. CSW has no further barriers to d/c thus case closed to this CSW.   CSW Plan/Description: Patient/Family Education , No Further Intervention Required/No Barriers to Discharge   Azilee Pirro, MSW, LCSW-A Clinical Social Worker  [facility]  Office: [phone]    CLINICAL SOCIAL WORK MATERNAL/CHILD NOTE  Patient Details  Name: Molly Rojas MRN: [id number] Date of Birth: 11/27/1985  Date:  08/20/2016  Clinical Social Worker Initiating Note:  Ferdinand Lango Leisha Trinkle, MSW, LCSW-A  Date/ Time Initiated:  08/20/16/1258     Child's Name:  Jory Sims    Legal Guardian:  Other (Comment) (Not established by court system; MOB and FOB Gladstone Pih) parent collectively in primary household composition )   Need for Interpreter:  None   Date of Referral:  08/18/16     Reason for Referral:  Other (Comment) (MOB hx of anxiety/depression )   Referral Source:  Central Nursery   Address:  [address] 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 )   Cultural/Religious Considerations Which May Impact Care: Christian per face sheet   Strengths:  Ability to meet basic needs , Pediatrician chosen , Home prepared for child , Compliance with medical plan    Risk Factors/Current Problems:  Other (Comment) (Hx of PPD and Anxiety )   Cognitive State:  Alert , Able to Concentrate , Goal Oriented , Insightful    Mood/Affect:  Calm , Comfortable , Interested    CSW Assessment: CSW met with MOB at bedside to complete assessment for consult regarding hx of anxiety and PPD. Upon this writers arrival MOB was in bed holding baby bonding. This Probation officer explained role and reasoning for visit. MOB was warm and welcoming of this writers arrival. MOB notes she does have a dx of anxiety and has dealt with it for several years. She attributes it mainly to her husband and her father  being deployed in the [employer] when they were active duty. MOB notes she still does deal with it from time to time currently; however, she has made some life style changes that has allowed her to manage a lot better. This Probation officer praised MOB for her ability to self cope and use coping skills to manage. MOB was thankful. This Probation officer inquired about hx of PPD. MOB notes she experienced it during her 1st pregnancy but not her 3rd. She further notes her experience with PPD was also around the time her husband was deployed and she believes that has a lot to do with whys he experienced it. This Probation officer reviewed PPD and safe sleeping/SIDS. MOB verbalized understanding. This Probation officer educated MOB on preventative measures she can practice to prevent onset. MOB was thankful of this. MOB notes she is not on any meds currently and does not feel she needs them at this time. MOB notes she has no other needs at this time. CSW has no further barriers to d/c thus case closed to this CSW.   CSW Plan/Description:  Engineer, mining , No Further Intervention Required/No Barriers to Discharge   Oda Cogan, MSW, [facility]  Office: [phone]

## 2016-08-20 NOTE — Progress Notes (Signed)
Patient is uncomfortable with discharging today.

## 2016-08-20 NOTE — Discharge Summary (Signed)
Obstetric Discharge Summary Reason for Admission: cesarean section s/s PTL and placental abruption Prenatal Procedures: none Intrapartum Procedures: cesarean: low cervical, transverse Postpartum Procedures: none Complications-Operative and Postpartum: none Hemoglobin  Date Value Ref Range Status  08/19/2016 8.5 (L) 12.0 - 15.0 g/dL Final   HCT  Date Value Ref Range Status  08/19/2016 25.4 (L) 36.0 - 46.0 % Final    Physical Exam:  General: alert, cooperative and appears stated age 89Lochia: appropriate Uterine Fundus: firm Incision: healing well, no significant drainage, no dehiscence, no significant erythema DVT Evaluation: No evidence of DVT seen on physical exam. Negative Homan's sign. No cords or calf tenderness. No significant calf/ankle edema.  Discharge Diagnoses: Premature labor and placental abruption, CS (delivered)  Discharge Information: Date: 08/20/2016 Activity: pelvic rest Diet: routine Medications: Ibuprofen, Colace, Iron and Vicodin Condition: stable Instructions: refer to practice specific booklet Discharge to: home   Newborn Data: Live born female  Birth Weight: 6 lb 9.1 oz (2980 g) APGAR: 8, 8  Home with mother.  Molly Rojas 08/20/2016, 8:14 AM

## 2016-08-21 NOTE — Lactation Note (Signed)
This note was copied from a baby's chart. Lactation Consultation Note  Patient Name: Molly Tylene FantasiaBelinda Junker WUJWJ'XToday's Date: 08/21/2016 Reason for consult: Follow-up assessment   With this mom and LPI, now 7466 hours old, and 35 4/7 weeks CGA.The baby is now at 12 % weight loss, . Mom  has been breast feeding her baby with cues, every 1.5 - 3 hours, and supplementing with syringe.He had been getting too sleepy to take a good amount of supplement, so I suggested mom use a bottle, which I feel is easier for a LPI.  She had sent her EBM home, because she was not aware her milk could be refrigerated, and had to discard a large amount of milk, that had been in room temperature for over 6 hours.  Mom has a very good milk supply, and the milk she sent home was yellow/colostrum. I asked mom to call dad, which she did, to bring back her milk. I also told her to limit baby's time at the breast to 15 minutes,( he feeds about 5- 15 minutes anyway) and then feed, by bottle, EBM prior to formula. The baby was able to take 20 ml's of EBM by bottle. I advised mom to offer him more than 20 ml's, as he begins to ask for more.  I did put a call into [facility], to have the MD on call call me, to update her on my consult.    Maternal Data    Feeding Feeding Type: Bottle Fed - Breast Milk Nipple Type: Slow - flow Length of feed: 5 min  LATCH Score/Interventions Latch: Grasps breast easily, tongue down, lips flanged, rhythmical sucking.  Audible Swallowing: A few with stimulation (baby alsee pa t breast after 5 minutes) Intervention(s): Hand expression  Type of Nipple: Everted at rest and after stimulation  Comfort (Breast/Nipple): Soft / non-tender     Hold (Positioning): No assistance needed to correctly position infant at breast. Intervention(s): Breastfeeding basics reviewed;Support Pillows;Position options  LATCH Score: 9  Lactation Tools Discussed/Used     Consult Status Consult Status:  Follow-up Date: 08/22/16 Follow-up type: In-patient    Alfred LevinsLee, Meredyth Hornung Anne 08/21/2016, 1:58 PM

## 2016-08-21 NOTE — Lactation Note (Signed)
This note was copied from a baby's chart. Lactation Consultation Note Dr. Vonna KotykeClaire called to clarify that mom can continue to breast feed for about 15 minutes and then supplement with her EBM and then formula as needed.  Dr. Eather ColasAware mom is now pumping large volume and agrees if mom has enough to give EBM first and as formula as needed.   LC reported to nursery RN, Alvino Chapelllen and bedside [employer] Brooke.       Patient Name: Molly Tylene FantasiaBelinda Friese ZOXWR'UToday's Rojas: 08/21/2016 Reason for consult: Follow-up assessment   Maternal Data    Feeding Feeding Type: Bottle Fed - Breast Milk Nipple Type: Slow - flow Length of feed: 10 min  LATCH Score/Interventions Latch: Grasps breast easily, tongue down, lips flanged, rhythmical sucking.  Audible Swallowing: A few with stimulation (baby alsee pa t breast after 5 minutes) Intervention(s): Hand expression  Type of Nipple: Everted at rest and after stimulation  Comfort (Breast/Nipple): Soft / non-tender     Hold (Positioning): No assistance needed to correctly position infant at breast. Intervention(s): Breastfeeding basics reviewed;Support Pillows;Position options  LATCH Score: 9  Lactation Tools Discussed/Used     Consult Status Consult Status: Follow-up Rojas: 08/22/16 Follow-up type: In-patient    Beverely RisenShoptaw, Arvella MerlesJana Lynn 08/21/2016, 3:53 PM

## 2016-08-21 NOTE — Progress Notes (Signed)
Subjective: Postpartum Day 3: Cesarean Delivery Patient reports tolerating PO.  Meeting all goals including VF, ambulating w/out issues, passing flatus, and having appropriate lochia.   Objective: Vital signs in last 24 hours:  Vitals:   08/20/16 1809 08/21/16 0500  BP: 118/65 107/62  Pulse: 85 73  Resp: 16 18  Temp: 99 F (37.2 C) 98.2 F (36.8 C)    Physical Exam:  General: alert and cooperative Lochia: appropriate Uterine Fundus: firm Incision: abd pressure dressing intact DVT Evaluation: No evidence of DVT seen on physical exam. Negative Homan's sign. No cords or calf tenderness. No significant calf/ankle edema.   Recent Labs (last 2 labs)    Recent Labs  08/18/16 1818 08/19/16 0525  HGB 10.1* 8.5*  HCT 30.4* 25.4*      Assessment/Plan: Status post Cesarean section. Doing well postoperatively.  D/c today. No changes from DCS except D/C was today.  Rosie FateE Alaria Oconnor MD

## 2016-08-22 ENCOUNTER — Ambulatory Visit: Payer: Self-pay

## 2016-08-22 NOTE — Lactation Note (Addendum)
This note was copied from a baby's chart. Lactation Consultation Note  Patient Name: Molly Tylene FantasiaBelinda Andujo JWJXB'JToday's Date: 08/22/2016 Reason for consult: Follow-up assessment;Late preterm infant;Infant < 6lbs  LPI 90 hours old. Mom reports that baby is being discharged, will see pediatrician in the morning (08/23/16) and will see a lactation consultant at the pediatrician's office on Thursday (08/25/16). Mom states that baby is not taking the bottle very well, and is tired and sleepy at breast. Mom nursing baby, so enc mom to nurse for about 10 minutes. Demonstrated to parents how to feed baby in side-lying position using a slow-flow nipple, and attempted a Dr. Theora GianottiBrown's nipple and bottle--for use as needed. Parents aware of where to purchase additional nipples as needed. Baby tolerated slow flow nipple of regular bottle well and took 19 ml of EBM in 20 minutes. However, baby sleepy and not easily stimulated to suckle.  Plan is for parents to feed baby with cues and at least every 3 hours. Enc mom to pump first--storing the foremilk, and giving baby the hindmilk by bottle in side-lying position. Enc mom to hold baby STS after feeding and let baby breastfeed as willing. Enc keeping the total feeding time to 30 minutes. Discussed with parents how to know when to start putting baby to breast first, gradually--when baby nursing and gaining weight well. Enc mom to "wear" baby STS as often as possible, and reviewed LPI guidelines about avoiding overstimulation of baby.   Parents given a 2-week, DEBP rental. Mom aware of OP/BFSG and LC phone line assistance after D/C. Discussed assessment and interventions with patient's bedside nurse, Judeth CornfieldStephanie, RN.   Maternal Data    Feeding Feeding Type: Breast Milk Length of feed: 20 min  LATCH Score/Interventions Latch: Grasps breast easily, tongue down, lips flanged, rhythmical sucking. Intervention(s): Skin to skin  Audible Swallowing: A few with  stimulation Intervention(s): Skin to skin  Type of Nipple: Everted at rest and after stimulation  Comfort (Breast/Nipple): Soft / non-tender     Hold (Positioning): No assistance needed to correctly position infant at breast.  LATCH Score: 9  Lactation Tools Discussed/Used     Consult Status      Sherlyn HayJennifer D Alexandr Yaworski 08/22/2016, 1:28 PM

## 2016-08-25 ENCOUNTER — Ambulatory Visit: Payer: Self-pay

## 2016-08-25 NOTE — Lactation Note (Addendum)
This note was copied from a baby's chart. Lactation Consult; weight today 2608 g  5 # 12.0 oz. Here today for follow up for LPTI born at 1835. 4 Molly Rojas. Slow to gain weight after 12 % weight loss Mom has been bottle feeding EBM Is offering breast after bottle feedings but she states he is full and too sleepy to latch most of the time, Molly Rojas latched well but gets sleepy after about 5 min. Some swallows noted at beginning of feeding. Mom easily able to express white milk. He nursed for about 10 min. Weight up 20 g. Mom offered bottle but he was very tired and did not feed well until about 15 min later, Dad fed 30 ml of EBM. Encouraged to continue same plan of bottle feeding hind milk-  He seems to tire easily at the breast. To continue offering breast after feedings to keep him familiar with breast. Continue pumping to keep supply up. Follow up appt here made for Tuesday 3/27 at 4 pm. Parents agreeable with plan. Encouragement given. No questions at present. To call prn  Mother's reason for visit:  Needs help with feedings Visit Type:  Feeding assessment Appointment Notes: 35 Molly Rojas very slow weight gain Consult:  Initial Lactation Consultant:  Molly Rojas, Molly Rojas  ________________________________________________________________________    ________________________________________________________________________  Mother's Name: Molly Rojas Type of delivery:  C-Section, Low Transverse Breastfeeding Experience:  P3 Experienced BF mom ________________________________________________________________________  Breastfeeding History (Post Discharge)  Frequency of breastfeeding:  Has been bottle feeding EBM then offering breast after feeding, she reports he is full and sleepy and does not latch very often   Supplementation  Formula:  Volume 0 ml  Breastmilk:  Volume 45 ml Frequency:  q 2  1/2 - 3 hours  Method:  Bottle,   Pumping  Type of pump:  Symphony Frequency:  q 3 hours Volume:  4-6  oz  Infant Intake and Output Assessment  Voids:  6+ in 24 hrs.  Color:  Clear yellow had void while here for appointment Stools:  6+ in 24 hrs.  Color:  Yellow  ________________________________________________________________________  Maternal Breast Assessment  Breast:  Filling Nipple:  Erect  _______________________________________________________________________ Feeding Assessment/Evaluation  Initial feeding assessment:  Positioning:  Cradle Left breast    Pre-feed weight:  2608 g  5 # 12.0 oz Post-feed weight:  2628 g 5 # 12.7 oz Amount transferred:  20 ml Amount supplemented:  30 ml    Total amount pumped post feed:  15 ml total- had pumped shortly before coming to appointment  #30 flanges given to mom- she reports that feels better.   Total amount transferred:  20 ml Total supplement given:  30 ml

## 2016-08-30 ENCOUNTER — Ambulatory Visit: Payer: Self-pay

## 2016-08-30 NOTE — Lactation Note (Signed)
This note was copied from a baby's chart. Lactation Consult  Mother's reason for visit:  Breast feeding assistance preemie Visit Type: OP Appointment Notes:  Mom is looking for assistance BF her son who was born at 5035 weeks. He is 37 weeks today. He attached to the right breast and with adjustments his latch became deeper. He needed stimulation to continue engaging.  He transferred 12 ml in 15 minutes on the right breast. He was then placed on the left breast and transferred an additional 20 ml. For a total of 32. The feeding took 25 minutes. He would not take any additional BM.  Mom reports that her BF and took 47 ml of bottled breastmilk about 2.5 hours prior to this. He has started gain so no concern at this point. Special needs feeder initiated related to baby leaking BM from his mouth when using other nipples. He does not bottle feed well at this point. Showed mom how to use cheek squeezes to help increase intraoral vacuum.  Encouraged mother to increase BF to 4 times in 24 hours and follow with an ounce of EBM. Advised to increase other feeding to 2-3 oz. Follow-up with lactation on 09/02/2016.  Consult:  Follow-Up Lactation Consultant:  Molly Rojas, Molly Rojas  ________________________________________________________________________  6#9 oz BW 5+15.5 yesterday Tourist information centre manager([employer]) 6+0.3 today Ped: Molly Rojas   ________________________________________________________________________  Mother's Name: Molly MelterEzariah Ezekiel Rojas Type of delivery:  C-Section, Low Transverse Breastfeeding Experience:  2nd BF experience Maternal Medical Conditions:  denies Maternal Medications:  PNV, Iron, Colace  ________________________________________________________________________  Breastfeeding History (Post Discharge)  Frequency of breastfeeding:  1-2 times in 24 Duration of feeding:  5-10 min  Output Assessment  Voids:  6 in 24 hrs.  Color:  Clear yellow Stools:  6 in 24 hrs.  Color:  Yellow

## 2016-09-15 ENCOUNTER — Inpatient Hospital Stay (HOSPITAL_COMMUNITY): Admission: RE | Admit: 2016-09-15 | Source: Ambulatory Visit | Admitting: Obstetrics and Gynecology

## 2016-09-15 ENCOUNTER — Encounter (HOSPITAL_COMMUNITY): Admission: RE | Payer: Self-pay | Source: Ambulatory Visit

## 2016-09-15 SURGERY — Surgical Case
Anesthesia: Regional

## 2016-11-11 NOTE — Addendum Note (Signed)
Addendum  created 11/11/16 1058 by Lavonte Palos, MD   Sign clinical note    

## 2016-11-15 ENCOUNTER — Other Ambulatory Visit: Payer: Self-pay

## 2016-11-15 DIAGNOSIS — I8392 Asymptomatic varicose veins of left lower extremity: Secondary | ICD-10-CM

## 2016-12-26 ENCOUNTER — Encounter: Payer: Self-pay | Admitting: Vascular Surgery

## 2016-12-27 ENCOUNTER — Ambulatory Visit (INDEPENDENT_AMBULATORY_CARE_PROVIDER_SITE_OTHER): Admitting: Vascular Surgery

## 2016-12-27 ENCOUNTER — Encounter: Payer: Self-pay | Admitting: Vascular Surgery

## 2016-12-27 VITALS — BP 106/64 | HR 66 | Temp 98.0°F | Resp 18 | Ht 60.5 in | Wt 137.7 lb

## 2016-12-27 DIAGNOSIS — I83891 Varicose veins of right lower extremities with other complications: Secondary | ICD-10-CM | POA: Diagnosis not present

## 2016-12-27 NOTE — Progress Notes (Signed)
Subjective:     Patient ID: Molly Rojas, female   DOB: 1985/06/12, 31 y.o.   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 Patient has had varicose veins since her first pregnancy 5 years ago and these have worsened in the interim. She develops swelling of the day progresses. She does not roll elastic compression stockings. She has not history of DVT thrombophlebitis stasis ulcers or bleeding. The varicosities which are primarily in the calf area have gotten progressively larger with time. This is affecting her daily living and ability to work standing as she does daily. She has no symptoms and contralateral left leg.  Past Medical History:  Diagnosis Date  . Abnormal Pap smear   . Anxiety   . GERD (gastroesophageal reflux disease)    with pregnancy   . Headache(784.0)   . HPV (human papilloma virus) infection   . No pertinent past medical history   . Scoliosis   . Scoliosis   . Umbilical hernia     Social History  Substance Use Topics  . Smoking status: Never Smoker  . Smokeless tobacco: Never Used  . Alcohol use 0.0 oz/week     Comment: occasional     Family History  Problem Relation Age of Onset  . Diabetes Mother   . Thyroid disease Mother   . Hyperlipidemia Father   . Hypertension Father   . Diabetes Maternal Aunt   . Diabetes Maternal Uncle   . Diabetes Maternal Grandfather   . Hyperlipidemia Paternal Grandmother   . Heart disease Paternal Grandfather   . Anesthesia problems Neg Hx   . Hypotension Neg Hx   . Malignant hyperthermia Neg Hx   . Pseudochol deficiency Neg Hx     Allergies  Allergen Reactions  . Percocet [Oxycodone-Acetaminophen] Other (See Comments)    hallucinations     Current Outpatient Prescriptions:  .  BIOTIN MAXIMUM PO, Take by mouth daily., Disp: , Rfl:  .  Multiple Vitamin (MULTIVITAMIN) tablet, Take 1 tablet by mouth daily., Disp: , Rfl:  .  polyethylene glycol  (MIRALAX / GLYCOLAX) packet, Take 17 g by mouth daily as needed., Disp: , Rfl:  .  Probiotic Product (PROBIOTIC-10 PO), Take by mouth daily., Disp: , Rfl:  .  docusate sodium (COLACE) 100 MG capsule, Take 1 capsule (100 mg total) by mouth 2 (two) times daily. (Patient not taking: Reported on 12/27/2016), Disp: 30 capsule, Rfl: 2 .  ferrous sulfate 325 (65 FE) MG EC tablet, Take 1 tablet (325 mg total) by mouth 2 (two) times daily. (Patient not taking: Reported on 12/27/2016), Disp: 60 tablet, Rfl: 2 .  HYDROcodone-acetaminophen (NORCO/VICODIN) 5-325 MG tablet, Take 1 tablet by mouth every 6 (six) hours as needed for moderate pain. (Patient not taking: Reported on 12/27/2016), Disp: 30 tablet, Rfl: 0 .  ibuprofen (ADVIL,MOTRIN) 600 MG tablet, Take 1 tablet (600 mg total) by mouth every 6 (six) hours as needed. (Patient not taking: Reported on 12/27/2016), Disp: 30 tablet, Rfl: 0  Vitals:   12/27/16 1433  BP: 106/64  Pulse: 66  Resp: 18  Temp: 98 F (36.7 C)  TempSrc: Oral  SpO2: 99%  Weight: 137 lb 11.2 oz (62.5 kg)  Height: 5' 0.5" (1.537 m)    Body mass index is 26.45 kg/m.         Review of Systems Denies chest pain, dyspnea on exertion, PND, orthopnea, hemoptysis, claudication    Objective:  Physical Exam BP 106/64 (BP Location: Left Arm, Patient Position: Sitting, Cuff Size: Normal)   Pulse 66   Temp 98 F (36.7 C) (Oral)   Resp 18   Ht 5' 0.5" (1.537 m)   Wt 137 lb 11.2 oz (62.5 kg)   SpO2 99%   BMI 26.45 kg/m     Gen.-alert and oriented x3 in no apparent distress HEENT normal for age Lungs no rhonchi or wheezing Cardiovascular regular rhythm no murmurs carotid pulses 3+ palpable no bruits audible Abdomen soft nontender no palpable masses Musculoskeletal free of  major deformities Skin clear -no rashes Neurologic normal Lower extremities 3+ femoral and dorsalis pedis pulses palpable bilaterally with no edemaOn the left 1+ edema on the right Bulging  varicosities over the great saphenous vein beginning at the knee level extending distally toward the medial malleolus and anteriorly toward the pretibial region. No hyperpigmentation or ulceration is noted.  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

## 2017-01-31 ENCOUNTER — Encounter: Payer: Self-pay | Admitting: Vascular Surgery

## 2017-01-31 ENCOUNTER — Encounter (HOSPITAL_COMMUNITY): Payer: Self-pay

## 2017-03-06 DIAGNOSIS — I739 Peripheral vascular disease, unspecified: Secondary | ICD-10-CM

## 2017-03-06 HISTORY — DX: Peripheral vascular disease, unspecified: I73.9

## 2017-03-28 ENCOUNTER — Encounter: Payer: Self-pay | Admitting: Vascular Surgery

## 2017-03-28 ENCOUNTER — Ambulatory Visit (INDEPENDENT_AMBULATORY_CARE_PROVIDER_SITE_OTHER): Admitting: Vascular Surgery

## 2017-03-28 ENCOUNTER — Ambulatory Visit (HOSPITAL_COMMUNITY)
Admission: RE | Admit: 2017-03-28 | Discharge: 2017-03-28 | Disposition: A | Discharge status: Home / Self Care | Source: Ambulatory Visit | Attending: Vascular Surgery | Admitting: Vascular Surgery

## 2017-03-28 VITALS — BP 103/63 | HR 68 | Temp 98.0°F | Resp 16 | Ht 60.0 in | Wt 140.0 lb

## 2017-03-28 DIAGNOSIS — I83891 Varicose veins of right lower extremities with other complications: Secondary | ICD-10-CM

## 2017-03-28 DIAGNOSIS — R609 Edema, unspecified: Secondary | ICD-10-CM | POA: Insufficient documentation

## 2017-03-28 DIAGNOSIS — I8392 Asymptomatic varicose veins of left lower extremity: Secondary | ICD-10-CM

## 2017-03-28 DIAGNOSIS — I8393 Asymptomatic varicose veins of bilateral lower extremities: Secondary | ICD-10-CM | POA: Diagnosis not present

## 2017-03-28 NOTE — Progress Notes (Signed)
Subjective:     Patient ID: Molly Rojas, female   DOB: 03-31-1986, 31 y.o.   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 She has tried long leg elastic compression stockings 20-30 millimeter gradient as well as elevation and ibuprofen but continues to have aching throbbing and burning discomfort in the right leg is a day progresses. Her right ankle consistently is more swollen than the left side. She has no history of DVT.  Past Medical History:  Diagnosis Date  . Abnormal Pap smear   . Anxiety   . GERD (gastroesophageal reflux disease)    with pregnancy   . Headache(784.0)   . HPV (human papilloma virus) infection   . No pertinent past medical history   . Scoliosis   . Scoliosis   . Umbilical hernia     Social History  Substance Use Topics  . Smoking status: Never Smoker  . Smokeless tobacco: Never Used  . Alcohol use 0.0 oz/week     Comment: occasional     Family History  Problem Relation Age of Onset  . Diabetes Mother   . Thyroid disease Mother   . Hyperlipidemia Father   . Hypertension Father   . Diabetes Maternal Aunt   . Diabetes Maternal Uncle   . Diabetes Maternal Grandfather   . Hyperlipidemia Paternal Grandmother   . Heart disease Paternal Grandfather   . Anesthesia problems Neg Hx   . Hypotension Neg Hx   . Malignant hyperthermia Neg Hx   . Pseudochol deficiency Neg Hx     Allergies  Allergen Reactions  . Percocet [Oxycodone-Acetaminophen] Other (See Comments)    hallucinations     Current Outpatient Prescriptions:  .  docusate sodium (COLACE) 100 MG capsule, Take 1 capsule (100 mg total) by mouth 2 (two) times daily., Disp: 30 capsule, Rfl: 2 .  ibuprofen (ADVIL,MOTRIN) 600 MG tablet, Take 1 tablet (600 mg total) by mouth every 6 (six) hours as needed., Disp: 30 tablet, Rfl: 0 .  Multiple Vitamin (MULTIVITAMIN) tablet, Take 1 tablet by mouth daily., Disp: ,  Rfl:  .  polyethylene glycol (MIRALAX / GLYCOLAX) packet, Take 17 g by mouth daily as needed., Disp: , Rfl:  .  Probiotic Product (PROBIOTIC-10 PO), Take by mouth daily., Disp: , Rfl:  .  BIOTIN MAXIMUM PO, Take by mouth daily., Disp: , Rfl:  .  ferrous sulfate 325 (65 FE) MG EC tablet, Take 1 tablet (325 mg total) by mouth 2 (two) times daily. (Patient not taking: Reported on 12/27/2016), Disp: 60 tablet, Rfl: 2 .  HYDROcodone-acetaminophen (NORCO/VICODIN) 5-325 MG tablet, Take 1 tablet by mouth every 6 (six) hours as needed for moderate pain. (Patient not taking: Reported on 12/27/2016), Disp: 30 tablet, Rfl: 0  Vitals:   03/28/17 1400  BP: 103/63  Pulse: 68  Resp: 16  Temp: 98 F (36.7 C)  TempSrc: Oral  SpO2: 100%  Weight: 140 lb (63.5 kg)  Height: 5' (1.524 m)    Body mass index is 27.34 kg/m.         Review of Systems Denies chest pain, dyspnea on exertion, PND, orthopnea, hemoptysis, claudication    Objective:   Physical Exam BP 103/63 (BP Location: Left Arm, Patient Position: Sitting, Cuff Size: Normal)   Pulse 68   Temp 98 F (36.7 C) (Oral)   Resp 16   Ht 5' (1.524 m)   Wt 140 lb (63.5 kg)  SpO2 100%   BMI 27.34 kg/m   Gen. well-developed well-nourished female no apparent distress alert and oriented 3 Right leg with bulging varicosities beginning in the distal thigh extending into the medial calf over the great saphenous system. 1+ edema below the knee. No hyperpigmentation or ulceration is noted. 3+ dorsalis pedis pulse palpable.  Today I ordered a venous duplex exam the right leg which I reviewed and interpreted. There is no DVT. There is gross reflux throughout large right great saphenous vein down to the knee level and this supplies these painful varicosities     Assessment:     Painful varicosities right leg due to gross reflux and large right great saphenous vein causing symptoms which are affecting patient's daily living and are resistant to  conservative measures including long-leg elastic compression stockings 20-30 millimeter gradient, elevation, and ibuprofen    Plan:     Patient needs laser ablation right great saphenous vein +10-20 stab phlebectomy of painful varicosities to be performed as a single procedure We will proceed with precertification to perform this in the near future and relieve her symptoms

## 2017-04-19 ENCOUNTER — Other Ambulatory Visit: Payer: Self-pay | Admitting: *Deleted

## 2017-04-19 DIAGNOSIS — I83891 Varicose veins of right lower extremities with other complications: Secondary | ICD-10-CM

## 2017-06-05 ENCOUNTER — Encounter: Payer: Self-pay | Admitting: Vascular Surgery

## 2017-06-05 ENCOUNTER — Ambulatory Visit (INDEPENDENT_AMBULATORY_CARE_PROVIDER_SITE_OTHER): Admitting: Vascular Surgery

## 2017-06-05 VITALS — BP 99/66 | HR 82 | Temp 98.6°F | Resp 16 | Ht 60.0 in | Wt 140.0 lb

## 2017-06-05 DIAGNOSIS — I83892 Varicose veins of left lower extremities with other complications: Secondary | ICD-10-CM | POA: Diagnosis not present

## 2017-06-05 HISTORY — PX: ENDOVENOUS ABLATION SAPHENOUS VEIN W/ LASER: SUR449

## 2017-06-05 NOTE — Progress Notes (Signed)
Laser Ablation Procedure    Date: 06/05/2017   Molly FantasiaBelinda Rojas DOB:1986/01/19  Consent signed: Yes    Surgeon:  Dr. Quita SkyeJames D. Hart [name]  Procedure: Laser Ablation: right Greater Saphenous Vein  BP 99/66 (BP Location: Left Arm, Patient Position: Sitting, Cuff Size: Large)   Pulse 82   Temp 98.6 F (37 C) (Oral)   Resp 16   Ht 5' (1.524 m)   Wt 140 lb (63.5 kg)   SpO2 99%   BMI 27.34 kg/m   Tumescent Anesthesia: 450 cc 0.9% NaCl with 50 cc Lidocaine HCL 1% and 15 cc 8.4% NaHCO3  Local Anesthesia: 5.5 cc Lidocaine HCL and NaHCO3 (ratio 2:1)  Pulsed Mode: 15 watts, 500ms delay, 1.0 duration  Total Energy:  1197  Joules          Total Pulses: 80               Total Time: 1:20    Stab Phlebectomy: 10-20 Sites: Calf and Ankle  Patient tolerated procedure well    Description of Procedure:  After marking the course of the secondary varicosities, the patient was placed on the operating table in the supine position, and the right leg was prepped and draped in sterile fashion.   Local anesthetic was administered and under ultrasound guidance the saphenous vein was accessed with a micro needle and guide wire; then the mirco puncture sheath was placed.  A guide wire was inserted saphenofemoral junction , followed by a 5 french sheath.  The position of the sheath and then the laser fiber below the junction was confirmed using the ultrasound.  Tumescent anesthesia was administered along the course of the saphenous vein using ultrasound guidance. The patient was placed in Trendelenburg position and protective laser glasses were placed on patient and staff, and the laser was fired at 15 watts continuous mode advancing 1-822mm/second for a total of 1197 joules.   For stab phlebectomies, local anesthetic was administered at the previously marked varicosities, and tumescent anesthesia was administered around the vessels.  Ten to 20 stab wounds were made using the tip of an 11 blade. And using the vein hook,  the phlebectomies were performed using a hemostat to avulse the varicosities.  Adequate hemostasis was achieved.     Steri strips were applied to the stab wounds and ABD pads and thigh high compression stockings were applied.  Ace wrap bandages were applied over the phlebectomy sites and at the top of the saphenofemoral junction. Blood loss was less than 15 cc.  The patient ambulated out of the operating room having tolerated the procedure well.

## 2017-06-05 NOTE — Progress Notes (Signed)
Subjective:     Patient ID: Tylene FantasiaBelinda Carsten, female   DOB: 24-Jan-1986, 31 y.o.   MRN: [id number]  HPI This 31 year old female had laser ablation of the left great saphenous vein from the proximal calf to the proximal third of the thigh +10-20 stab phlebectomy of painful varicosities both performed under local tumescent anesthesia. Total of approximately 1100 J of energy was utilized. Guidewire would not traversed the proximal third because of chronic disease in the vein were 2 veins converged therefore the ablation was performed from the proximal third down to the proximal calf.  Review of Systems     Objective:   Physical Exam BP 99/66 (BP Location: Left Arm, Patient Position: Sitting, Cuff Size: Large)   Pulse 82   Temp 98.6 F (37 C) (Oral)   Resp 16   Ht 5' (1.524 m)   Wt 140 lb (63.5 kg)   SpO2 99%   BMI 27.34 kg/m        Assessment:     Laser ablation left great saphenous vein from proximal calf to the proximal third of the thigh +10-20 stab phlebectomy of painful varicosities both performed under local tumescent anesthesia    Plan:     Patient return in 1 week for venous duplex exam to confirm closure left great saphenous vein

## 2017-06-07 ENCOUNTER — Encounter: Payer: Self-pay | Admitting: Vascular Surgery

## 2017-06-12 ENCOUNTER — Encounter: Payer: Self-pay | Admitting: Vascular Surgery

## 2017-06-12 ENCOUNTER — Other Ambulatory Visit: Payer: Self-pay

## 2017-06-12 ENCOUNTER — Ambulatory Visit (INDEPENDENT_AMBULATORY_CARE_PROVIDER_SITE_OTHER): Payer: Self-pay | Admitting: Vascular Surgery

## 2017-06-12 ENCOUNTER — Ambulatory Visit (HOSPITAL_COMMUNITY)
Admission: RE | Admit: 2017-06-12 | Discharge: 2017-06-12 | Disposition: A | Discharge status: Home / Self Care | Source: Ambulatory Visit | Attending: Vascular Surgery | Admitting: Vascular Surgery

## 2017-06-12 VITALS — BP 98/67 | HR 69 | Temp 97.4°F | Resp 14 | Ht 60.0 in | Wt 140.0 lb

## 2017-06-12 DIAGNOSIS — I83891 Varicose veins of right lower extremities with other complications: Secondary | ICD-10-CM

## 2017-06-12 NOTE — Progress Notes (Signed)
Subjective:     Patient ID: Molly Rojas, female   DOB: 1986/05/23, 32 y.o.   MRN: [id number]  HPI This 32 year old female returns 1 week post-laser ablation right great saphenous vein with multiple stab phlebectomy of painful varicosities. She has taken her ibuprofen as instructed and worn her elastic compression stockings. She experienced some mild discomfort in the right medial thigh which is now pretty much resolved. She is not having any significant swelling in the right ankle at this time. She has no complaints.  Review of Systems     Objective:   Physical Exam BP 98/67 (BP Location: Left Arm, Patient Position: Sitting, Cuff Size: Normal)   Pulse 69   Temp (!) 97.4 F (36.3 C) (Oral)   Resp 14   Ht 5' (1.524 m)   Wt 140 lb (63.5 kg)   BMI 27.34 kg/m   Gen. well-developed well-nourished female no apparent distress alert and oriented 3 Lungs no rhonchi or wheezing Right leg with mild discomfort deep palpation over great saphenous vein. Stab phlebectomy sites and calf healing nicely with mild ecchymosis. No distal edema noted. 3+ dorsalis pedis pulse palpable.  Today I ordered a venous duplex exam the right leg which I reviewed and interpreted. There is no DVT. There is total closure of the right great saphenous vein up to near the saphenofemoral junction.     Assessment:     Successful laser ablation right great saphenous vein with multiple stab phlebectomy of painful varicosities performed for gross reflux with swelling and pain-excellent early results    Plan:     Patient will return to see me on a when necessary basis

## 2017-06-19 ENCOUNTER — Ambulatory Visit: Payer: Self-pay | Admitting: Family Medicine

## 2017-06-21 ENCOUNTER — Other Ambulatory Visit: Payer: Self-pay

## 2017-06-21 ENCOUNTER — Ambulatory Visit (INDEPENDENT_AMBULATORY_CARE_PROVIDER_SITE_OTHER): Admitting: Physician Assistant

## 2017-06-21 ENCOUNTER — Encounter: Payer: Self-pay | Admitting: Physician Assistant

## 2017-06-21 VITALS — BP 133/75 | HR 70 | Temp 98.3°F | Resp 18 | Ht 61.02 in | Wt 142.0 lb

## 2017-06-21 DIAGNOSIS — K429 Umbilical hernia without obstruction or gangrene: Secondary | ICD-10-CM | POA: Diagnosis not present

## 2017-06-21 DIAGNOSIS — F419 Anxiety disorder, unspecified: Secondary | ICD-10-CM | POA: Diagnosis not present

## 2017-06-21 DIAGNOSIS — B977 Papillomavirus as the cause of diseases classified elsewhere: Secondary | ICD-10-CM

## 2017-06-21 DIAGNOSIS — Z1389 Encounter for screening for other disorder: Secondary | ICD-10-CM

## 2017-06-21 DIAGNOSIS — Z7689 Persons encountering health services in other specified circumstances: Secondary | ICD-10-CM

## 2017-06-21 DIAGNOSIS — M419 Scoliosis, unspecified: Secondary | ICD-10-CM

## 2017-06-21 LAB — POCT URINALYSIS DIP (MANUAL ENTRY)
Bilirubin, UA: NEGATIVE
Blood, UA: NEGATIVE
GLUCOSE UA: NEGATIVE mg/dL
Ketones, POC UA: NEGATIVE mg/dL
LEUKOCYTES UA: NEGATIVE
NITRITE UA: NEGATIVE
Protein Ur, POC: NEGATIVE mg/dL
Spec Grav, UA: 1.025 (ref 1.010–1.025)
UROBILINOGEN UA: 0.2 U/dL
pH, UA: 5.5 (ref 5.0–8.0)

## 2017-06-21 NOTE — Progress Notes (Signed)
Molly Rojas  MRN: [id number] DOB: 02-12-1986  Subjective:  Pt is a 32 y.o. G3P3 female who presents to establish care. She is from [place], [place] but has been in [place] for 14 years. Lives at home with husband and 3 kids. Not fasting today.   No PCP. Sees gynecologist, Dr. Renaldo Fiddler, yearly.  Menstrual cycles: LMP 05/25/2017. Cycles are now regular, last 7 days.  Will still have menorrhagia occassionally. Denies dysmenorrhea. Had tubal ligation after last pregnancy.  Chronic medical conditions: 1) Anxiety: Has had it for a few years, was worse with pregnancy. Used to be on zoloft but does not feel as if she needs medication at this time because it is well controlled without any management. Has also tried therapy in the past.  2) Pregnancy related GERD: No longer an issue.  3) Umbilical hernia: Has had this for 3 years. WAs referred to a surgeon in [facility] but did not like them so she never went back. It is painful occasionally, esp if she has not had a BM that day. Denies redness, warmth, or strangulation of hernia.  4) Scoliosis: Followed by [facility] for this year. Taking magnesium 500mg  QD and vitamin D3 5,000mg  QD.  Vaccinations      Tetanus: 2017      Influenza: Not this year  Patient Active Problem List   Diagnosis Date Noted  . Umbilical hernia   . Scoliosis   . HPV (human papilloma virus) infection   . Anxiety   . 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  . Preterm contractions 08/17/2016  . S/P cesarean section 07/22/2015    Current Outpatient Medications on File Prior to Visit  Medication Sig Dispense Refill  . Cholecalciferol (VITAMIN D3 PO) Take 5,000 mg by mouth.    . magnesium gluconate (MAGONATE) 500 MG tablet Take 500 mg by mouth 2 (two) times daily.    . Probiotic Product (PROBIOTIC-10 PO) Take by mouth daily.    Marland Kitchen BIOTIN MAXIMUM PO Take by mouth daily.    Marland Kitchen docusate sodium (COLACE) 100 MG capsule Take 1  capsule (100 mg total) by mouth 2 (two) times daily. (Patient not taking: Reported on 06/12/2017) 30 capsule 2  . ferrous sulfate 325 (65 FE) MG EC tablet Take 1 tablet (325 mg total) by mouth 2 (two) times daily. (Patient not taking: Reported on 06/21/2017) 60 tablet 2  . HYDROcodone-acetaminophen (NORCO/VICODIN) 5-325 MG tablet Take 1 tablet by mouth every 6 (six) hours as needed for moderate pain. (Patient not taking: Reported on 06/12/2017) 30 tablet 0  . ibuprofen (ADVIL,MOTRIN) 600 MG tablet Take 1 tablet (600 mg total) by mouth every 6 (six) hours as needed. (Patient not taking: Reported on 06/12/2017) 30 tablet 0  . Multiple Vitamin (MULTIVITAMIN) tablet Take 1 tablet by mouth daily.    . polyethylene glycol (MIRALAX / GLYCOLAX) packet Take 17 g by mouth daily as needed.     No current facility-administered medications on file prior to visit.     Allergies  Allergen Reactions  . Percocet [Oxycodone-Acetaminophen] Other (See Comments)    hallucinations    Social History   Socioeconomic History  . Marital status: Married    Spouse name: Clovis Riley  . Number of children: 3  . Years of education: None  . Highest education level: None  Social Needs  . Financial resource strain: None  . Food insecurity - worry: None  . Food insecurity - inability: None  . Transportation needs - medical:  None  . Transportation needs - non-medical: None  Occupational History  . None  Tobacco Use  . Smoking status: Never Smoker  . Smokeless tobacco: Never Used  Substance and Sexual Activity  . Alcohol use: Yes    Alcohol/week: 1.8 oz    Types: 3 Glasses of wine per week    Comment: occasional   . Drug use: No  . Sexual activity: Yes    Comment: with monogamous husband  Other Topics Concern  . None  Social History Narrative   ** Merged History Encounter **        Past Surgical History:  Procedure Laterality Date  . BACK SURGERY    . CESAREAN SECTION    . CESAREAN SECTION  03/28/2011    Procedure: CESAREAN SECTION;  Surgeon: Juluis Mire;  Location: [facility];  Service: Gynecology;  Laterality: N/A;  . CESAREAN SECTION N/A 07/22/2015   Procedure: CESAREAN SECTION;  Surgeon: Zelphia Cairo, MD;  Location: [facility];  Service: Obstetrics;  Laterality: N/A;  Repeat edc 07/28/15 NKDA  . CESAREAN SECTION N/A 08/18/2016   Procedure: CESAREAN SECTION;  Surgeon: Richarda Overlie, MD;  Location: [facility];  Service: Obstetrics;  Laterality: N/A;  . DENTAL SURGERY    . ENDOVENOUS ABLATION SAPHENOUS VEIN W/ LASER Right 06/05/2017   endovenous laser ablation R GSV and stab phlebectomy 10-20 incisions R leg by Josephina Gip MD   . NO PAST SURGERIES    . SPINAL FUSION  placed rods from T-11-L4   for scoliosis, spinal fusion with screws and rods  . WISDOM TOOTH EXTRACTION      Family History  Problem Relation Age of Onset  . Diabetes Mother   . Thyroid disease Mother   . Hyperlipidemia Father   . Hypertension Father   . Diabetes Maternal Aunt   . Diabetes Maternal Uncle   . Diabetes Maternal Grandfather   . Hyperlipidemia Paternal Grandmother   . Heart disease Paternal Grandfather   . Hyperlipidemia Paternal Grandfather   . Hypertension Paternal Grandfather   . Mental illness Brother   . Anesthesia problems Neg Hx   . Hypotension Neg Hx   . Malignant hyperthermia Neg Hx   . Pseudochol deficiency Neg Hx     Review of Systems  Constitutional: Positive for fatigue and unexpected weight change (weight gain). Negative for chills, diaphoresis and fever.  Cardiovascular: Negative for chest pain, palpitations and leg swelling.  Gastrointestinal: Negative for nausea and vomiting.  Endocrine: Negative for cold intolerance, heat intolerance, polydipsia, polyphagia and polyuria.       Positive for hair loss   Genitourinary: Negative for dysuria, frequency, hematuria and vaginal discharge.  Skin: Negative for rash.  Neurological: Negative for dizziness, weakness, numbness and  headaches.    Objective:  BP 133/75 (BP Location: Left Arm, Patient Position: Sitting, Cuff Size: Normal)   Pulse 70   Temp 98.3 F (36.8 C)   Resp 18   Ht 5' 1.02" (1.55 m)   Wt 142 lb (64.4 kg)   LMP 05/25/2017 (Approximate)   SpO2 99%   Breastfeeding? No   BMI 26.81 kg/m   Physical Exam  Constitutional: She is oriented to person, place, and time and well-developed, well-nourished, and in no distress.  HENT:  Head: Normocephalic and atraumatic.  Eyes: Conjunctivae are normal.  Neck: Normal range of motion.  Pulmonary/Chest: Effort normal.  Abdominal: A hernia is present. Hernia confirmed positive in the umbilical area (Reducible, no overlying erythema or warmth. No tenderness with  palpation. ).  Neurological: She is alert and oriented to person, place, and time. Gait normal.  Skin: Skin is warm and dry.  Psychiatric: Affect normal.  Vitals reviewed.  Results for orders placed or performed in visit on 06/21/17 (from the past 24 hour(s))  POCT urinalysis dipstick     Status: None   Collection Time: 06/21/17 12:09 PM  Result Value Ref Range   Color, UA yellow yellow   Clarity, UA clear clear   Glucose, UA negative negative mg/dL   Bilirubin, UA negative negative   Ketones, POC UA negative negative mg/dL   Spec Grav, UA [id number] - 1.025   Blood, UA negative negative   pH, UA 5.5 5.0 - 8.0   Protein Ur, POC negative negative mg/dL   Urobilinogen, UA 0.2 0.2 or 1.0 E.U./dL   Nitrite, UA Negative Negative   Leukocytes, UA Negative Negative     Assessment and Plan :   1. Umbilical hernia without obstruction and without gangrene Reducible on exam. Pt does occasionally have symptoms related to umbilical hernia. Will refer to general surgery at this time.  - Ambulatory referral to General Surgery 2. Scoliosis, unspecified scoliosis type, unspecified spinal region 3. HPV (human papilloma virus) infection 4. Anxiety 5. Screening for hematuria or proteinuria - POCT  urinalysis dipstick 6. Encounter to establish care Reviewed patient's history and agreed to take over patient's care.  Plan to return next week for complete physical exam and fasting labs.   Benjiman CoreBrittany Shatoria [place], PA-C  Primary Care at [facility] 06/21/2017 2:06 PM

## 2017-06-21 NOTE — Patient Instructions (Addendum)
  Follow up next week for complete physical exam and fasting labs. No food 8 hrs prior to visit. Try to bring impression reading [facility] or have them fax that info to us. Any documents of recent immunizations would be great too. It was a pleasure meeting you and I look forward to seeing you again.   IF you received an x-ray today, you will receive an invoice from [facility]. Please contact [facility] at [phone] with questions or concerns regarding your invoice.   IF you received labwork today, you will receive an invoice from [facility]. Please contact [facility] at [phone] with questions or concerns regarding your invoice.   Our billing staff will not be able to assist you with questions regarding bills from these companies.  You will be contacted with the lab results as soon as they are available. The fastest way to get your results is to activate your My Chart account. Instructions are located on the last page of this paperwork. If you have not heard from us regarding the results in 2 weeks, please contact this office.

## 2017-06-27 DIAGNOSIS — M5136 Other intervertebral disc degeneration, lumbar region: Secondary | ICD-10-CM | POA: Insufficient documentation

## 2017-06-29 ENCOUNTER — Ambulatory Visit: Payer: Self-pay | Admitting: Physician Assistant

## 2017-07-05 ENCOUNTER — Ambulatory Visit (INDEPENDENT_AMBULATORY_CARE_PROVIDER_SITE_OTHER): Admitting: Physician Assistant

## 2017-07-05 ENCOUNTER — Other Ambulatory Visit: Payer: Self-pay

## 2017-07-05 ENCOUNTER — Encounter: Payer: Self-pay | Admitting: Physician Assistant

## 2017-07-05 VITALS — BP 102/60 | HR 84 | Temp 97.5°F | Resp 18 | Ht 61.02 in | Wt 145.2 lb

## 2017-07-05 DIAGNOSIS — K5900 Constipation, unspecified: Secondary | ICD-10-CM

## 2017-07-05 DIAGNOSIS — Z131 Encounter for screening for diabetes mellitus: Secondary | ICD-10-CM

## 2017-07-05 DIAGNOSIS — Z1329 Encounter for screening for other suspected endocrine disorder: Secondary | ICD-10-CM

## 2017-07-05 DIAGNOSIS — F419 Anxiety disorder, unspecified: Secondary | ICD-10-CM

## 2017-07-05 DIAGNOSIS — Z130 Encounter for screening for diseases of the blood and blood-forming organs and certain disorders involving the immune mechanism: Secondary | ICD-10-CM

## 2017-07-05 DIAGNOSIS — Z833 Family history of diabetes mellitus: Secondary | ICD-10-CM | POA: Diagnosis not present

## 2017-07-05 DIAGNOSIS — L659 Nonscarring hair loss, unspecified: Secondary | ICD-10-CM | POA: Diagnosis not present

## 2017-07-05 DIAGNOSIS — Z13228 Encounter for screening for other metabolic disorders: Secondary | ICD-10-CM

## 2017-07-05 DIAGNOSIS — Z13220 Encounter for screening for lipoid disorders: Secondary | ICD-10-CM

## 2017-07-05 DIAGNOSIS — Z8349 Family history of other endocrine, nutritional and metabolic diseases: Secondary | ICD-10-CM

## 2017-07-05 NOTE — Patient Instructions (Addendum)
We will contact you with your lab results within the next week. It was a pleasure seeing you today. Thank you for letting me participate in your health and well being.    Preventing Type 2 Diabetes Mellitus Type 2 diabetes (type 2 diabetes mellitus) is a long-term (chronic) disease that affects blood sugar (glucose) levels. Normally, a hormone called insulin allows glucose to enter cells in the body. The cells use glucose for energy. In type 2 diabetes, one or both of these problems may be present:  The body does not make enough insulin.  The body does not respond properly to insulin that it makes (insulin resistance).  Insulin resistance or lack of insulin causes excess glucose to build up in the blood instead of going into cells. As a result, high blood glucose (hyperglycemia) develops, which can cause many complications. Being overweight or obese and having an inactive (sedentary) lifestyle can increase your risk for diabetes. Type 2 diabetes can be delayed or prevented by making certain nutrition and lifestyle changes. What nutrition changes can be made?  Eat healthy meals and snacks regularly. Keep a healthy snack with you for when you get hungry between meals, such as fruit or a handful of nuts.  Eat lean meats and proteins that are low in saturated fats, such as chicken, fish, egg whites, and beans. Avoid processed meats.  Eat plenty of fruits and vegetables and plenty of grains that have not been processed (whole grains). It is recommended that you eat: ? 1?2 cups of fruit every day. ? 2?3 cups of vegetables every day. ? 6?8 oz of whole grains every day, such as oats, whole wheat, bulgur, brown rice, quinoa, and millet.  Eat low-fat dairy products, such as milk, yogurt, and cheese.  Eat foods that contain healthy fats, such as nuts, avocado, olive oil, and canola oil.  Drink water throughout the day. Avoid drinks that contain added sugar, such as soda or sweet tea.  Follow  instructions from your health care provider about specific eating or drinking restrictions.  Control how much food you eat at a time (portion size). ? Check food labels to find out the serving sizes of foods. ? Use a kitchen scale to weigh amounts of foods.  Saute or steam food instead of frying it. Cook with water or broth instead of oils or butter.  Limit your intake of: ? Salt (sodium). Have no more than 1 tsp (2,400 mg) of sodium a day. If you have heart disease or high blood pressure, have less than ? tsp (1,500 mg) of sodium a day. ? Saturated fat. This is fat that is solid at room temperature, such as butter or fat on meat. What lifestyle changes can be made?  Activity  Do moderate-intensity physical activity for at least 30 minutes on at least 5 days of the week, or as much as told by your health care provider.  Ask your health care provider what activities are safe for you. A mix of physical activities may be best, such as walking, swimming, cycling, and strength training.  Try to add physical activity into your day. For example: ? Park in spots that are farther away than usual, so that you walk more. For example, park in a far corner of the parking lot when you go to the office or the grocery store. ? Take a walk during your lunch break. ? Use stairs instead of elevators or escalators. Weight Loss  Lose weight as directed. Your health care provider  can determine how much weight loss is best for you and can help you lose weight safely.  If you are overweight or obese, you may be instructed to lose at least 5?7 % of your body weight. Alcohol and Tobacco   Limit alcohol intake to no more than 1 drink a day for nonpregnant women and 2 drinks a day for men. One drink equals 12 oz of beer, 5 oz of wine, or 1 oz of hard liquor.  Do not use any tobacco products, such as cigarettes, chewing tobacco, and e-cigarettes. If you need help quitting, ask your health care provider. Work  With Your Health Care Provider  Have your blood glucose tested regularly, as told by your health care provider.  Discuss your risk factors and how you can reduce your risk for diabetes.  Get screening tests as told by your health care provider. You may have screening tests regularly, especially if you have certain risk factors for type 2 diabetes.  Make an appointment with a diet and nutrition specialist (registered dietitian). A registered dietitian can help you make a healthy eating plan and can help you understand portion sizes and food labels. Why are these changes important?  It is possible to prevent or delay type 2 diabetes and related health problems by making lifestyle and nutrition changes.  It can be difficult to recognize signs of type 2 diabetes. The best way to avoid possible damage to your body is to take actions to prevent the disease before you develop symptoms. What can happen if changes are not made?  Your blood glucose levels may keep increasing. Having high blood glucose for a long time is dangerous. Too much glucose in your blood can damage your blood vessels, heart, kidneys, nerves, and eyes.  You may develop prediabetes or type 2 diabetes. Type 2 diabetes can lead to many chronic health problems and complications, such as: ? Heart disease. ? Stroke. ? Blindness. ? Kidney disease. ? Depression. ? Poor circulation in the feet and legs, which could lead to surgical removal (amputation) in severe cases. Where to find support:  Ask your health care provider to recommend a registered dietitian, diabetes educator, or weight loss program.  Look for local or online weight loss groups.  Join a gym, fitness club, or outdoor activity group, such as a walking club. Where to find more information: To learn more about diabetes and diabetes prevention, visit:  American Diabetes Association (ADA): www.diabetes.[employer] of Diabetes and Digestive and Kidney  Diseases: ToyArticles.ca  To learn more about healthy eating, visit:  The U.[place]. Department of Agriculture Architect), Choose My Plate: http://yates.biz/  Office of Disease Prevention and Health Promotion (ODPHP), Dietary Guidelines: ListingMagazine.si  Summary  You can reduce your risk for type 2 diabetes by increasing your physical activity, eating healthy foods, and losing weight as directed.  Talk with your health care provider about your risk for type 2 diabetes. Ask about any blood tests or screening tests that you need to have. This information is not intended to replace advice given to you by your health care provider. Make sure you discuss any questions you have with your health care provider. Document Released: 09/14/2015 Document Revised: 10/29/2015 Document Reviewed: 07/14/2015 Elsevier Interactive Patient Education  2018 Elsevier Inc.  Preventing High Cholesterol Cholesterol is a waxy, fat-like substance that your body needs in small amounts. Your liver makes all the cholesterol that your body needs. Having high cholesterol (hypercholesterolemia) increases your risk for heart disease and  stroke. Extra (excess) cholesterol comes from the food you eat, such as animal-based fat (saturated fat) from meat and some dairy products. High cholesterol can often be prevented with diet and lifestyle changes. If you already have high cholesterol, you can control it with diet and lifestyle changes, as well as medicine. What nutrition changes can be made?  Eat less saturated fat. Foods that contain saturated fat include red meat and some dairy products.  Avoid processed meats, like bacon and lunch meats.  Avoid trans fats, which are found in margarine and some baked goods.  Avoid foods and beverages that have added sugars.  Eat more fruits, vegetables, and whole grains.  Choose healthy sources of protein, such as fish, poultry, and  nuts.  Choose healthy sources of fat, such as: ? Nuts. ? Vegetable oils, especially olive oil. ? Fish that have healthy fats (omega-3 fatty acids), such as mackerel or salmon. What lifestyle changes can be made?  Lose weight if you are overweight. Losing 5-10 lb (2.3-4.5 kg) can help prevent or control high cholesterol and reduce your risk for diabetes and high blood pressure. Ask your health care provider to help you with a diet and exercise plan to safely lose weight.  Get enough exercise. Do at least 150 minutes of moderate-intensity exercise each week. ? You could do this in short exercise sessions several times a day, or you could do longer exercise sessions a few times a week. For example, you could take a brisk 10-minute walk or bike ride, 3 times a day, for 5 days a week.  Do not smoke. If you need help quitting, ask your health care provider.  Limit your alcohol intake. If you drink alcohol, limit alcohol intake to no more than 1 drink a day for nonpregnant women and 2 drinks a day for men. One drink equals 12 oz of beer, 5 oz of wine, or 1 oz of hard liquor. Why are these changes important? If you have high cholesterol, deposits (plaques) may build up on the walls of your blood vessels. Plaques make the arteries narrower and stiffer, which can restrict or block blood flow and cause blood clots to form. This greatly increases your risk for heart attack and stroke. Making diet and lifestyle changes can reduce your risk for these life-threatening conditions. What can I do to lower my risk?  Manage your risk factors for high cholesterol. Talk with your health care provider about all of your risk factors and how to lower your risk.  Manage other conditions that you have, such as diabetes or high blood pressure (hypertension).  Have your cholesterol checked at regular intervals.  Keep all follow-up visits as told by your health care provider. This is important. How is this  treated? In addition to diet and lifestyle changes, your health care provider may recommend medicines to help lower cholesterol, such as a medicine to reduce the amount of cholesterol made in your liver. You may need medicine if:  Diet and lifestyle changes do not lower your cholesterol enough.  You have high cholesterol and other risk factors for heart disease or stroke.  Take over-the-counter and prescription medicines only as told by your health care provider. Where to find more information:  American Heart Association: 1122334455.jsp  National Heart, Lung, and Blood Institute: http://hood.com/ Summary  High cholesterol increases your risk for heart disease and stroke. By keeping your cholesterol level low, you can reduce your risk for these conditions.  Diet and lifestyle changes are the  most important steps in preventing high cholesterol.  Work with your health care provider to manage your risk factors, and have your blood tested regularly. This information is not intended to replace advice given to you by your health care provider. Make sure you discuss any questions you have with your health care provider. Document Released: 06/07/2015 Document Revised: 01/30/2016 Document Reviewed: 01/30/2016 Elsevier Interactive Patient Education  2018 [employer]  Exercising to [employer] Healthy Exercising regularly is important. It has many health benefits, such as:  Improving your overall fitness, flexibility, and endurance.  Increasing your bone density.  Helping with weight control.  Decreasing your body fat.  Increasing your muscle strength.  Reducing stress and tension.  Improving your overall health.  In order to become healthy and stay healthy, it is recommended that you do moderate-intensity and vigorous-intensity exercise. You can tell that you are  exercising at a moderate intensity if you have a higher heart rate and faster breathing, but you are still able to hold a conversation. You can tell that you are exercising at a vigorous intensity if you are breathing much harder and faster and cannot hold a conversation while exercising. How often should I exercise? Choose an activity that you enjoy and set realistic goals. Your health care provider can help you to make an activity plan that works for you. Exercise regularly as directed by your health care provider. This may include:  Doing resistance training twice each week, such as: ? Push-ups. ? Sit-ups. ? Lifting weights. ? Using resistance bands.  Doing a given intensity of exercise for a given amount of time. Choose from these options: ? 150 minutes of moderate-intensity exercise every week. ? 75 minutes of vigorous-intensity exercise every week. ? A mix of moderate-intensity and vigorous-intensity exercise every week.  Children, pregnant women, people who are out of shape, people who are overweight, and older adults may need to consult a health care provider for individual recommendations. If you have any sort of medical condition, be sure to consult your health care provider before starting a new exercise program. What are some exercise ideas? Some moderate-intensity exercise ideas include:  Walking at a rate of 1 mile in 15 minutes.  Biking.  Hiking.  Golfing.  Dancing.  Some vigorous-intensity exercise ideas include:  Walking at a rate of at least 4.5 miles per hour.  Jogging or running at a rate of 5 miles per hour.  Biking at a rate of at least 10 miles per hour.  Lap swimming.  Roller-skating or in-line skating.  Cross-country skiing.  Vigorous competitive sports, such as football, basketball, and soccer.  Jumping rope.  Aerobic dancing.  What are some everyday activities that can help me to get exercise?  Yard work, such as: ? Pushing a Heritage managerlawn  mower. ? Raking and bagging leaves.  Washing and waxing your car.  Pushing a stroller.  Shoveling snow.  Gardening.  Washing windows or floors. How can I be more active in my day-to-day activities?  Use the stairs instead of the elevator.  Take a walk during your lunch break.  If you drive, park your car farther away from work or school.  If you take public transportation, get off one stop early and walk the rest of the way.  Make all of your phone calls while standing up and walking around.  Get up, stretch, and walk around every 30 minutes throughout the day. What guidelines should I follow while exercising?  Do not exercise so much  that you hurt yourself, feel dizzy, or get very short of breath.  Consult your health care provider before starting a new exercise program.  Wear comfortable clothes and shoes with good support.  Drink plenty of water while you exercise to prevent dehydration or heat stroke. Body water is lost during exercise and must be replaced.  Work out until you breathe faster and your heart beats faster. This information is not intended to replace advice given to you by your health care provider. Make sure you discuss any questions you have with your health care provider. Document Released: 06/25/2010 Document Revised: 10/29/2015 Document Reviewed: 10/24/2013 Elsevier Interactive Patient Education  2018 [employer].   IF you received an x-ray today, you will receive an invoice from [facility]. Please contact [facility] at [phone] with questions or concerns regarding your invoice.   IF you received labwork today, you will receive an invoice from [facility]. Please contact [facility] at [phone] with questions or concerns regarding your invoice.   Our billing staff will not be able to assist you with questions regarding bills from these companies.  You will be contacted with the lab results as soon as they are available. The  fastest way to get your results is to activate your My Chart account. Instructions are located on the last page of this paperwork. If you have not heard from [place] regarding the results in 2 weeks, please contact this office.

## 2017-07-05 NOTE — Progress Notes (Signed)
Molly Rojas  MRN: [id number] DOB: [birth year]  Subjective:  Pt is31 y.o. female who presents for fasting labs..  She has been seen by her gynecologist for an annual physical exam this year already but they did not do the labs.  She wants lab work today.  Mostly concerned about getting her A1c and TSH checked.  Her has a strong family history of diabetes and thyroid disease.  Would like other basic labs as well.  Diet: Shakes in the morning, fried egg and toast, likes salads. Eats a good variety of fruits and veggies. Gets enough dairy during the week. Does not do a daily multivitamin. Drinks mostly coffee and water.  Exercise: Goes to gym twice a week. Does cardio and some weights.  Sleep: Gets about 6 hours a night.  Menstrual cycles: LMP 06/29/17. Cycles are now regular, lat 7 days.  Will still have menorrhagia occassionally. Denies dysmenorrhea. Had tubal ligation after last pregnancy.Sees gynecologist, Dr. Julien Girt, yearly.  Last dental exam: 01/2017 Last vision exam: >2 years ago Last pap smear: 2018  Vaccinations      Tetanus: 2017      Influenza: Last year, declines today  Chronic medical conditions: 1) Anxiety: Has had it for a few years, was worse with pregnancy. Used to be on zoloft but does not feel as if she needs medication at this time because it is well controlled without any management. Has also tried therapy in the past.  2) Pregnancy related GERD: No longer an issue.  3) Umbilical hernia: Has had this for 3 years. WAs referred to a surgeon in [facility] but did not like them so she never went back. It is painful occasionally, esp if she has not had a BM that day. Denies redness, warmth, or strangulation of hernia. Was referred to general surgery at last visit. Has an appointment on 07/12/17. 4) Scoliosis: Followed by [facility] for this year. Taking magnesium 572m QD and vitamin D3 5,0030mQD.    Patient Active Problem List   Diagnosis Date Noted  .  Umbilical hernia   . Scoliosis   . HPV (human papilloma virus) infection   . Anxiety   . Varicose veins of right lower extremity with complications 0760/63/0160. Preterm contractions 08/17/2016  . S/P cesarean section 07/22/2015    Current Outpatient Medications on File Prior to Visit  Medication Sig Dispense Refill  . Cholecalciferol (VITAMIN D3 PO) Take 5,000 mg by mouth.    . magnesium gluconate (MAGONATE) 500 MG tablet Take 500 mg by mouth 2 (two) times daily.     No current facility-administered medications on file prior to visit.     Allergies  Allergen Reactions  . Percocet [Oxycodone-Acetaminophen] Other (See Comments)    hallucinations    Social History   Socioeconomic History  . Marital status: Married    Spouse name: MiAlroy Dust. Number of children: 3  . Years of education: Not on file  . Highest education level: Not on file  Social Needs  . Financial resource strain: Not on file  . Food insecurity - worry: Not on file  . Food insecurity - inability: Not on file  . Transportation needs - medical: Not on file  . Transportation needs - non-medical: Not on file  Occupational History  . Not on file  Tobacco Use  . Smoking status: Never Smoker  . Smokeless tobacco: Never Used  Substance and Sexual Activity  . Alcohol use: Yes    Alcohol/week: 1.8  oz    Types: 3 Glasses of wine per week    Comment: occasional   . Drug use: No  . Sexual activity: Yes    Comment: with monogamous husband  Other Topics Concern  . Not on file  Social History Narrative   She is from [place], [place] but has been in [place] for 14 years. Lives at home with husband and 3 kids. Not fasting today.     Past Surgical History:  Procedure Laterality Date  . BACK SURGERY    . CESAREAN SECTION    . CESAREAN SECTION  03/28/2011   Procedure: CESAREAN SECTION;  Surgeon: Darlyn Chamber;  Location: [facility];  Service: Gynecology;  Laterality: N/A;  . CESAREAN SECTION N/A 07/22/2015    Procedure: CESAREAN SECTION;  Surgeon: Marylynn Pearson, MD;  Location: [facility];  Service: Obstetrics;  Laterality: N/A;  Repeat edc 07/28/15 NKDA  . CESAREAN SECTION N/A 08/18/2016   Procedure: CESAREAN SECTION;  Surgeon: Molli Posey, MD;  Location: [facility];  Service: Obstetrics;  Laterality: N/A;  . DENTAL SURGERY    . ENDOVENOUS ABLATION SAPHENOUS VEIN W/ LASER Right 06/05/2017   endovenous laser ablation R GSV and stab phlebectomy 10-20 incisions R leg by Tinnie Gens MD   . NO PAST SURGERIES    . SPINAL FUSION  placed rods from T-11-L4   for scoliosis, spinal fusion with screws and rods  . WISDOM TOOTH EXTRACTION      Family History  Problem Relation Age of Onset  . Diabetes Mother   . Thyroid disease Mother   . Hyperlipidemia Father   . Hypertension Father   . Diabetes Maternal Aunt   . Diabetes Maternal Uncle   . Diabetes Maternal Grandfather   . Hyperlipidemia Paternal Grandmother   . Heart disease Paternal Grandfather   . Hyperlipidemia Paternal Grandfather   . Hypertension Paternal Grandfather   . Mental illness Brother   . Anesthesia problems Neg Hx   . Hypotension Neg Hx   . Malignant hyperthermia Neg Hx   . Pseudochol deficiency Neg Hx     Review of Systems  Constitutional: Negative for activity change, appetite change, chills, diaphoresis, fatigue, fever and unexpected weight change.  HENT: Negative for congestion, dental problem, drooling, ear discharge, ear pain, facial swelling, hearing loss, mouth sores, nosebleeds, postnasal drip, rhinorrhea, sinus pressure, sinus pain, sneezing, sore throat, tinnitus, trouble swallowing and voice change.   Eyes: Negative for photophobia, pain, discharge, redness, itching and visual disturbance.  Respiratory: Negative for apnea, cough, choking, chest tightness, shortness of breath, wheezing and stridor.   Cardiovascular: Negative for chest pain, palpitations and leg swelling.  Gastrointestinal: Positive for  constipation. Negative for abdominal distention, abdominal pain, anal bleeding, blood in stool, diarrhea, nausea, rectal pain and vomiting.  Endocrine: Negative for cold intolerance, heat intolerance, polydipsia, polyphagia and polyuria.       Positive for hair falling out and growing back.  Genitourinary: Negative for decreased urine volume, difficulty urinating, dyspareunia, dysuria, enuresis, flank pain, frequency, genital sores, hematuria, menstrual problem, pelvic pain, urgency, vaginal bleeding, vaginal discharge and vaginal pain.  Musculoskeletal: Negative for arthralgias, back pain, gait problem, joint swelling, myalgias, neck pain and neck stiffness.  Skin: Negative for color change, pallor, rash and wound.  Allergic/Immunologic: Negative for environmental allergies, food allergies and immunocompromised state.  Neurological: Negative for dizziness, tremors, seizures, syncope, facial asymmetry, speech difficulty, weakness, light-headedness, numbness and headaches.  Hematological: Negative for adenopathy. Does not bruise/bleed easily.  Psychiatric/Behavioral: Positive for  sleep disturbance ( wakes up feeling sluggish). Negative for agitation, behavioral problems, confusion, decreased concentration, dysphoric mood, hallucinations, self-injury and suicidal ideas. The patient is not nervous/anxious and is not hyperactive.     Objective:  BP 102/60   Pulse 84   Temp (!) 97.5 F (36.4 C) (Oral)   Resp 18   Ht 5' 1.02" (1.55 m)   Wt 145 lb 3.2 oz (65.9 kg)   LMP 06/29/2017   SpO2 100%   BMI 27.42 kg/m   Physical Exam  Constitutional: She is oriented to person, place, and time and well-developed, well-nourished, and in no distress.  HENT:  Head: Normocephalic and atraumatic.  Right Ear: Tympanic membrane, external ear and ear canal normal.  Left Ear: Tympanic membrane, external ear and ear canal normal.  Nose: Nose normal.  Mouth/Throat: Uvula is midline, oropharynx is clear and moist  and mucous membranes are normal.  Eyes: Conjunctivae are normal.  Neck: Normal range of motion. No thyroid mass and no thyromegaly present.  Cardiovascular: Normal rate, regular rhythm and normal heart sounds.  Pulmonary/Chest: Effort normal and breath sounds normal. She has no wheezes. She has no rales.  Neurological: She is alert and oriented to person, place, and time. Gait normal.  Skin: Skin is warm and dry.  Psychiatric: Affect normal.  Vitals reviewed.  No exam data present   Assessment and Plan :  Await lab results.  1. Anxiety 2. Hair thinning - TSH 3. Constipation, unspecified constipation type - TSH 4. Screening, anemia, deficiency, iron - CBC with Differential/Platelet 5. Screening for metabolic disorder - EHU31+SHFW 6. Screening, lipid - Lipid panel 7. Screening for thyroid disorder - TSH 8. Screening for diabetes mellitus - Hemoglobin A1c 9. Family history of diabetes mellitus 10. Family history of thyroid disease  Tenna Delaine, [place]  Primary Care at [facility] 07/05/2017 8:38 AM

## 2017-07-06 LAB — CBC WITH DIFFERENTIAL/PLATELET
BASOS ABS: 0 10*3/uL (ref 0.0–0.2)
Basos: 0 %
EOS (ABSOLUTE): 0.2 10*3/uL (ref 0.0–0.4)
Eos: 4 %
HEMOGLOBIN: 13.5 g/dL (ref 11.1–15.9)
Hematocrit: 40.4 % (ref 34.0–46.6)
Immature Grans (Abs): 0 10*3/uL (ref 0.0–0.1)
Immature Granulocytes: 0 %
LYMPHS ABS: 2 10*3/uL (ref 0.7–3.1)
Lymphs: 36 %
MCH: 28.8 pg (ref 26.6–33.0)
MCHC: 33.4 g/dL (ref 31.5–35.7)
MCV: 86 fL (ref 79–97)
MONOS ABS: 0.4 10*3/uL (ref 0.1–0.9)
Monocytes: 7 %
NEUTROS ABS: 2.8 10*3/uL (ref 1.4–7.0)
Neutrophils: 53 %
PLATELETS: 295 10*3/uL (ref 150–379)
RBC: 4.69 x10E6/uL (ref 3.77–5.28)
RDW: 14.5 % (ref 12.3–15.4)
WBC: 5.5 10*3/uL (ref 3.4–10.8)

## 2017-07-06 LAB — CMP14+EGFR
ALBUMIN: 4.7 g/dL (ref 3.5–5.5)
ALK PHOS: 60 IU/L (ref 39–117)
ALT: 11 IU/L (ref 0–32)
AST: 17 IU/L (ref 0–40)
Albumin/Globulin Ratio: 1.9 (ref 1.2–2.2)
BILIRUBIN TOTAL: 0.3 mg/dL (ref 0.0–1.2)
BUN / CREAT RATIO: 10 (ref 9–23)
BUN: 8 mg/dL (ref 6–20)
CHLORIDE: 101 mmol/L (ref 96–106)
CO2: 24 mmol/L (ref 20–29)
Calcium: 9 mg/dL (ref 8.7–10.2)
Creatinine, Ser: 0.77 mg/dL (ref 0.57–1.00)
GFR calc Af Amer: 120 mL/min/{1.73_m2} (ref >59)
GFR calc non Af Amer: 104 mL/min/{1.73_m2} (ref >59)
GLUCOSE: 82 mg/dL (ref 65–99)
Globulin, Total: 2.5 g/dL (ref 1.5–4.5)
Potassium: 4.4 mmol/L (ref 3.5–5.2)
Sodium: 140 mmol/L (ref 134–144)
Total Protein: 7.2 g/dL (ref 6.0–8.5)

## 2017-07-06 LAB — TSH: TSH: 3.03 u[IU]/mL (ref 0.450–4.500)

## 2017-07-06 LAB — HEMOGLOBIN A1C
ESTIMATED AVERAGE GLUCOSE: 105 mg/dL
HEMOGLOBIN A1C: 5.3 % (ref 4.8–5.6)

## 2017-07-06 LAB — LIPID PANEL
CHOLESTEROL TOTAL: 194 mg/dL (ref 100–199)
Chol/HDL Ratio: 2.8 ratio (ref 0.0–4.4)
HDL: 69 mg/dL (ref >39)
LDL Calculated: 111 mg/dL — ABNORMAL HIGH (ref 0–99)
Triglycerides: 68 mg/dL (ref 0–149)
VLDL Cholesterol Cal: 14 mg/dL (ref 5–40)

## 2017-07-20 ENCOUNTER — Telehealth: Payer: Self-pay | Admitting: Physician Assistant

## 2017-07-20 DIAGNOSIS — Z419 Encounter for procedure for purposes other than remedying health state, unspecified: Secondary | ICD-10-CM

## 2017-07-20 NOTE — Telephone Encounter (Signed)
Copied from CRM [phone]. Topic: Referral - Request >> Jul 20, 2017  9:42 AM Maia Pettiesrtiz, Kristie S wrote: Reason for CRM: pt called her ortho and they are not able to print xray report but they did confirm pt has calcium in thyroid. Pt is requesting a referral to Endo.

## 2017-07-20 NOTE — Telephone Encounter (Signed)
Message to [place] - pt requesting referral to Endocrine specialist

## 2017-07-21 NOTE — Telephone Encounter (Signed)
LMOVM re: Endocrin referral made.  Should hear in 1-2 weeks

## 2017-07-21 NOTE — Telephone Encounter (Signed)
Please call patient.  I have placed a referral for endocrinology per patient's request.

## 2017-08-09 ENCOUNTER — Ambulatory Visit (INDEPENDENT_AMBULATORY_CARE_PROVIDER_SITE_OTHER)

## 2017-08-09 DIAGNOSIS — Z23 Encounter for immunization: Secondary | ICD-10-CM

## 2017-08-16 ENCOUNTER — Telehealth: Payer: Self-pay | Admitting: Physician Assistant

## 2017-08-16 NOTE — Telephone Encounter (Signed)
Copied from CRM [phone]. Topic: Referral - Question >> Aug 16, 2017  1:53 PM Louie BunPalacios Medina, Rosey Batheresa D wrote: Reason for CRM: Patient called and was asking the status of her referral to go and see an Endocrinologist. She stated that it has been 3 weeks and she has not heard anything back. Please call patient back, thanks.

## 2017-08-18 NOTE — Telephone Encounter (Signed)
[facility] declined pt referral called pt to let her know that her referral was declined at [facility] and that I am [facility]

## 2017-08-21 ENCOUNTER — Telehealth: Payer: Self-pay | Admitting: Physician Assistant

## 2017-08-21 ENCOUNTER — Ambulatory Visit (INDEPENDENT_AMBULATORY_CARE_PROVIDER_SITE_OTHER): Admitting: Physician Assistant

## 2017-08-21 ENCOUNTER — Encounter: Payer: Self-pay | Admitting: Physician Assistant

## 2017-08-21 ENCOUNTER — Ambulatory Visit: Payer: Self-pay | Admitting: Physician Assistant

## 2017-08-21 ENCOUNTER — Other Ambulatory Visit: Payer: Self-pay

## 2017-08-21 VITALS — BP 116/71 | HR 87 | Temp 98.4°F | Resp 16 | Ht 61.0 in | Wt 145.6 lb

## 2017-08-21 DIAGNOSIS — R6889 Other general symptoms and signs: Secondary | ICD-10-CM | POA: Diagnosis not present

## 2017-08-21 DIAGNOSIS — Z20828 Contact with and (suspected) exposure to other viral communicable diseases: Secondary | ICD-10-CM

## 2017-08-21 LAB — POC INFLUENZA A&B (BINAX/QUICKVUE)
INFLUENZA A, POC: NEGATIVE
Influenza B, POC: NEGATIVE

## 2017-08-21 MED ORDER — OSELTAMIVIR PHOSPHATE 75 MG PO CAPS: 75.0000 mg | ORAL_CAPSULE | Freq: Every day | ORAL | 0 refills | Status: DC

## 2017-08-21 NOTE — Telephone Encounter (Signed)
Copied from CRM [phone]. Topic: Quick Communication - See Telephone Encounter >> Aug 21, 2017 11:08 AM Maia Pettiesrtiz, Kristie S wrote: CRM for notification. See Telephone encounter for: 08/21/17.  Pt husband and both sons have been diagnosed with Flu A (husband yesterday, sons this morning). Pt is having cough, runny nose, body aches, and fever also (100). She is requesting RX for Tamiflu.   [facility] [address] - [place], [place] - [id number] [phone] (Phone) [phone] (Fax)

## 2017-08-21 NOTE — Patient Instructions (Addendum)
If you develop fever of 100.4 or greater then okay to start taking the tamiflu once in the morning and once at night. Take daily for now to prevent contracting the flu.     IF you received an x-ray today, you will receive an invoice from [facility]. Please contact [facility] at [phone] with questions or concerns regarding your invoice.   IF you received labwork today, you will receive an invoice from [facility]. Please contact [facility] at [phone] with questions or concerns regarding your invoice.   Our billing staff will not be able to assist you with questions regarding bills from these companies.  You will be contacted with the lab results as soon as they are available. The fastest way to get your results is to activate your My Chart account. Instructions are located on the last page of this paperwork. If you have not heard from us regarding the results in 2 weeks, please contact this office.

## 2017-08-21 NOTE — Telephone Encounter (Signed)
Spoke with patient advised she will need to be seen due to symptoms transferred to schedule

## 2017-08-21 NOTE — Progress Notes (Signed)
08/23/2017 9:24 AM   DOB: 1985/09/26 / MRN: [id number]  SUBJECTIVE:  Molly Rojas is a 31 y.o. female presenting for cough, nasal congestion, body aches.  The symptoms started about 12 hours ago.  She tells me that her husband and both of her sons have recently been diagnosed with influenza A.  She is requesting Tamiflu at this time.  She is allergic to percocet [oxycodone-acetaminophen].   She  has a past medical history of Abnormal Pap smear, Anxiety, GERD (gastroesophageal reflux disease), Headache(784.0), HPV (human papilloma virus) infection, No pertinent past medical history, Scoliosis, Scoliosis, and Umbilical hernia.    She  reports that  has never smoked. she has never used smokeless tobacco. She reports that she drinks about 1.8 oz of alcohol per week. She reports that she does not use drugs. She  reports that she currently engages in sexual activity. The patient  has a past surgical history that includes Cesarean section; Spinal fusion (placed rods from T-11-L4); No past surgeries; Back surgery; Wisdom tooth extraction; Cesarean section (03/28/2011); Dental surgery; Cesarean section (N/A, 07/22/2015); Cesarean section (N/A, 08/18/2016); and Endovenous ablation saphenous vein w/ laser (Right, 06/05/2017).  Her family history includes Diabetes in her maternal aunt, maternal grandfather, maternal uncle, and mother; Heart disease in her paternal grandfather; Hyperlipidemia in her father, paternal grandfather, and paternal grandmother; Hypertension in her father and paternal grandfather; Mental illness in her brother; Thyroid disease in her mother.  ROS: Per HPI  The problem list and medications were reviewed and updated by myself where necessary and exist elsewhere in the encounter.   OBJECTIVE:  BP 116/71   Pulse 87   Temp 98.4 F (36.9 C) (Oral)   Resp 16   Ht 5\' 1"  (1.549 m)   Wt 145 lb 9.6 oz (66 kg)   LMP 07/22/2017   SpO2 98%   BMI 27.51 kg/m   Physical Exam    Constitutional: She is active.  Non-toxic appearance.  HENT:  Right Ear: Hearing, tympanic membrane, external ear and ear canal normal.  Left Ear: Hearing, tympanic membrane, external ear and ear canal normal.  Nose: Nose normal. Right sinus exhibits no maxillary sinus tenderness and no frontal sinus tenderness. Left sinus exhibits no maxillary sinus tenderness and no frontal sinus tenderness.  Mouth/Throat: Uvula is midline, oropharynx is clear and moist and mucous membranes are normal. Mucous membranes are not dry. No oropharyngeal exudate, posterior oropharyngeal edema or tonsillar abscesses.  Cardiovascular: Normal rate, regular rhythm, S1 normal, S2 normal, normal heart sounds and intact distal pulses. Exam reveals no gallop, no friction rub and no decreased pulses.  No murmur heard. Pulmonary/Chest: Effort normal. No stridor. No tachypnea. No respiratory distress. She has no wheezes. She has no rales.  Abdominal: She exhibits no distension.  Musculoskeletal: She exhibits no edema.  Lymphadenopathy:       Head (right side): No submandibular and no tonsillar adenopathy present.       Head (left side): No submandibular and no tonsillar adenopathy present.    She has no cervical adenopathy.  Neurological: She is alert.  Skin: Skin is warm and dry. She is not diaphoretic. No pallor.    Results for orders placed or performed in visit on 08/21/17 (from the past 72 hour(s))  POC Influenza A&B (Binax test)     Status: None   Collection Time: 08/21/17  5:28 PM  Result Value Ref Range   Influenza A, POC Negative Negative   Influenza B, POC Negative Negative  No results found.  ASSESSMENT AND PLAN:  Molly Rojas was seen today for runny nose, sore throat and cough.  Diagnoses and all orders for this visit:  Flu-like symptoms: She tells me she has a job interview in a few days and wants to make sure that she is well for that.  I do not think she has the flu at this time.  Advised that it  is reasonable for her to take a prophylactic dose 1 tab daily for 10 days to prevent transmission from her family.  She did have the flu shot about 3 weeks ago. -     POC Influenza A&B (Binax test)  Exposure to the flu -     oseltamivir (TAMIFLU) 75 MG capsule; Take 1 capsule (75 mg total) by mouth daily.    The patient is advised to call or return to clinic if she does not see an improvement in symptoms, or to seek the care of the closest emergency department if she worsens with the above plan.   Deliah BostonMichael Clark, MHS, PA-C Primary Care at [facility] 08/23/2017 9:24 AM

## 2017-09-13 ENCOUNTER — Other Ambulatory Visit: Payer: Self-pay | Admitting: Internal Medicine

## 2017-09-13 DIAGNOSIS — E010 Iodine-deficiency related diffuse (endemic) goiter: Secondary | ICD-10-CM

## 2017-09-20 ENCOUNTER — Ambulatory Visit
Admission: RE | Admit: 2017-09-20 | Discharge: 2017-09-20 | Disposition: A | Discharge status: Home / Self Care | Source: Ambulatory Visit | Attending: Internal Medicine | Admitting: Internal Medicine

## 2017-09-20 DIAGNOSIS — E010 Iodine-deficiency related diffuse (endemic) goiter: Secondary | ICD-10-CM

## 2017-10-25 ENCOUNTER — Other Ambulatory Visit: Payer: Self-pay

## 2017-10-25 ENCOUNTER — Encounter (HOSPITAL_COMMUNITY): Payer: Self-pay

## 2017-10-25 ENCOUNTER — Inpatient Hospital Stay (HOSPITAL_COMMUNITY)
Admission: AD | Admit: 2017-10-25 | Discharge: 2017-10-25 | Disposition: A | Discharge status: Home / Self Care | Source: Ambulatory Visit | Attending: Obstetrics and Gynecology | Admitting: Obstetrics and Gynecology

## 2017-10-25 DIAGNOSIS — N939 Abnormal uterine and vaginal bleeding, unspecified: Secondary | ICD-10-CM | POA: Diagnosis not present

## 2017-10-25 DIAGNOSIS — R109 Unspecified abdominal pain: Secondary | ICD-10-CM | POA: Diagnosis present

## 2017-10-25 DIAGNOSIS — Z3202 Encounter for pregnancy test, result negative: Secondary | ICD-10-CM | POA: Diagnosis not present

## 2017-10-25 DIAGNOSIS — Z885 Allergy status to narcotic agent status: Secondary | ICD-10-CM | POA: Diagnosis not present

## 2017-10-25 HISTORY — DX: Anemia, unspecified: D64.9

## 2017-10-25 HISTORY — DX: Unspecified abnormal cytological findings in specimens from vagina: R87.629

## 2017-10-25 LAB — CBC
HEMATOCRIT: 40.4 % (ref 36.0–46.0)
Hemoglobin: 13.5 g/dL (ref 12.0–15.0)
MCH: 30.1 pg (ref 26.0–34.0)
MCHC: 33.4 g/dL (ref 30.0–36.0)
MCV: 90 fL (ref 78.0–100.0)
PLATELETS: 285 10*3/uL (ref 150–400)
RBC: 4.49 MIL/uL (ref 3.87–5.11)
RDW: 13.3 % (ref 11.5–15.5)
WBC: 7.1 10*3/uL (ref 4.0–10.5)

## 2017-10-25 LAB — URINALYSIS, ROUTINE W REFLEX MICROSCOPIC
Bilirubin Urine: NEGATIVE
Glucose, UA: NEGATIVE mg/dL
KETONES UR: NEGATIVE mg/dL
Leukocytes, UA: NEGATIVE
Nitrite: NEGATIVE
PH: 7 (ref 5.0–8.0)
Protein, ur: NEGATIVE mg/dL
RBC / HPF: >50 RBC/hpf — ABNORMAL HIGH (ref 0–5)
SPECIFIC GRAVITY, URINE: 1.01 (ref 1.005–1.030)

## 2017-10-25 LAB — POCT PREGNANCY, URINE: Preg Test, Ur: NEGATIVE

## 2017-10-25 LAB — WET PREP, GENITAL
CLUE CELLS WET PREP: NONE SEEN
Sperm: NONE SEEN
Trich, Wet Prep: NONE SEEN
Yeast Wet Prep HPF POC: NONE SEEN

## 2017-10-25 MED ORDER — TRANEXAMIC ACID 650 MG PO TABS: 1300.0000 mg | ORAL_TABLET | Freq: Three times a day (TID) | ORAL | 0 refills | Status: AC

## 2017-10-25 NOTE — MAU Note (Addendum)
Pt confirms that this current bleeding is her menstrual cycle, but that it has never been this heavy before.  Mild abdm cramping noted. Denies dizziness or lightheadedness.  Pt having uterine procedure this Friday for hx of heavy periods.

## 2017-10-25 NOTE — MAU Note (Signed)
For the last month or two, has spotted wkly.  Had annual GYN appt last wk,  Scheduled for vag US/ ?hysteroscoopy on Fri. Started bleeding on Mon, since then has been consistent, passing egg sized clots. Soaking tampon and pad every to an hour.

## 2017-10-25 NOTE — MAU Provider Note (Signed)
History     CSN: [id number]  Arrival date and time: 10/25/17 1813   First Provider Initiated Contact with Patient 10/25/17 1856      Chief Complaint  Patient presents with  . Vaginal Bleeding  . Abdominal Pain   HPI  Ms.  Molly Rojas is a 32 y.o. year old G9P2103 non-pregnant female who presents to MAU reporting spotting off and on for the last 1-2 months. She reports when her period started this time, it was "much heavier". She reports passing egg sized clots and soaking a tampon every 45 mins  Past Medical History:  Diagnosis Date  . Abnormal Pap smear   . Anemia    with pregnancy  . Anxiety   . GERD (gastroesophageal reflux disease)    with pregnancy   . Headache(784.0)   . HPV (human papilloma virus) infection   . No pertinent past medical history   . Scoliosis   . Scoliosis   . Umbilical hernia   . Vaginal Pap smear, abnormal     Past Surgical History:  Procedure Laterality Date  . BACK SURGERY    . CESAREAN SECTION    . CESAREAN SECTION  03/28/2011   Procedure: CESAREAN SECTION;  Surgeon: Juluis Mire;  Location: [facility];  Service: Gynecology;  Laterality: N/A;  . CESAREAN SECTION N/A 07/22/2015   Procedure: CESAREAN SECTION;  Surgeon: Zelphia Cairo, MD;  Location: [facility];  Service: Obstetrics;  Laterality: N/A;  Repeat edc 07/28/15 NKDA  . CESAREAN SECTION N/A 08/18/2016   Procedure: CESAREAN SECTION;  Surgeon: Richarda Overlie, MD;  Location: [facility];  Service: Obstetrics;  Laterality: N/A;  . DENTAL SURGERY    . ENDOVENOUS ABLATION SAPHENOUS VEIN W/ LASER Right 06/05/2017   endovenous laser ablation R GSV and stab phlebectomy 10-20 incisions R leg by Josephina Gip MD   . NO PAST SURGERIES    . SPINAL FUSION  placed rods from T-11-L4   for scoliosis, spinal fusion with screws and rods  . WISDOM TOOTH EXTRACTION      Family History  Problem Relation Age of Onset  . Diabetes Mother   . Thyroid disease Mother   . Hyperlipidemia Father   .  Hypertension Father   . Diabetes Maternal Aunt   . Diabetes Maternal Uncle   . Diabetes Maternal Grandfather   . Hyperlipidemia Paternal Grandmother   . Heart disease Paternal Grandfather   . Hyperlipidemia Paternal Grandfather   . Hypertension Paternal Grandfather   . Mental illness Brother   . Anesthesia problems Neg Hx   . Hypotension Neg Hx   . Malignant hyperthermia Neg Hx   . Pseudochol deficiency Neg Hx     Social History   Tobacco Use  . Smoking status: Never Smoker  . Smokeless tobacco: Never Used  Substance Use Topics  . Alcohol use: Yes    Alcohol/week: 1.8 oz    Types: 3 Glasses of wine per week    Comment: occasional   . Drug use: No    Allergies:  Allergies  Allergen Reactions  . Percocet [Oxycodone-Acetaminophen] Other (See Comments)    hallucinations    Medications Prior to Admission  Medication Sig Dispense Refill Last Dose  . Cholecalciferol (VITAMIN D3 PO) Take 5,000 mg by mouth.   Taking  . magnesium gluconate (MAGONATE) 500 MG tablet Take 500 mg by mouth 2 (two) times daily.   Taking  . meloxicam (MOBIC) 7.5 MG tablet Take 7.5 mg by mouth daily.   Taking  .  oseltamivir (TAMIFLU) 75 MG capsule Take 1 capsule (75 mg total) by mouth daily. 10 capsule 0   . Probiotic Product (PROBIOTIC-10) CAPS Take by mouth.   Taking    Review of Systems  Constitutional: Negative.   HENT: Negative.   Eyes: Negative.   Respiratory: Negative.   Cardiovascular: Negative.   Gastrointestinal: Negative.   Endocrine: Negative.   Genitourinary: Positive for vaginal bleeding (heavy bleeding with "egg-sized" clots; soaking a super tampon with a pad every 45 mins").  Musculoskeletal: Negative.   Skin: Negative.   Allergic/Immunologic: Negative.   Neurological: Negative.   Hematological: Negative.   Psychiatric/Behavioral: Negative.    Physical Exam   Blood pressure 121/74, pulse 93, temperature 98.7 F (37.1 C), temperature source Oral, resp. rate 18, weight 147  lb 12 oz (67 kg), last menstrual period 10/23/2017, SpO2 99 %, not currently breastfeeding.  Physical Exam  Nursing note and vitals reviewed. Constitutional: She is oriented to person, place, and time. She appears well-developed and well-nourished.  HENT:  Head: Normocephalic and atraumatic.  Eyes: Pupils are equal, round, and reactive to light.  Neck: Normal range of motion.  Cardiovascular: Normal rate, regular rhythm, normal heart sounds and intact distal pulses.  Respiratory: Effort normal and breath sounds normal.  GI: Soft. Bowel sounds are normal.  Genitourinary:  Genitourinary Comments: Uterus: enlarged, ~10 wks size, SE: cervix is smooth, pink, no lesions, moderate amt of dark, red blood with medium size clots -- WP, GC/CT done, closed/long/firm, no CMT or friability, no adnexal tenderness   Musculoskeletal: Normal range of motion.  Neurological: She is alert and oriented to person, place, and time.  Skin: Skin is warm and dry.  Psychiatric: She has a normal mood and affect. Her behavior is normal. Judgment and thought content normal.    MAU Course  Procedures  MDM CCUA UPT CBC w/Diff ABO/Rh HCG Wet Prep GC/CT -- pending  *Consult with Dr. Henderson Cloud @ 2015 - notified of patient's complaints, assessments, lab & U/S results, recommended tx plan Rx Lysteda and keep appt on Friday, d/c home   Results for orders placed or performed during the hospital encounter of 10/25/17 (from the past 24 hour(s))  Urinalysis, Routine w reflex microscopic     Status: Abnormal   Collection Time: 10/25/17  6:30 PM  Result Value Ref Range   Color, Urine YELLOW YELLOW   APPearance CLEAR CLEAR   Specific Gravity, Urine 1.010 1.005 - 1.030   pH 7.0 5.0 - 8.0   Glucose, UA NEGATIVE NEGATIVE mg/dL   Hgb urine dipstick LARGE (A) NEGATIVE   Bilirubin Urine NEGATIVE NEGATIVE   Ketones, ur NEGATIVE NEGATIVE mg/dL   Protein, ur NEGATIVE NEGATIVE mg/dL   Nitrite NEGATIVE NEGATIVE   Leukocytes,  UA NEGATIVE NEGATIVE   RBC / HPF >50 (H) 0 - 5 RBC/hpf   WBC, UA 0-5 0 - 5 WBC/hpf   Bacteria, UA RARE (A) NONE SEEN   Squamous Epithelial / LPF 0-5 0 - 5  Pregnancy, urine POC     Status: None   Collection Time: 10/25/17  6:39 PM  Result Value Ref Range   Preg Test, Ur NEGATIVE NEGATIVE  CBC     Status: None   Collection Time: 10/25/17  7:24 PM  Result Value Ref Range   WBC 7.1 4.0 - 10.5 K/uL   RBC 4.49 3.87 - 5.11 MIL/uL   Hemoglobin 13.5 12.0 - 15.0 g/dL   HCT 09.8 11.9 - 14.7 %   MCV 90.0 78.0 -  100.0 fL   MCH 30.1 26.0 - 34.0 pg   MCHC 33.4 30.0 - 36.0 g/dL   RDW 16.1 09.6 - 04.5 %   Platelets 285 150 - 400 K/uL  Wet prep, genital     Status: Abnormal   Collection Time: 10/25/17  7:38 PM  Result Value Ref Range   Yeast Wet Prep HPF POC NONE SEEN NONE SEEN   Trich, Wet Prep NONE SEEN NONE SEEN   Clue Cells Wet Prep HPF POC NONE SEEN NONE SEEN   WBC, Wet Prep HPF POC FEW (A) NONE SEEN   Sperm NONE SEEN     Assessment and Plan  Abnormal uterine bleeding (AUB) - Plan: Discharge patient - Information provided on AUB - Rx for Lysteda 1300 mg TID x 4 days - Advised to keep scheduled appt for sonohysterogram on 5/24 -Patient verbalized an understanding of the plan of care and agrees.   Raelyn Mora, MSN, CNM 10/25/2017, 6:56 PM

## 2017-10-26 LAB — GC/CHLAMYDIA PROBE AMP (~~LOC~~) NOT AT ARMC
CHLAMYDIA, DNA PROBE: NEGATIVE
Neisseria Gonorrhea: NEGATIVE

## 2018-01-30 ENCOUNTER — Other Ambulatory Visit: Payer: Self-pay | Admitting: Obstetrics and Gynecology

## 2018-01-30 DIAGNOSIS — N6310 Unspecified lump in the right breast, unspecified quadrant: Secondary | ICD-10-CM

## 2018-02-02 ENCOUNTER — Ambulatory Visit
Admission: RE | Admit: 2018-02-02 | Discharge: 2018-02-02 | Disposition: A | Discharge status: Home / Self Care | Source: Ambulatory Visit | Attending: Obstetrics and Gynecology | Admitting: Obstetrics and Gynecology

## 2018-02-02 ENCOUNTER — Other Ambulatory Visit: Payer: Self-pay | Admitting: Obstetrics and Gynecology

## 2018-02-02 DIAGNOSIS — N6320 Unspecified lump in the left breast, unspecified quadrant: Secondary | ICD-10-CM

## 2018-02-02 DIAGNOSIS — N6310 Unspecified lump in the right breast, unspecified quadrant: Secondary | ICD-10-CM

## 2018-03-02 ENCOUNTER — Ambulatory Visit: Payer: Self-pay | Admitting: General Surgery

## 2018-04-03 NOTE — H&P (Addendum)
Molly Rojas is an 32 y.o. female. s/p CS x3 last w/BTL now with heavy reg timed cycles that are interfering with daily life. 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 She has an umbilical hernia which will be fixed by general surgery at time of hysterectomy.   Pertinent Gynecological History: Menses: flow is excessive with use of 12 pads or tampons on heaviest days Bleeding: dysfunctional uterine bleeding Contraception: NuvaRing vaginal inserts DES exposure: denies Blood transfusions: none Sexually transmitted diseases: no past history Previous GYN Procedures: none  Last MMG BIRADS 3 - has fu in 6 mo Last pap: normal Date: 2019 OB History: G3, P3   Menstrual History: No LMP recorded.    Past Medical History:  Diagnosis Date  . Abnormal Pap smear   . Anemia    with pregnancy  . Anxiety   . GERD (gastroesophageal reflux disease)    with pregnancy   . Headache(784.0)   . HPV (human papilloma virus) infection   . No pertinent past medical history   . Scoliosis   . Scoliosis   . Umbilical hernia   . Vaginal Pap smear, abnormal     Past Surgical History:  Procedure Laterality Date  . BACK SURGERY    . CESAREAN SECTION    . CESAREAN SECTION  03/28/2011   Procedure: CESAREAN SECTION;  Surgeon: Juluis Mire;  Location: [facility];  Service: Gynecology;  Laterality: N/A;  . CESAREAN SECTION N/A 07/22/2015   Procedure: CESAREAN SECTION;  Surgeon: Zelphia Cairo, Rojas;  Location: [facility];  Service: Obstetrics;  Laterality: N/A;  Repeat edc 07/28/15 NKDA  . CESAREAN SECTION N/A 08/18/2016   Procedure: CESAREAN SECTION;  Surgeon: Richarda Overlie, Rojas;  Location: [facility] BIRTH[facility] SU[facility];  Service: Obstetrics;  Laterality: N/A;  . DENTAL SURGERY    . ENDOVENOUS ABLATION SAPHENOUS VEIN W/ LASER Right 06/05/2017   endovenous laser ablation R GSV and stab phlebectomy 10-20 incisions R leg by Josephina Gip Rojas   . NO PAST SURGERIES    . SPINAL FUSION  placed rods from T-11-L4   for scoliosis,  spinal fusion with screws and rods  . WISDOM TOOTH EXTRACTION      Family History  Problem Relation Age of Onset  . Diabetes Mother   . Thyroid disease Mother   . Hyperlipidemia Father   . Hypertension Father   . Diabetes Maternal Aunt   . Diabetes Maternal Uncle   . Diabetes Maternal Grandfather   . Hyperlipidemia Paternal Grandmother   . Heart disease Paternal Grandfather   . Hyperlipidemia Paternal Grandfather   . Hypertension Paternal Grandfather   . Mental illness Brother   . Anesthesia problems Neg Hx   . Hypotension Neg Hx   . Malignant hyperthermia Neg Hx   . Pseudochol deficiency Neg Hx     Social History:  reports that she has never smoked. She has never used smokeless tobacco. She reports that she drinks about 3.0 standard drinks of alcohol per week. She reports that she does not use drugs.  Allergies:  Allergies  Allergen Reactions  . Percocet [Oxycodone-Acetaminophen] Other (See Comments)    hallucinations    No medications prior to admission.    ROS  not currently breastfeeding. Physical Exam Gen: well appearing, NAD CV: Reg rate Pulm: NWOB Abd: soft, nondistended, nontender, no mass es GYN: uterus enlarged - ~10 week size with minimal descent, no adnexa ttp/CMT Ext: No edema b/l  No results found for this or any previous visit (from the  past 24 hour(s)).  No results found.  TVUS w/9x4x6cm uterus w/adenomyosis  Assessment/Plan: 32 yo G3P2, hx CSx3 with BTL presenting for scheduled surgical management. She has a h/o AUB not controlled with medications. [place] notable for enlarged uterus with adenomyosis. She was counseled regarding her options and after careful weighing of risks/benefits, she elected to have a hysterectomy. Given minimal descent of uterus (fixed in pelvis), last CS w/BTL c/b adhesive disease, and general surgery planning to fix abdominal hernia, an open hysterectomy is planned. We plan for bilateral salpingectomy and cystoscopy. Plan to  leave ovaries. However, pt may have endometriosis and if endometrioma found, plan cystectomy favored over oophorectomy given 32 yo. Risks discussed including infection, bleeding, damage to surrounding structures, need for additional procedures, postoperative DVT and prolapse in the future. All questions answered. Consent signed in office. 2g Ancef on call to OR. Gen surg to follow hysterectomy portion.  Plan tramadol for PO pain given hallucinations w/percocet. Meloxicam is to be d/c-ed 7 days prior to surgery s/s bleeding risk. Can restart PO.    Molly Rojas 04/03/2018, 9:54 AM   No updates to H&P. Proceed with above surgery.    Molly Rojas

## 2018-04-16 ENCOUNTER — Other Ambulatory Visit: Payer: Self-pay

## 2018-04-16 ENCOUNTER — Encounter (HOSPITAL_COMMUNITY)
Admission: RE | Admit: 2018-04-16 | Discharge: 2018-04-16 | Disposition: A | Discharge status: Home / Self Care | Source: Ambulatory Visit | Attending: Obstetrics and Gynecology | Admitting: Obstetrics and Gynecology

## 2018-04-16 ENCOUNTER — Encounter (HOSPITAL_COMMUNITY): Payer: Self-pay

## 2018-04-16 DIAGNOSIS — Z01812 Encounter for preprocedural laboratory examination: Secondary | ICD-10-CM

## 2018-04-16 HISTORY — DX: Peripheral vascular disease, unspecified: I73.9

## 2018-04-16 LAB — TYPE AND SCREEN
ABO/RH(D): O POS
Antibody Screen: NEGATIVE

## 2018-04-16 LAB — CBC
HCT: 44.5 % (ref 36.0–46.0)
HEMOGLOBIN: 14.9 g/dL (ref 12.0–15.0)
MCH: 30.5 pg (ref 26.0–34.0)
MCHC: 33.5 g/dL (ref 30.0–36.0)
MCV: 91 fL (ref 80.0–100.0)
NRBC: 0 % (ref 0.0–0.2)
Platelets: 283 10*3/uL (ref 150–400)
RBC: 4.89 MIL/uL (ref 3.87–5.11)
RDW: 12.8 % (ref 11.5–15.5)
WBC: 7 10*3/uL (ref 4.0–10.5)

## 2018-04-16 NOTE — Patient Instructions (Addendum)
Your procedure is scheduled on: Thursday  11/14 at 7:30 am  Enter through the Main Entrance of [facility] at 6 am  Pick up the phone at the desk and dial [phone].  Call this number if you have problems the morning of surgery: [phone].  Remember: Do NOT eat food: Do NOT drink clear liquids after Midnight on  Wednesday  Take these medicines the morning of surgery with a SIP OF WATER:none  Do NOT wear jewelry (body piercing), metal hair clips/bobby pins, make-up, or nail polish. Do NOT wear lotions, powders, or perfumes.  You may wear deoderant. Do NOT shave for 48 hours prior to surgery. Do NOT bring valuables to the hospital. Contacts, dentures, or bridgework may not be worn into surgery. Leave suitcase in car.  After surgery it may be brought to your room.    For patients admitted to the hospital, checkout time is 11:00 AM the day of discharge.

## 2018-04-18 ENCOUNTER — Encounter (HOSPITAL_COMMUNITY): Payer: Self-pay

## 2018-04-18 NOTE — Anesthesia Preprocedure Evaluation (Addendum)
Anesthesia Evaluation  Patient identified by MRN, date of birth, ID band Patient awake    Reviewed: Allergy & Precautions, H&P , NPO status , Patient's Chart, lab work & pertinent test results  Airway Mallampati: I  TM Distance: >3 FB Neck ROM: full    Dental  (+) Teeth Intact, Implants, Partial Upper,    Pulmonary neg pulmonary ROS,    Pulmonary exam normal        Cardiovascular + Peripheral Vascular Disease  Normal cardiovascular exam     Neuro/Psych  Headaches, Anxiety    GI/Hepatic Neg liver ROS, GERD  ,  Endo/Other  negative endocrine ROS  Renal/GU negative Renal ROS  Female GU complaint     Musculoskeletal   Abdominal (+) + obese,   Peds  Hematology  (+) Blood dyscrasia, anemia ,   Anesthesia Other Findings   Reproductive/Obstetrics negative OB ROS                           Anesthesia Physical  Anesthesia Plan  ASA: II  Anesthesia Plan: General   Post-op Pain Management:    Induction: Intravenous  PONV Risk Score and Plan: 2 and Ondansetron, Treatment may vary due to age or medical condition, Scopolamine patch - Pre-op and Dexamethasone  Airway Management Planned: LMA and Oral ETT  Additional Equipment:   Intra-op Plan:   Post-operative Plan: Extubation in OR  Informed Consent: I have reviewed the patients History and Physical, chart, labs and discussed the procedure including the risks, benefits and alternatives for the proposed anesthesia with the patient or authorized representative who has indicated his/her understanding and acceptance.     Plan Discussed with: CRNA, Surgeon and Anesthesiologist  Anesthesia Plan Comments: (  )       Anesthesia Quick Evaluation

## 2018-04-19 ENCOUNTER — Inpatient Hospital Stay (HOSPITAL_COMMUNITY): Admitting: Anesthesiology

## 2018-04-19 ENCOUNTER — Encounter (HOSPITAL_COMMUNITY)
Admission: AD | Disposition: A | Discharge status: Home / Self Care | Payer: Self-pay | Source: Home / Self Care | Attending: Obstetrics and Gynecology

## 2018-04-19 ENCOUNTER — Other Ambulatory Visit: Payer: Self-pay

## 2018-04-19 ENCOUNTER — Ambulatory Visit (HOSPITAL_BASED_OUTPATIENT_CLINIC_OR_DEPARTMENT_OTHER): Admit: 2018-04-19 | Admitting: Obstetrics and Gynecology

## 2018-04-19 ENCOUNTER — Encounter (HOSPITAL_BASED_OUTPATIENT_CLINIC_OR_DEPARTMENT_OTHER): Payer: Self-pay

## 2018-04-19 ENCOUNTER — Encounter (HOSPITAL_COMMUNITY): Payer: Self-pay

## 2018-04-19 ENCOUNTER — Inpatient Hospital Stay (HOSPITAL_COMMUNITY)
Admission: AD | Admit: 2018-04-19 | Discharge: 2018-04-21 | DRG: 743 | Disposition: A | Discharge status: Home / Self Care | Attending: Obstetrics and Gynecology | Admitting: Obstetrics and Gynecology

## 2018-04-19 DIAGNOSIS — K429 Umbilical hernia without obstruction or gangrene: Secondary | ICD-10-CM | POA: Diagnosis present

## 2018-04-19 DIAGNOSIS — N920 Excessive and frequent menstruation with regular cycle: Secondary | ICD-10-CM | POA: Diagnosis present

## 2018-04-19 DIAGNOSIS — R102 Pelvic and perineal pain: Secondary | ICD-10-CM | POA: Diagnosis present

## 2018-04-19 DIAGNOSIS — N800 Endometriosis of uterus: Secondary | ICD-10-CM | POA: Diagnosis present

## 2018-04-19 DIAGNOSIS — N939 Abnormal uterine and vaginal bleeding, unspecified: Secondary | ICD-10-CM | POA: Diagnosis present

## 2018-04-19 DIAGNOSIS — Z90710 Acquired absence of both cervix and uterus: Secondary | ICD-10-CM | POA: Diagnosis present

## 2018-04-19 HISTORY — PX: HYSTERECTOMY ABDOMINAL WITH SALPINGO-OOPHORECTOMY: SHX6792

## 2018-04-19 HISTORY — PX: CYSTOSCOPY: SHX5120

## 2018-04-19 HISTORY — PX: UMBILICAL HERNIA REPAIR: SHX196

## 2018-04-19 LAB — PREGNANCY, URINE: Preg Test, Ur: NEGATIVE

## 2018-04-19 SURGERY — HYSTERECTOMY, ABDOMINAL, WITH SALPINGO-OOPHORECTOMY
Anesthesia: General | Site: Bladder

## 2018-04-19 SURGERY — HYSTERECTOMY, ABDOMINAL
Anesthesia: Choice | Laterality: Bilateral

## 2018-04-19 MED ORDER — HYDROMORPHONE HCL 1 MG/ML IJ SOLN
INTRAMUSCULAR | Status: AC
  Filled 2018-04-19: qty 0.5

## 2018-04-19 MED ORDER — ROCURONIUM BROMIDE 100 MG/10ML IV SOLN
INTRAVENOUS | Status: AC
  Filled 2018-04-19: qty 1

## 2018-04-19 MED ORDER — PROPOFOL 10 MG/ML IV BOLUS
INTRAVENOUS | Status: AC
  Filled 2018-04-19: qty 20

## 2018-04-19 MED ORDER — BUPIVACAINE LIPOSOME 1.3 % IJ SUSP
20.0000 mL | Freq: Once | INTRAMUSCULAR | Status: DC
  Filled 2018-04-19: qty 20

## 2018-04-19 MED ORDER — FENTANYL CITRATE (PF) 100 MCG/2ML IJ SOLN
INTRAMUSCULAR | Status: AC
  Filled 2018-04-19: qty 2

## 2018-04-19 MED ORDER — CHLORHEXIDINE GLUCONATE CLOTH 2 % EX PADS
6.0000 | MEDICATED_PAD | Freq: Once | CUTANEOUS | Status: AC
  Administered 2018-04-19: 6 via TOPICAL

## 2018-04-19 MED ORDER — DEXAMETHASONE SODIUM PHOSPHATE 4 MG/ML IJ SOLN
INTRAMUSCULAR | Status: AC
  Filled 2018-04-19: qty 1

## 2018-04-19 MED ORDER — SUGAMMADEX SODIUM 200 MG/2ML IV SOLN
INTRAVENOUS | Status: DC | PRN
  Administered 2018-04-19: 200 mg via INTRAVENOUS

## 2018-04-19 MED ORDER — LACTATED RINGERS IV SOLN
INTRAVENOUS | Status: DC
  Administered 2018-04-19 (×3): via INTRAVENOUS

## 2018-04-19 MED ORDER — 0.9 % SODIUM CHLORIDE (POUR BTL) OPTIME
TOPICAL | Status: DC | PRN
  Administered 2018-04-19: 2000 mL

## 2018-04-19 MED ORDER — ONDANSETRON HCL 4 MG/2ML IJ SOLN
INTRAMUSCULAR | Status: AC
  Filled 2018-04-19: qty 2

## 2018-04-19 MED ORDER — KETOROLAC TROMETHAMINE 30 MG/ML IJ SOLN: 30.0000 mg | Freq: Four times a day (QID) | INTRAMUSCULAR | Status: AC | PRN

## 2018-04-19 MED ORDER — FENTANYL CITRATE (PF) 250 MCG/5ML IJ SOLN
INTRAMUSCULAR | Status: AC
  Filled 2018-04-19: qty 5

## 2018-04-19 MED ORDER — PANTOPRAZOLE SODIUM 40 MG PO TBEC
40.0000 mg | DELAYED_RELEASE_TABLET | Freq: Every day | ORAL | Status: DC
  Administered 2018-04-20 – 2018-04-21 (×2): 40 mg via ORAL
  Filled 2018-04-19 (×4): qty 1

## 2018-04-19 MED ORDER — SUGAMMADEX SODIUM 200 MG/2ML IV SOLN
INTRAVENOUS | Status: AC
  Filled 2018-04-19: qty 2

## 2018-04-19 MED ORDER — ACETAMINOPHEN 325 MG PO TABS: 325.0000 mg | ORAL_TABLET | ORAL | Status: DC | PRN

## 2018-04-19 MED ORDER — SODIUM CHLORIDE 0.9% FLUSH: 3.0000 mL | INTRAVENOUS | Status: DC | PRN

## 2018-04-19 MED ORDER — SCOPOLAMINE 1 MG/3DAYS TD PT72: 1.0000 | MEDICATED_PATCH | Freq: Once | TRANSDERMAL | Status: DC

## 2018-04-19 MED ORDER — BUPIVACAINE-EPINEPHRINE (PF) 0.5% -1:200000 IJ SOLN
INTRAMUSCULAR | Status: DC | PRN
  Administered 2018-04-19: 20 mL

## 2018-04-19 MED ORDER — KETOROLAC TROMETHAMINE 30 MG/ML IJ SOLN: 30.0000 mg | Freq: Four times a day (QID) | INTRAMUSCULAR | Status: DC

## 2018-04-19 MED ORDER — ONDANSETRON HCL 4 MG/2ML IJ SOLN
4.0000 mg | Freq: Three times a day (TID) | INTRAMUSCULAR | Status: DC | PRN
  Filled 2018-04-19: qty 2

## 2018-04-19 MED ORDER — DEXTROSE 5 % IV SOLN: 3.0000 g | INTRAVENOUS | Status: DC

## 2018-04-19 MED ORDER — EPHEDRINE SULFATE 50 MG/ML IJ SOLN
INTRAMUSCULAR | Status: DC | PRN
  Administered 2018-04-19 (×2): 5 mg via INTRAVENOUS

## 2018-04-19 MED ORDER — TRAMADOL HCL 50 MG PO TABS
50.0000 mg | ORAL_TABLET | Freq: Four times a day (QID) | ORAL | Status: DC | PRN
  Administered 2018-04-19 – 2018-04-20 (×2): 50 mg via ORAL
  Filled 2018-04-19 (×2): qty 1

## 2018-04-19 MED ORDER — SENNOSIDES-DOCUSATE SODIUM 8.6-50 MG PO TABS
1.0000 | ORAL_TABLET | Freq: Every evening | ORAL | Status: DC | PRN
  Filled 2018-04-19: qty 1

## 2018-04-19 MED ORDER — DEXAMETHASONE SODIUM PHOSPHATE 10 MG/ML IJ SOLN
INTRAMUSCULAR | Status: DC | PRN
  Administered 2018-04-19: 4 mg via INTRAVENOUS

## 2018-04-19 MED ORDER — LIDOCAINE HCL (CARDIAC) PF 100 MG/5ML IV SOSY
PREFILLED_SYRINGE | INTRAVENOUS | Status: DC | PRN
  Administered 2018-04-19: 100 mg via INTRAVENOUS

## 2018-04-19 MED ORDER — STERILE WATER FOR IRRIGATION IR SOLN
Status: DC | PRN
  Administered 2018-04-19: 1000 mL via INTRAVESICAL

## 2018-04-19 MED ORDER — ONDANSETRON HCL 4 MG/2ML IJ SOLN
INTRAMUSCULAR | Status: DC | PRN
  Administered 2018-04-19: 4 mg via INTRAVENOUS

## 2018-04-19 MED ORDER — ACETAMINOPHEN 500 MG PO TABS
ORAL_TABLET | ORAL | Status: AC
  Administered 2018-04-19: 1000 mg via ORAL
  Filled 2018-04-19: qty 2

## 2018-04-19 MED ORDER — KETOROLAC TROMETHAMINE 30 MG/ML IJ SOLN
INTRAMUSCULAR | Status: AC
  Filled 2018-04-19: qty 1

## 2018-04-19 MED ORDER — GABAPENTIN 300 MG PO CAPS
ORAL_CAPSULE | ORAL | Status: AC
  Administered 2018-04-19: 300 mg via ORAL
  Filled 2018-04-19: qty 1

## 2018-04-19 MED ORDER — MIDAZOLAM HCL 2 MG/2ML IJ SOLN
INTRAMUSCULAR | Status: AC
  Filled 2018-04-19: qty 2

## 2018-04-19 MED ORDER — DIPHENHYDRAMINE HCL 50 MG/ML IJ SOLN: 12.5000 mg | INTRAMUSCULAR | Status: DC | PRN

## 2018-04-19 MED ORDER — CEFAZOLIN SODIUM-DEXTROSE 2-4 GM/100ML-% IV SOLN
2.0000 g | INTRAVENOUS | Status: AC
  Administered 2018-04-19: 2 g via INTRAVENOUS

## 2018-04-19 MED ORDER — MEPERIDINE HCL 25 MG/ML IJ SOLN: 6.2500 mg | INTRAMUSCULAR | Status: DC | PRN

## 2018-04-19 MED ORDER — SIMETHICONE 80 MG PO CHEW
80.0000 mg | CHEWABLE_TABLET | Freq: Four times a day (QID) | ORAL | Status: DC | PRN
  Filled 2018-04-19: qty 1

## 2018-04-19 MED ORDER — DOCUSATE SODIUM 100 MG PO CAPS
100.0000 mg | ORAL_CAPSULE | Freq: Two times a day (BID) | ORAL | Status: DC
  Administered 2018-04-19 – 2018-04-21 (×4): 100 mg via ORAL
  Filled 2018-04-19 (×9): qty 1

## 2018-04-19 MED ORDER — SCOPOLAMINE 1 MG/3DAYS TD PT72
1.0000 | MEDICATED_PATCH | Freq: Once | TRANSDERMAL | Status: DC
  Administered 2018-04-19: 1.5 mg via TRANSDERMAL

## 2018-04-19 MED ORDER — NALOXONE HCL 0.4 MG/ML IJ SOLN: 0.4000 mg | INTRAMUSCULAR | Status: DC | PRN

## 2018-04-19 MED ORDER — DIPHENHYDRAMINE HCL 25 MG PO CAPS
25.0000 mg | ORAL_CAPSULE | ORAL | Status: DC | PRN
  Filled 2018-04-19: qty 1

## 2018-04-19 MED ORDER — OXYCODONE HCL 5 MG/5ML PO SOLN: 5.0000 mg | Freq: Once | ORAL | Status: DC | PRN

## 2018-04-19 MED ORDER — MIDAZOLAM HCL 2 MG/2ML IJ SOLN
INTRAMUSCULAR | Status: DC | PRN
  Administered 2018-04-19: 2 mg via INTRAVENOUS

## 2018-04-19 MED ORDER — FENTANYL CITRATE (PF) 100 MCG/2ML IJ SOLN
INTRAMUSCULAR | Status: DC | PRN
  Administered 2018-04-19 (×7): 50 ug via INTRAVENOUS

## 2018-04-19 MED ORDER — ROCURONIUM BROMIDE 100 MG/10ML IV SOLN
INTRAVENOUS | Status: DC | PRN
  Administered 2018-04-19: 10 mg via INTRAVENOUS
  Administered 2018-04-19: 50 mg via INTRAVENOUS

## 2018-04-19 MED ORDER — ACETAMINOPHEN 500 MG PO TABS
1000.0000 mg | ORAL_TABLET | ORAL | Status: AC
  Administered 2018-04-19: 1000 mg via ORAL

## 2018-04-19 MED ORDER — SCOPOLAMINE 1 MG/3DAYS TD PT72
MEDICATED_PATCH | TRANSDERMAL | Status: AC
  Administered 2018-04-19: 1.5 mg via TRANSDERMAL
  Filled 2018-04-19: qty 1

## 2018-04-19 MED ORDER — HYDROMORPHONE HCL 1 MG/ML IJ SOLN
0.2500 mg | INTRAMUSCULAR | Status: DC | PRN
  Administered 2018-04-19 (×3): 0.5 mg via INTRAVENOUS

## 2018-04-19 MED ORDER — PROPOFOL 10 MG/ML IV BOLUS
INTRAVENOUS | Status: DC | PRN
  Administered 2018-04-19: 200 mg via INTRAVENOUS

## 2018-04-19 MED ORDER — GLYCOPYRROLATE 0.2 MG/ML IJ SOLN
INTRAMUSCULAR | Status: AC
  Filled 2018-04-19: qty 3

## 2018-04-19 MED ORDER — NALBUPHINE HCL 10 MG/ML IJ SOLN: 5.0000 mg | INTRAMUSCULAR | Status: DC | PRN

## 2018-04-19 MED ORDER — BUPIVACAINE-EPINEPHRINE (PF) 0.5% -1:200000 IJ SOLN
INTRAMUSCULAR | Status: AC
  Filled 2018-04-19: qty 30

## 2018-04-19 MED ORDER — ONDANSETRON HCL 4 MG PO TABS: 4.0000 mg | ORAL_TABLET | Freq: Four times a day (QID) | ORAL | Status: DC | PRN

## 2018-04-19 MED ORDER — NALOXONE HCL 4 MG/10ML IJ SOLN
1.0000 ug/kg/h | INTRAVENOUS | Status: DC | PRN
  Filled 2018-04-19: qty 5

## 2018-04-19 MED ORDER — ONDANSETRON HCL 4 MG/2ML IJ SOLN: 4.0000 mg | Freq: Once | INTRAMUSCULAR | Status: DC | PRN

## 2018-04-19 MED ORDER — KETOROLAC TROMETHAMINE 30 MG/ML IJ SOLN
INTRAMUSCULAR | Status: DC | PRN
  Administered 2018-04-19: 30 mg via INTRAVENOUS

## 2018-04-19 MED ORDER — GABAPENTIN 300 MG PO CAPS
300.0000 mg | ORAL_CAPSULE | ORAL | Status: AC
  Administered 2018-04-19: 300 mg via ORAL

## 2018-04-19 MED ORDER — LIDOCAINE HCL (CARDIAC) PF 100 MG/5ML IV SOSY
PREFILLED_SYRINGE | INTRAVENOUS | Status: AC
  Filled 2018-04-19: qty 5

## 2018-04-19 MED ORDER — PHENYLEPHRINE HCL 10 MG/ML IJ SOLN
INTRAMUSCULAR | Status: DC | PRN
  Administered 2018-04-19: 80 ug via INTRAVENOUS

## 2018-04-19 MED ORDER — BUTORPHANOL TARTRATE 1 MG/ML IJ SOLN
1.0000 mg | INTRAMUSCULAR | Status: DC | PRN
  Administered 2018-04-19 (×2): 2 mg via INTRAVENOUS
  Administered 2018-04-19: 1 mg via INTRAVENOUS
  Filled 2018-04-19 (×2): qty 2
  Filled 2018-04-19: qty 1

## 2018-04-19 MED ORDER — PHENYLEPHRINE 40 MCG/ML (10ML) SYRINGE FOR IV PUSH (FOR BLOOD PRESSURE SUPPORT)
PREFILLED_SYRINGE | INTRAVENOUS | Status: AC
  Filled 2018-04-19: qty 10

## 2018-04-19 MED ORDER — BUPIVACAINE HCL (PF) 0.25 % IJ SOLN
INTRAMUSCULAR | Status: AC
  Filled 2018-04-19: qty 30

## 2018-04-19 MED ORDER — FENTANYL CITRATE (PF) 100 MCG/2ML IJ SOLN
25.0000 ug | INTRAMUSCULAR | Status: DC | PRN
  Administered 2018-04-19 (×3): 50 ug via INTRAVENOUS

## 2018-04-19 MED ORDER — ACETAMINOPHEN 160 MG/5ML PO SOLN: 325.0000 mg | ORAL | Status: DC | PRN

## 2018-04-19 MED ORDER — ONDANSETRON HCL 4 MG/2ML IJ SOLN
4.0000 mg | Freq: Four times a day (QID) | INTRAMUSCULAR | Status: DC | PRN
  Administered 2018-04-19 – 2018-04-20 (×2): 4 mg via INTRAVENOUS
  Filled 2018-04-19: qty 2

## 2018-04-19 MED ORDER — NALBUPHINE HCL 10 MG/ML IJ SOLN: 5.0000 mg | Freq: Once | INTRAMUSCULAR | Status: DC | PRN

## 2018-04-19 MED ORDER — KETOROLAC TROMETHAMINE 30 MG/ML IJ SOLN
30.0000 mg | Freq: Four times a day (QID) | INTRAMUSCULAR | Status: DC
  Administered 2018-04-19 – 2018-04-21 (×6): 30 mg via INTRAVENOUS
  Filled 2018-04-19 (×7): qty 1

## 2018-04-19 MED ORDER — DEXTROSE-NACL 5-0.45 % IV SOLN
125.0000 mL/h | INTRAVENOUS | Status: AC
  Administered 2018-04-19 – 2018-04-20 (×3): 125 mL/h via INTRAVENOUS

## 2018-04-19 MED ORDER — CEFAZOLIN SODIUM-DEXTROSE 2-4 GM/100ML-% IV SOLN
INTRAVENOUS | Status: AC
  Filled 2018-04-19: qty 100

## 2018-04-19 MED ORDER — ALUM & MAG HYDROXIDE-SIMETH 200-200-20 MG/5ML PO SUSP
30.0000 mL | ORAL | Status: DC | PRN
  Filled 2018-04-19: qty 30

## 2018-04-19 MED ORDER — KETOROLAC TROMETHAMINE 30 MG/ML IJ SOLN
30.0000 mg | Freq: Four times a day (QID) | INTRAMUSCULAR | Status: AC | PRN
  Administered 2018-04-19: 30 mg via INTRAVENOUS

## 2018-04-19 MED ORDER — OXYCODONE HCL 5 MG PO TABS: 5.0000 mg | ORAL_TABLET | Freq: Once | ORAL | Status: DC | PRN

## 2018-04-19 MED ORDER — EPHEDRINE 5 MG/ML INJ
INTRAVENOUS | Status: AC
  Filled 2018-04-19: qty 10

## 2018-04-19 MED ORDER — ACETAMINOPHEN 500 MG PO TABS
1000.0000 mg | ORAL_TABLET | Freq: Four times a day (QID) | ORAL | Status: DC
  Administered 2018-04-19 – 2018-04-20 (×3): 1000 mg via ORAL
  Filled 2018-04-19 (×3): qty 2

## 2018-04-19 SURGICAL SUPPLY — 69 items
BENZOIN TINCTURE PRP APPL 2/3 (GAUZE/BANDAGES/DRESSINGS) ×10 IMPLANT
BLADE SURG 15 STRL LF C SS BP (BLADE) ×3 IMPLANT
BLADE SURG 15 STRL SS (BLADE) ×2
CANISTER SUCT 3000ML PPV (MISCELLANEOUS) ×5 IMPLANT
CLOSURE WOUND 1/2 X4 (GAUZE/BANDAGES/DRESSINGS) ×2
COVER LIGHT HANDLE  1/PK (MISCELLANEOUS) ×6
COVER LIGHT HANDLE 1/PK (MISCELLANEOUS) ×9 IMPLANT
COVER WAND RF STERILE (DRAPES) IMPLANT
DECANTER SPIKE VIAL GLASS SM (MISCELLANEOUS) ×5 IMPLANT
DERMABOND ADVANCED (GAUZE/BANDAGES/DRESSINGS) ×2
DERMABOND ADVANCED .7 DNX12 (GAUZE/BANDAGES/DRESSINGS) ×3 IMPLANT
DRAIN CHANNEL 19F RND (DRAIN) IMPLANT
DRAPE LAPAROTOMY T 98X78 PEDS (DRAPES) IMPLANT
DRAPE WARM FLUID 44X44 (DRAPE) IMPLANT
DRSG OPSITE 4X5.5 SM (GAUZE/BANDAGES/DRESSINGS) ×5 IMPLANT
DRSG OPSITE POSTOP 4X10 (GAUZE/BANDAGES/DRESSINGS) ×5 IMPLANT
DURAPREP 26ML APPLICATOR (WOUND CARE) ×5 IMPLANT
ELECT CAUTERY BLADE 6.4 (BLADE) ×5 IMPLANT
EVACUATOR SILICONE 100CC (DRAIN) IMPLANT
GAUZE 4X4 16PLY RFD (DISPOSABLE) IMPLANT
GAUZE SPONGE 4X4 12PLY STRL (GAUZE/BANDAGES/DRESSINGS) IMPLANT
GLOVE BIO SURGEON STRL SZ 6.5 (GLOVE) ×4 IMPLANT
GLOVE BIO SURGEON STRL SZ7 (GLOVE) ×5 IMPLANT
GLOVE BIO SURGEON STRL SZ7.5 (GLOVE) ×5 IMPLANT
GLOVE BIO SURGEONS STRL SZ 6.5 (GLOVE) ×1
GLOVE BIOGEL PI IND STRL 6.5 (GLOVE) ×3 IMPLANT
GLOVE BIOGEL PI IND STRL 7.0 (GLOVE) ×6 IMPLANT
GLOVE BIOGEL PI INDICATOR 6.5 (GLOVE) ×2
GLOVE BIOGEL PI INDICATOR 7.0 (GLOVE) ×4
GLOVE INDICATOR 8.0 STRL GRN (GLOVE) ×5 IMPLANT
GOWN STRL REUS W/TWL LRG LVL3 (GOWN DISPOSABLE) ×15 IMPLANT
GOWN STRL REUS W/TWL XL LVL3 (GOWN DISPOSABLE) ×5 IMPLANT
KIT BASIN OR (CUSTOM PROCEDURE TRAY) ×5 IMPLANT
LIGASURE IMPACT 36 18CM CVD LR (INSTRUMENTS) ×5 IMPLANT
NEEDLE HYPO 22GX1.5 SAFETY (NEEDLE) ×5 IMPLANT
NS IRRIG 1000ML POUR BTL (IV SOLUTION) ×10 IMPLANT
PACK ABDOMINAL GYN (CUSTOM PROCEDURE TRAY) ×10 IMPLANT
PAD OB MATERNITY 4.3X12.25 (PERSONAL CARE ITEMS) ×5 IMPLANT
RTRCTR C-SECT PINK 25CM LRG (MISCELLANEOUS) IMPLANT
SET CYSTO W/LG BORE CLAMP LF (SET/KITS/TRAYS/PACK) ×5 IMPLANT
SHEET LAVH (DRAPES) ×5 IMPLANT
SPONGE LAP 18X18 RF (DISPOSABLE) ×5 IMPLANT
STAPLER VISISTAT 35W (STAPLE) IMPLANT
STRIP CLOSURE SKIN 1/2X4 (GAUZE/BANDAGES/DRESSINGS) ×8 IMPLANT
SUT ETHILON 2 0 PS N (SUTURE) IMPLANT
SUT MNCRL AB 4-0 PS2 18 (SUTURE) ×5 IMPLANT
SUT NOVA 0 T19/GS 22DT (SUTURE) IMPLANT
SUT NOVA 1 T20/GS 25DT (SUTURE) IMPLANT
SUT NOVA NAB DX-16 0-1 5-0 T12 (SUTURE) ×5 IMPLANT
SUT PDS AB 1 TP1 96 (SUTURE) IMPLANT
SUT PLAIN 2 0 (SUTURE) ×4
SUT PLAIN ABS 2-0 CT1 27XMFL (SUTURE) ×6 IMPLANT
SUT PROLENE 0 CT 1 CR/8 (SUTURE) IMPLANT
SUT VIC AB 0 CT1 18XCR BRD8 (SUTURE) ×6 IMPLANT
SUT VIC AB 0 CT1 36 (SUTURE) ×15 IMPLANT
SUT VIC AB 0 CT1 8-18 (SUTURE) ×4
SUT VIC AB 2-0 SH 27 (SUTURE)
SUT VIC AB 2-0 SH 27XBRD (SUTURE) IMPLANT
SUT VIC AB 3-0 SH 18 (SUTURE) ×10 IMPLANT
SUT VIC AB 3-0 SH 27 (SUTURE)
SUT VIC AB 3-0 SH 27X BRD (SUTURE) IMPLANT
SUT VIC AB 4-0 PS2 18 (SUTURE) ×5 IMPLANT
SUT VICRYL 0 TIES 12 18 (SUTURE) ×5 IMPLANT
SUT VICRYL 4-0 PS2 18IN ABS (SUTURE) ×5 IMPLANT
SYR CONTROL 10ML LL (SYRINGE) ×5 IMPLANT
TOWEL OR 17X24 6PK STRL BLUE (TOWEL DISPOSABLE) ×15 IMPLANT
TRAY FOLEY BAG SILVER LF 16FR (CATHETERS) IMPLANT
TRAY FOLEY MTR SLVR 14FR STAT (SET/KITS/TRAYS/PACK) IMPLANT
TRAY FOLEY W/BAG SLVR 14FR (SET/KITS/TRAYS/PACK) ×5 IMPLANT

## 2018-04-19 NOTE — Brief Op Note (Signed)
04/19/2018  9:34 AM  PATIENT:  Molly Rojas  32 y.o. female  PRE-OPERATIVE DIAGNOSIS:  chronic pelvic pain, menorrhagia  POST-OPERATIVE DIAGNOSIS:  chronic pelvic pain, menorrhagia  PROCEDURE:  Procedure(s): HYSTERECTOMY ABDOMINAL WITH BILATERAL SALPINGECTOMY (Bilateral) CYSTOSCOPY (N/A) OPEN UMBILICAL HERNIA REPAIR (N/A)  SURGEON:  Surgeon(s) and Role: Panel 1:    * Atif Chapple, Madelaine EtienneElise Jennifer, MD - Primary Panel 2:    * Gaynelle AduWilson, Eric, MD - Primary  PHYSICIAN ASSISTANT:   ASSISTANTS: Dr. Candice Campavid Lowe   ANESTHESIA:   general  EBL:  200 mL   BLOOD ADMINISTERED:none  DRAINS: none and Urinary Catheter (Foley)   LOCAL MEDICATIONS USED:  NONE  SPECIMEN:  Source of Specimen:  uterus with cervix and bilateral fallopian tubes  DISPOSITION OF SPECIMEN:  PATHOLOGY  COUNTS:  YES  TOURNIQUET:  * No tourniquets in log *  DICTATION: .Note written in EPIC  PLAN OF CARE: Admit to inpatient   PATIENT DISPOSITION:  PACU - hemodynamically stable.   Delay start of Pharmacological VTE agent (>24hrs) due to surgical blood loss or risk of bleeding: not applicable

## 2018-04-19 NOTE — Transfer of Care (Signed)
Immediate Anesthesia Transfer of Care Note  Patient: Molly Rojas  Procedure(s) Performed: HYSTERECTOMY ABDOMINAL WITH BILATERAL SALPINGECTOMY (Bilateral Abdomen) CYSTOSCOPY (N/A Bladder) OPEN UMBILICAL HERNIA REPAIR (N/A Abdomen)  Patient Location: PACU  Anesthesia Type:General  Level of Consciousness: awake, alert  and oriented  Airway & Oxygen Therapy: Patient Spontanous Breathing and Patient connected to nasal cannula oxygen  Post-op Assessment: Report given to RN, Post -op Vital signs reviewed and stable and Patient moving all extremities  Post vital signs: Reviewed and stable  Last Vitals:  Vitals Value Taken Time  BP 139/75 04/19/2018  9:51 AM  Temp    Pulse 111 04/19/2018  9:56 AM  Resp 13 04/19/2018  9:56 AM  SpO2 100 % 04/19/2018  9:56 AM  Vitals shown include unvalidated device data.  Last Pain:  Vitals:   04/19/18 0634  TempSrc: Oral  PainSc: 0-No pain      Patients Stated Pain Goal: 3 (04/19/18 [id number])  Complications: No apparent anesthesia complications

## 2018-04-19 NOTE — Anesthesia Procedure Notes (Signed)
Procedure Name: Intubation Date/Time: 04/19/2018 7:34 AM Performed by: Hewitt Blade, CRNA Pre-anesthesia Checklist: Patient identified, Emergency Drugs available, Suction available and Patient being monitored Patient Re-evaluated:Patient Re-evaluated prior to induction Oxygen Delivery Method: Circle system utilized Preoxygenation: Pre-oxygenation with 100% oxygen Induction Type: IV induction Ventilation: Mask ventilation without difficulty Laryngoscope Size: Mac and 3 Grade View: Grade I Tube type: Oral Tube size: 7.0 mm Number of attempts: 1 Airway Equipment and Method: Stylet Placement Confirmation: ETT inserted through vocal cords under direct vision,  positive ETCO2 and breath sounds checked- equal and bilateral Secured at: 21 cm Tube secured with: Tape Dental Injury: Teeth and Oropharynx as per pre-operative assessment

## 2018-04-19 NOTE — Progress Notes (Signed)
Day of Surgery Procedure(s) (LRB): HYSTERECTOMY ABDOMINAL WITH BILATERAL SALPINGECTOMY (Bilateral) CYSTOSCOPY (N/A) OPEN UMBILICAL HERNIA REPAIR (N/A)  Subjective: Patient reports tolerating PO.    Objective: I have reviewed patient's vital signs, intake and output and medications.  General: alert, cooperative and appears stated age Resp: NWOB Cardio: reg rate GI: soft, non-tender; bowel sounds normal; no masses,  no organomegaly Extremities: extremities normal, atraumatic, no cyanosis or edema Vaginal Bleeding: none  Incisions c/d/i - no strike-through  Assessment: s/p Procedure(s): HYSTERECTOMY ABDOMINAL WITH BILATERAL SALPINGECTOMY (Bilateral) CYSTOSCOPY (N/A) OPEN UMBILICAL HERNIA REPAIR (N/A): stable and progressing well  Plan: Advance diet Encourage ambulation Advance to PO medication  LOS: 0 days    Ranae Pilalise Jennifer Maxwell Martorano 04/19/2018, 2:11 PM

## 2018-04-19 NOTE — Op Note (Signed)
04/19/2018  9:29 AM  PATIENT:  Molly Rojas  32 y.o. female  PRE-OPERATIVE DIAGNOSIS:   umbilical hernia  POST-OPERATIVE DIAGNOSIS:  Umbilical hernia  PROCEDURE:  Procedure(s):  OPEN UMBILICAL HERNIA REPAIR  SURGEON:  Surgeon(s): Gaynelle AduWilson, Garner Dullea, MD  ASSISTANTS: none   ANESTHESIA:   general  DRAINS: none   LOCAL MEDICATIONS USED:  MARCAINE     SPECIMEN:  Source of Specimen:  herniated fat  DISPOSITION OF SPECIMEN:  discarded  COUNTS:  YES  INDICATION FOR PROCEDURE: Patient is a 32 year old female who presented to my clinic with complaints of umbilical hernia.  She desired surgical care.  Please see my outside notes for further documentation.  Patient was also having chronic pelvic pain and menorrhagia and she was planning to undergo a hysterectomy.  She desired both her hysterectomy and umbilical hernia repair under the same anesthetic.  The case was coordinated.  PROCEDURE: After obtaining informed consent the patient was taken to the [facility] for at [facility].  General endotracheal anesthesia was established.  Sequential compression devices were placed.  Antibiotic was administered.  Surgical timeout was performed.  She was placed in lithotomy position.  Dr. Elon SpannerLeger did her portion of the procedure.  Once she was done with the open hysterectomy and cystoscopy I scrubbed in and did a surgical timeout for the umbilical hernia repair.  A curvilinear infraumbilical incision was created. Dissection was carried down to the hernia sac located above the fascia and was mobilized from surrounding structures. Intact fascia was identified circumferentially around the defect. She had a herniated piece of fat that was not reducible so I removed it with cautery.  This revealed the fascial defect. Skin and soft tissue was mobilized from the surface of the fascia in a circumferential manner. The back end of a pickup was swept underneath the fascia to ensure there were no adhesions to the anterior  abdominal wall.  The patient desired no mesh.  Her fascial defect was around 1 cm.  The fascia was then closed transversely with 5 interrupted 1-0-novafil sutures. The cavity was irrigated and additional local was infiltrated in the subcutaneous tissue and- fascia. The umbilical stalk was then tacked back down to the fascia with two 3-0 vicryl sutures. Hemostasis was confirmed. The soft tissue was irrigated and closed in layers with inverted interrupted 3-0 vicryl sutures for the deep dermis. The skin incision was closed with a 4-0 monocryl subcuticular closure. Steri-Strips followed by a 4x4 gauze and a tegaderm were applied at the end of the operation. All counts correct x 2.    PLAN OF CARE: Admit for overnight observation  PATIENT DISPOSITION:  PACU - hemodynamically stable.   Delay start of Pharmacological VTE agent (>24hrs) due to surgical blood loss or risk of bleeding:  no  Mary SellaEric M. Andrey CampanileWilson, MD, FACS General, Bariatric, & Minimally Invasive Surgery [facility] [place]

## 2018-04-19 NOTE — Op Note (Signed)
PREOPERATIVE DIAGNOSES: 1. Adenomyosis 2. Abnormal uterine bleeding. 3. Pelvic pain 4. Adhesive disease  POSTOPERATIVE DIAGNOSES: Same  PROCEDURE PERFORMED: Total abdominal hysterectomy with bilateral salpingectomy and cystoscopy Umbilical hernia repair per Dr. Andrey Campanile  SURGEON: Dr. Belva Agee ASSISTANT: Dr. Candice Camp  ANESTHESIA: General   ESTIMATED BLOOD LOSS: 200cc.  COMPLICATIONS: None   TUBES: None.  DRAINS: Foley to gravity.  PATHOLOGY: Uterus, cervix, and bilateral fallopian tubes were sent to pathology for review.  FINDINGS: On exam, under anesthesia, normal appearing vulva and vagina, a mildly enlarged uterus approximately 10 weeks size.  Operative findings demonstrated the above in addition to dense adhesive disease - especially btw bladder and LUS. Normal fallopian tubes and ovaries bilaterally. The bowel and omentum were normal appearing.  Procedure: The patient was prepped and draped in the usual sterile manner for an abdominal procedure. A pfannenstiel incision was made in prior CS incision and carried down to the underlying fascia. Fascia was incised in the midline and extended bilaterally with mayo scissors. Underlying rectus muscles were separated from the fascia superiorly and inferiorly in the usual fashion - dense adhesive disease removed sharply and carefully. Peritoneum was entered sharply with hemostat and metz with good visualization of the bladder and bowel. Once inside the abdominal cavity, a self-retaining retractor was placed. The uterus was then identified and grasped with upward traction. The round ligaments on either side were identified and individually dissected and ligated with ligasure and divided. This allowed [place] to then create a bladder flap by both blunt and sharp dissection. There was very dense adhesive disease and great care was taken to ensure no injuries.  The fallopian tube and ovarian ligament were isolated through the broad ligament from  the uterine body and ligated.  We then skeletonized the uterine vessels on either side and carefully dissected the bladder flap anteriorly. Posteriorly, the peritoneum was dissected down toward the uterosacral ligaments. Heaney clamps were then placed at each isthmic portion of the cervical body junction where the uterine arteries adjoined the uterus. These were claped, ligated and divided. The remainder of the uterus was then removed by the clamp-cut-ligation technique using #0 Vicryl on all major pedicles. With removal of the uterus, the vaginal cuff was closed with two haney stitches and then with serial figure of either stiches using #0 vicryl. Hemostasis was then inspected and secured throughout the entire area including the vaginal cuff, all pedicles, and the bladder. The lap sponges were then removed and the self-retaining retractor was removed. Hemostasis was achieved, however, old areas of scar were slightly denuded so arista was placed. The patient tolerated the operation nicely. There were no complications associated with this surgical procedure to this point. The sponge count was correct times 2 at this time. The Foley catheter was inspected and clear urine was noted. Having removed all instruments and packs, we then began closure of the abdomen.  The fascia was closed with #0 Vicryl in a running continuous manner and the subcutaneous tissue was also closed with #2-0 Vicryl in a running continuous manner. Hemostasis was secured throughout the entire layers. The incision was then closed as noted in the above operative findings.  A Foley catheter was then removed after noting clear urine. Cystoscopy was performed and a normal bladder survey with bilateral brisk ureteral jets were noted.   The sponge and lap counts were correct times 2 at this time.   The patient's procedure was terminated. We then awakened her. She was sent to the Recovery Room  in good condition.   Belva AgeeElise Jametta Moorehead MD

## 2018-04-19 NOTE — Discharge Instructions (Signed)
[facility]  UMBILICAL OR INGUINAL HERNIA REPAIR: POST OP INSTRUCTIONS  Always review your discharge instruction sheet given to you by the facility where your surgery was performed. IF YOU HAVE DISABILITY OR FAMILY LEAVE FORMS, YOU MUST BRING THEM TO THE OFFICE FOR PROCESSING.   DO NOT GIVE THEM TO YOUR DOCTOR.  1. See pain handout below 2. Take your usually prescribed medications unless otherwise directed. 3. If you need a refill on your pain medication, please contact your pharmacy.  They will contact our office to request authorization. Prescriptions will not be filled after 5 pm or on week-ends. 4. You should follow a light diet the first 24 hours after arrival home, such as soup and crackers, etc.  Be sure to include lots of fluids daily.  Resume your normal diet the day after surgery. 5. Most patients will experience some swelling and bruising around the umbilicus or in the groin and scrotum.  Ice packs and reclining will help.  Swelling and bruising can take several days to resolve.  6. It is common to experience some constipation if taking pain medication after surgery.  Increasing fluid intake and taking a stool softener (such as Colace) will usually help or prevent this problem from occurring.  A mild laxative (Milk of Magnesia or Miralax) should be taken according to package directions if there are no bowel movements after 48 hours. 7. Unless discharge instructions indicate otherwise, you may remove your bandages 24-48 hours after surgery, and you may shower at that time.  You may have steri-strips (small skin tapes) in place directly over the incision.  These strips should be left on the skin for 7-10 days.  If your surgeon used skin glue on the incision, you may shower in 24 hours.  The glue will flake off over the next 2-3 weeks.  Any sutures or staples will be removed at the office during your follow-up visit. 8. ACTIVITIES:  You may resume regular (light) daily  activities beginning the next day--such as daily self-care, walking, climbing stairs--gradually increasing activities as tolerated.  You may have sexual intercourse when it is comfortable.  Refrain from any heavy lifting or straining until approved by your doctor. a. You may drive when you are no longer taking prescription pain medication, you can comfortably wear a seatbelt, and you can safely maneuver your car and apply brakes. b. RETURN TO WORK:  9. You should see your doctor in the office for a follow-up appointment approximately 2-3 weeks after your surgery.  Make sure that you call for this appointment within a day or two after you arrive home to insure a convenient appointment time. 10. OTHER INSTRUCTIONS: DO NOT LIFT/PUSH/PULL ANYTHING GREATER THAN 15 LBS FOR 4 WEEKS    WHEN TO CALL YOUR DOCTOR: 1. Fever over 101.0 2. Inability to urinate 3. Nausea and/or vomiting 4. Extreme swelling or bruising 5. Continued bleeding from incision. 6. Increased pain, redness, or drainage from the incision  The clinic staff is available to answer your questions during regular business hours.  Please dont hesitate to call and ask to speak to one of the nurses for clinical concerns.  If you have a medical emergency, go to the nearest emergency room or call 911.  A surgeon from [facility] is always on call at the hospital   [address], [place], [place]  [address] ?  [address] [address], [place], [place]   [address] [phone] ? [phone] ? FAX [phone]  [phone] Web site: [email or web address]     Managing Your Pain After Surgery Without Opioids    Thank you for participating in our program to help patients manage their pain after surgery without opioids. This is part of our effort to provide you with the best care possible, without exposing you or your family to the risk that opioids pose.  What pain can I expect after surgery? You can expect to have  some pain after surgery. This is normal. The pain is typically worse the day after surgery, and quickly begins to get better. Many studies have found that many patients are able to manage their pain after surgery with Over-the-Counter (OTC) medications such as Tylenol and Motrin. If you have a condition that does not allow you to take Tylenol or Motrin, notify your surgical team.  How will I manage my pain? The best strategy for controlling your pain after surgery is around the clock pain control with Tylenol (acetaminophen) and Motrin (ibuprofen or Advil). Alternating these medications with each other allows you to maximize your pain control. In addition to Tylenol and Motrin, you can use heating pads or ice packs on your incisions to help reduce your pain.  How will I alternate your regular strength over-the-counter pain medication? You will take a dose of pain medication every three hours. ; Start by taking 650 mg of Tylenol (2 pills of 325 mg) ; 3 hours later take 600 mg of Motrin (3 pills of 200 mg) ; 3 hours after taking the Motrin take 650 mg of Tylenol ; 3 hours after that take 600 mg of Motrin.   - 1 -  See example - if your first dose of Tylenol is at 12:00 PM   12:00 PM Tylenol 650 mg (2 pills of 325 mg)  3:00 PM Motrin 600 mg (3 pills of 200 mg)  6:00 PM Tylenol 650 mg (2 pills of 325 mg)  9:00 PM Motrin 600 mg (3 pills of 200 mg)  Continue alternating every 3 hours   We recommend that you follow this schedule around-the-clock for at least 3 days after surgery, or until you feel that it is no longer needed. Use the table on the last page of this handout to keep track of the medications you are taking. Important: Do not take more than 3000mg  of Tylenol or 3200mg  of Motrin in a 24-hour period. Do not take ibuprofen/Motrin if you have a history of bleeding stomach ulcers, severe kidney disease, &/or actively taking a blood thinner  What if I still have pain? If you have pain  that is not controlled with the over-the-counter pain medications (Tylenol and Motrin or Advil) you might have what we call breakthrough pain. You will receive a prescription for a small amount of an opioid pain medication such as Oxycodone, Tramadol, or Tylenol with Codeine. Use these opioid pills in the first 24 hours after surgery if you have breakthrough pain. Do not take more than 1 pill every 4-6 hours.  If you still have uncontrolled pain after using all opioid pills, don't hesitate to call our staff using the number provided. We will help make sure you are managing your pain in the best way possible, and if necessary, we can provide a prescription for additional pain medication.   Day 1    Time  Name of Medication Number of pills taken  Amount of Acetaminophen  Pain Level   Comments  AM PM       AM PM  AM PM       AM PM       AM PM       AM PM       AM PM       AM PM       Total Daily amount of Acetaminophen Do not take more than  3,000 mg per day      Day 2    Time  Name of Medication Number of pills taken  Amount of Acetaminophen  Pain Level   Comments  AM PM       AM PM       AM PM       AM PM       AM PM       AM PM       AM PM       AM PM       Total Daily amount of Acetaminophen Do not take more than  3,000 mg per day      Day 3    Time  Name of Medication Number of pills taken  Amount of Acetaminophen  Pain Level   Comments  AM PM       AM PM       AM PM       AM PM          AM PM       AM PM       AM PM       AM PM       Total Daily amount of Acetaminophen Do not take more than  3,000 mg per day      Day 4    Time  Name of Medication Number of pills taken  Amount of Acetaminophen  Pain Level   Comments  AM PM       AM PM       AM PM       AM PM       AM PM       AM PM       AM PM       AM PM       Total Daily amount of Acetaminophen Do not take more than  3,000 mg per day      Day 5    Time  Name of  Medication Number of pills taken  Amount of Acetaminophen  Pain Level   Comments  AM PM       AM PM       AM PM       AM PM       AM PM       AM PM       AM PM       AM PM       Total Daily amount of Acetaminophen Do not take more than  3,000 mg per day       Day 6    Time  Name of Medication Number of pills taken  Amount of Acetaminophen  Pain Level  Comments  AM PM       AM PM       AM PM       AM PM       AM PM       AM PM       AM PM       AM PM       Total Daily amount  of Acetaminophen Do not take more than  3,000 mg per day      Day 7    Time  Name of Medication Number of pills taken  Amount of Acetaminophen  Pain Level   Comments  AM PM       AM PM       AM PM       AM PM       AM PM       AM PM       AM PM       AM PM       Total Daily amount of Acetaminophen Do not take more than  3,000 mg per day        For additional information about how and where to safely dispose of unused opioid medications - [employer]  Disclaimer: This document contains information and/or instructional materials adapted from [facility] for the typical patient with your condition. It does not replace medical advice from your health care provider because your experience may differ from that of the typical patient. Talk to your health care provider if you have any questions about this document, your condition or your treatment plan. Adapted from [place] [facility]

## 2018-04-19 NOTE — Anesthesia Postprocedure Evaluation (Signed)
Anesthesia Post Note  Patient: Tylene FantasiaBelinda Blaker  Procedure(s) Performed: HYSTERECTOMY ABDOMINAL WITH BILATERAL SALPINGECTOMY (Bilateral Abdomen) CYSTOSCOPY (N/A Bladder) OPEN UMBILICAL HERNIA REPAIR (N/A Abdomen)     Patient location during evaluation: PACU Anesthesia Type: General Level of consciousness: awake and alert Pain management: pain level controlled Vital Signs Assessment: post-procedure vital signs reviewed and stable Respiratory status: spontaneous breathing, nonlabored ventilation, respiratory function stable and patient connected to nasal cannula oxygen Cardiovascular status: blood pressure returned to baseline and stable Postop Assessment: no apparent nausea or vomiting Anesthetic complications: no    Last Vitals:  Vitals:   04/19/18 0634  BP: 108/82  Pulse: 79  Resp: 16  Temp: 36.8 C  SpO2: 98%    Last Pain:  Vitals:   04/19/18 0634  TempSrc: Oral  PainSc: 0-No pain   Pain Goal: Patients Stated Pain Goal: 3 (04/19/18 [id number])               Ferron Ishmael

## 2018-04-20 ENCOUNTER — Encounter (HOSPITAL_COMMUNITY): Payer: Self-pay | Admitting: Obstetrics and Gynecology

## 2018-04-20 LAB — CBC
HCT: 29.2 % — ABNORMAL LOW (ref 36.0–46.0)
Hemoglobin: 9.8 g/dL — ABNORMAL LOW (ref 12.0–15.0)
MCH: 30.5 pg (ref 26.0–34.0)
MCHC: 33.6 g/dL (ref 30.0–36.0)
MCV: 91 fL (ref 80.0–100.0)
PLATELETS: 214 10*3/uL (ref 150–400)
RBC: 3.21 MIL/uL — AB (ref 3.87–5.11)
RDW: 12.9 % (ref 11.5–15.5)
WBC: 6.9 10*3/uL (ref 4.0–10.5)
nRBC: 0 % (ref 0.0–0.2)

## 2018-04-20 MED ORDER — HYDROCODONE-ACETAMINOPHEN 5-325 MG PO TABS
1.0000 | ORAL_TABLET | ORAL | Status: DC | PRN
  Administered 2018-04-20 – 2018-04-21 (×3): 1 via ORAL
  Administered 2018-04-21: 2 via ORAL
  Filled 2018-04-20 (×3): qty 1
  Filled 2018-04-20 (×2): qty 2

## 2018-04-20 MED ORDER — ACETAMINOPHEN 325 MG PO TABS
650.0000 mg | ORAL_TABLET | Freq: Four times a day (QID) | ORAL | Status: DC
  Administered 2018-04-20: 650 mg via ORAL
  Filled 2018-04-20 (×2): qty 2

## 2018-04-20 NOTE — Progress Notes (Signed)
1 Day Post-Op Procedure(s) (LRB): HYSTERECTOMY ABDOMINAL WITH BILATERAL SALPINGECTOMY (Bilateral) CYSTOSCOPY (N/A) OPEN UMBILICAL HERNIA REPAIR (N/A)  Subjective: Patient reports tolerating PO.  And in minimal pain.   Objective: I have reviewed patient's vital signs, intake and output, medications and labs.  General: alert, cooperative and appears stated age Resp: NWOB Cardio: regular rate and rhythm GI: soft, non-tender; bowel sounds normal; no masses,  no organomegaly and incision: clean, intact and some strike-through, dressing to be changed Extremities: extremities normal, atraumatic, no cyanosis or edema, Homans sign is negative, no sign of DVT and no edema, redness or tenderness in the calves or thighs Vaginal Bleeding: none No CVA tenderness   Assessment: s/p Procedure(s): HYSTERECTOMY ABDOMINAL WITH BILATERAL SALPINGECTOMY (Bilateral) CYSTOSCOPY (N/A) OPEN UMBILICAL HERNIA REPAIR (N/A): stable, progressing well and tolerating diet  Plan: Encourage ambulation Discontinue IV fluids remove foley and await trial of void  LOS: 1 day    Ranae Pilalise Jennifer Jamina Macbeth 04/20/2018, 7:03 AM

## 2018-04-21 MED ORDER — DOCUSATE SODIUM 100 MG PO CAPS: 100.0000 mg | ORAL_CAPSULE | Freq: Two times a day (BID) | ORAL | 2 refills | Status: DC

## 2018-04-21 MED ORDER — HYDROCODONE-ACETAMINOPHEN 5-325 MG PO TABS: 1.0000 | ORAL_TABLET | ORAL | 0 refills | Status: DC | PRN

## 2018-04-21 MED ORDER — FERROUS SULFATE 325 (65 FE) MG PO TBEC: 325.0000 mg | DELAYED_RELEASE_TABLET | Freq: Two times a day (BID) | ORAL | 2 refills | Status: DC

## 2018-04-21 MED ORDER — TRAMADOL HCL 50 MG PO TABS: 50.0000 mg | ORAL_TABLET | Freq: Four times a day (QID) | ORAL | 0 refills | Status: DC | PRN

## 2018-04-21 NOTE — Progress Notes (Signed)
Discharge instructions and prescriptions given to pt. Discussed post-op care, signs and symptoms to report to the MD, upcoming appointments, and meds. Pt verbalizes understanding and has no questions or concerns at this time. Pt discharged from hospital in stable condition. 

## 2018-04-21 NOTE — Discharge Summary (Signed)
Physician Discharge Summary  Patient ID: Molly Rojas MRN: [id number] DOB/AGE: 08-09-1985 32 y.o.  Admit date: 04/19/2018 Discharge date: 04/21/2018  Admission Diagnoses:  Discharge Diagnoses:  Active Problems:   Abnormal uterine bleeding   S/P hysterectomy   Discharged Condition: good  Hospital Course: Pt is a 32yo admitted for scheduled above surgery. She Had an uncomplicated TAH BS LOA umb hernia repair and cystoscopy with an EBL of 200cc. She had an uncomplicated postoperative course and on POD#2 was meeting all goals such as ambulating, passing flatus, voiding freely, and tolerating a general diet and she was deemed stable for discharge home.    Consults: None  Significant Diagnostic Studies: labs:  CBC    Component Value Date/Time   WBC 6.9 04/20/2018 0507   RBC 3.21 (L) 04/20/2018 0507   HGB 9.8 (L) 04/20/2018 0507   HGB 13.5 07/05/2017 0844   HCT 29.2 (L) 04/20/2018 0507   HCT 40.4 07/05/2017 0844   PLT 214 04/20/2018 0507   PLT 295 07/05/2017 0844   MCV 91.0 04/20/2018 0507   MCV 86 07/05/2017 0844   MCH 30.5 04/20/2018 0507   MCHC 33.6 04/20/2018 0507   RDW 12.9 04/20/2018 0507   RDW 14.5 07/05/2017 0844   LYMPHSABS 2.0 07/05/2017 0844   MONOABS 0.2 07/08/2008 2101   EOSABS 0.2 07/05/2017 0844   BASOSABS 0.0 07/05/2017 0844     Treatments: surgery:  TAH BS LOA umb hernia repair and cystoscopy  Discharge Exam: Blood pressure 107/65, pulse 82, temperature 99 F (37.2 C), temperature source Oral, resp. rate 17, height 5' (1.524 m), weight 65.1 kg, SpO2 99 %, not currently breastfeeding. General appearance: alert, cooperative and appears stated age Resp: NWOB Cardio: regular rate and rhythm GI: soft, non-tender; bowel sounds normal; no masses,  no organomegaly and incision c/d/i Pelvic: R labia w/mild bruising, no hematoma. No vaginal bleeding Extremities: extremities normal, atraumatic, no cyanosis or edema and Homans sign is negative, no sign of  DVT  Disposition: Discharge disposition: 01-Home or Self Care       Discharge Instructions    Call MD for:   Complete by:  As directed    Heavy vaginal bleeding or abnormal vaginal discharge   Call MD for:  difficulty breathing, headache or visual disturbances   Complete by:  As directed    Call MD for:  persistant nausea and vomiting   Complete by:  As directed    Call MD for:  redness, tenderness, or signs of infection (pain, swelling, redness, odor or green/yellow discharge around incision site)   Complete by:  As directed    Call MD for:  severe uncontrolled pain   Complete by:  As directed    Call MD for:  temperature >100.4   Complete by:  As directed    Diet general   Complete by:  As directed    Driving Restrictions   Complete by:  As directed    Do not drive until you are off narcotic pain medications and you feel like you can react in an emergency.   Increase activity slowly   Complete by:  As directed    Leave dressing on - Keep it clean, dry, and intact until clinic visit   Complete by:  As directed    Lifting restrictions   Complete by:  As directed    Don't lift anything more than 15-20 pounds   Sexual Activity Restrictions   Complete by:  As directed    Nothing in the  vagina x 2 to 6 weeks. We will discuss at clinic visit.     Allergies as of 04/21/2018      Reactions   Percocet [oxycodone-acetaminophen] Other (See Comments)   hallucinations      Medication List    STOP taking these medications   NUVARING 0.12-0.015 MG/24HR vaginal ring Generic drug:  etonogestrel-ethinyl estradiol     TAKE these medications   acetaminophen 500 MG tablet Commonly known as:  TYLENOL Take 1,000 mg by mouth every 6 (six) hours as needed for mild pain.   docusate sodium 100 MG capsule Commonly known as:  COLACE Take 1 capsule (100 mg total) by mouth 2 (two) times daily.   ferrous sulfate 325 (65 FE) MG EC tablet Take 1 tablet (325 mg total) by mouth 2 (two)  times daily.   HYDROcodone-acetaminophen 5-325 MG tablet Commonly known as:  NORCO/VICODIN Take 1-2 tablets by mouth every 4 (four) hours as needed for moderate pain.   magnesium gluconate 500 MG tablet Commonly known as:  MAGONATE Take 1,000 mg by mouth daily.   meloxicam 15 MG tablet Commonly known as:  MOBIC Take 15 mg by mouth daily.   PROBIOTIC-10 Caps Take 2 capsules by mouth daily.   traMADol 50 MG tablet Commonly known as:  ULTRAM Take 1 tablet (50 mg total) by mouth every 6 (six) hours as needed for moderate pain.      Follow-up Information    Gaynelle AduWilson, Eric, MD. Schedule an appointment as soon as possible for a visit in 3 weeks.   Specialty:  General Surgery Why:  For wound re-check Contact information: [address] [place] [address] [phone]           Signed: Ranae Pilalise Jennifer Meaghan Whistler 04/21/2018, 7:41 AM

## 2018-08-06 ENCOUNTER — Other Ambulatory Visit: Payer: Self-pay

## 2019-01-04 IMAGING — MG DIGITAL DIAGNOSTIC BILATERAL MAMMOGRAM WITH TOMO AND CAD
6 of 10 series · 6 of 30 positions shown · non-contrast
Comparison: Previous exam(s).

CLINICAL DATA: Patient complains of a palpable abnormality in the
upper-outer quadrant of the right breast.

EXAM:
DIGITAL DIAGNOSTIC BILATERAL MAMMOGRAM WITH CAD AND TOMO
ULTRASOUND BILATERAL BREAST

[R CC synth-2D]
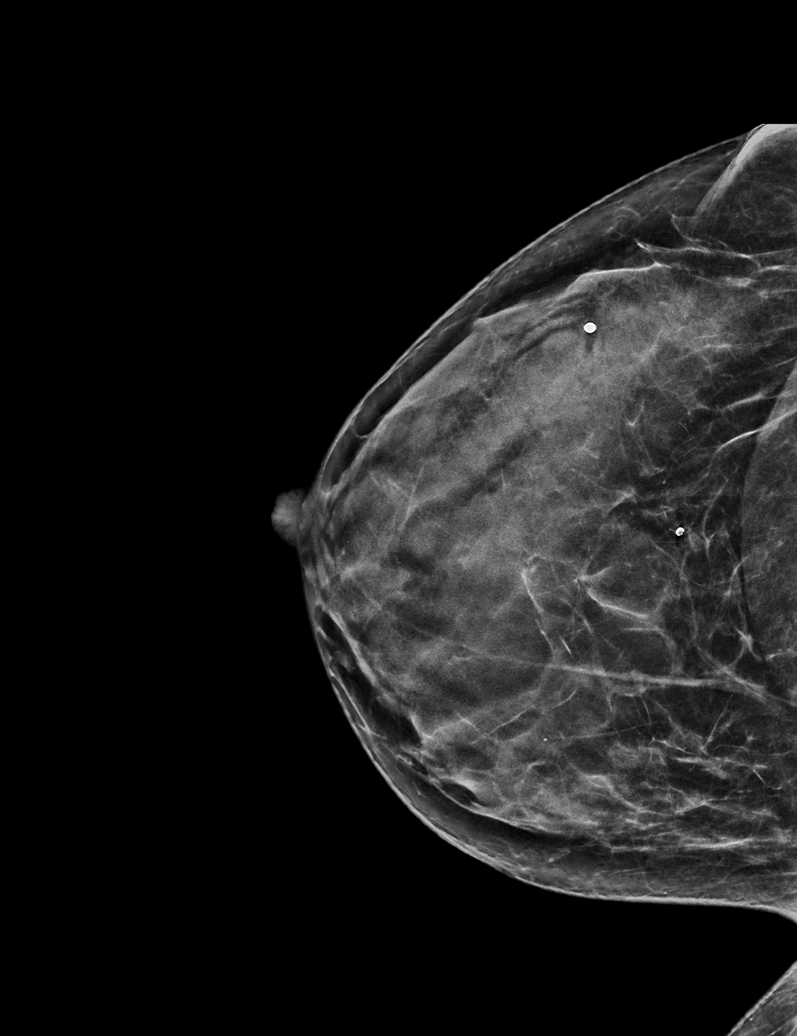

[R MLO synth-2D (1 of 2)]
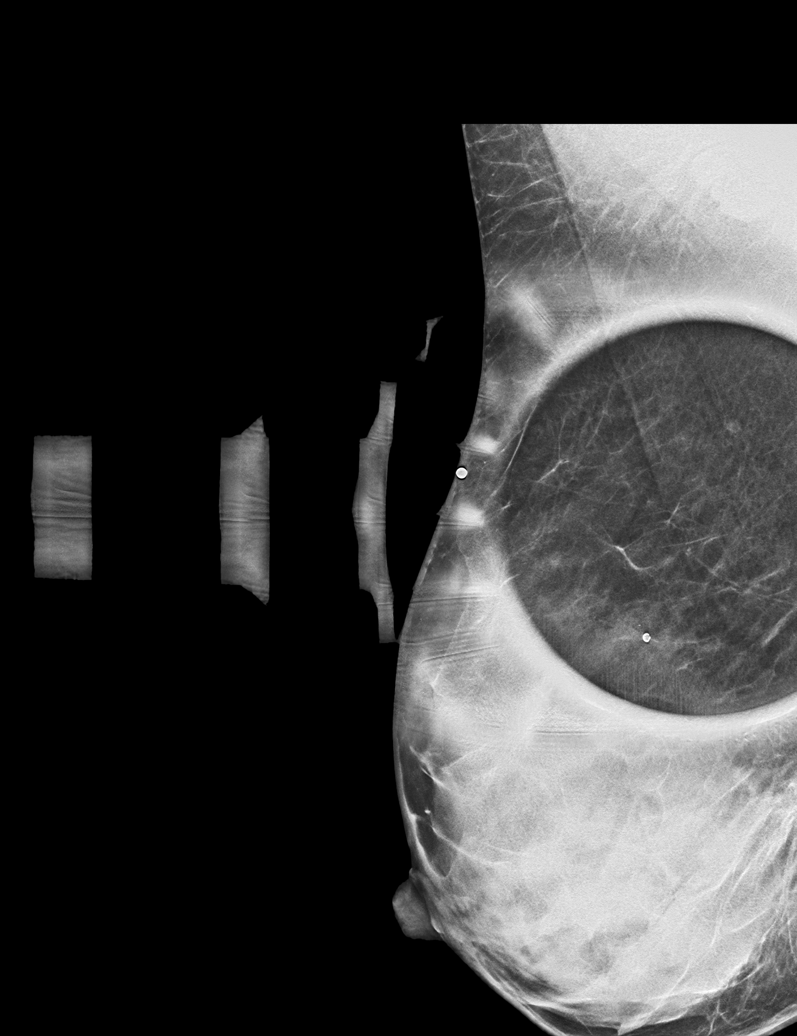

[R MLO synth-2D (2 of 2)]
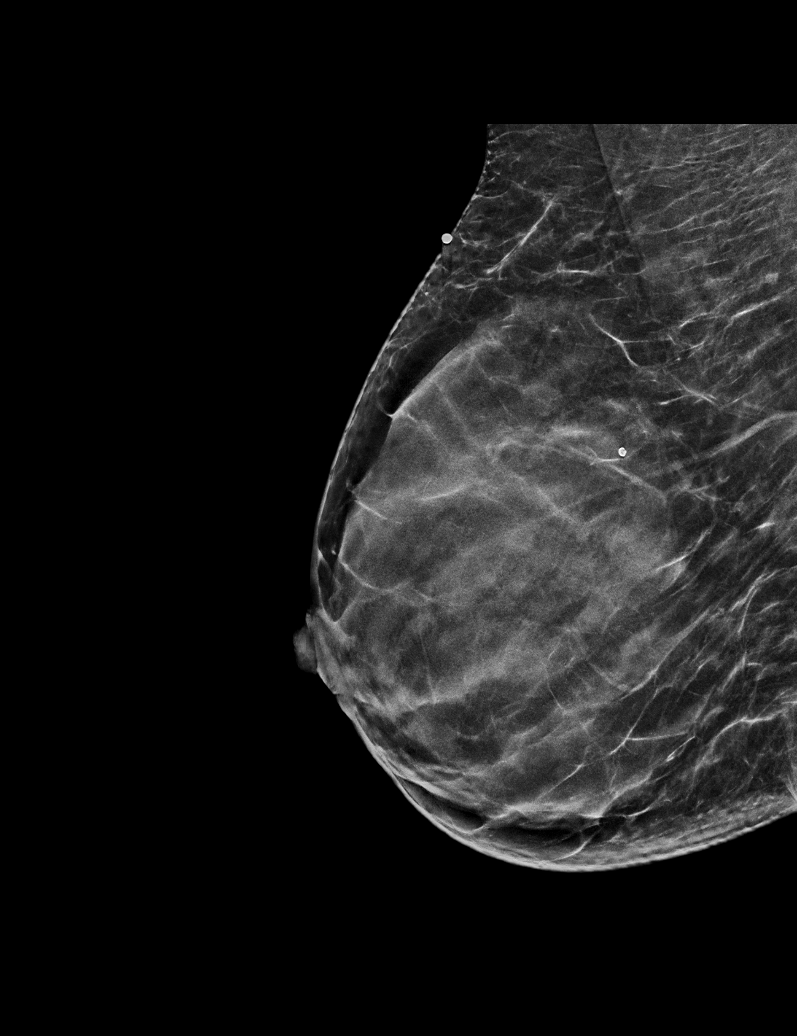

[L MLO synth-2D]
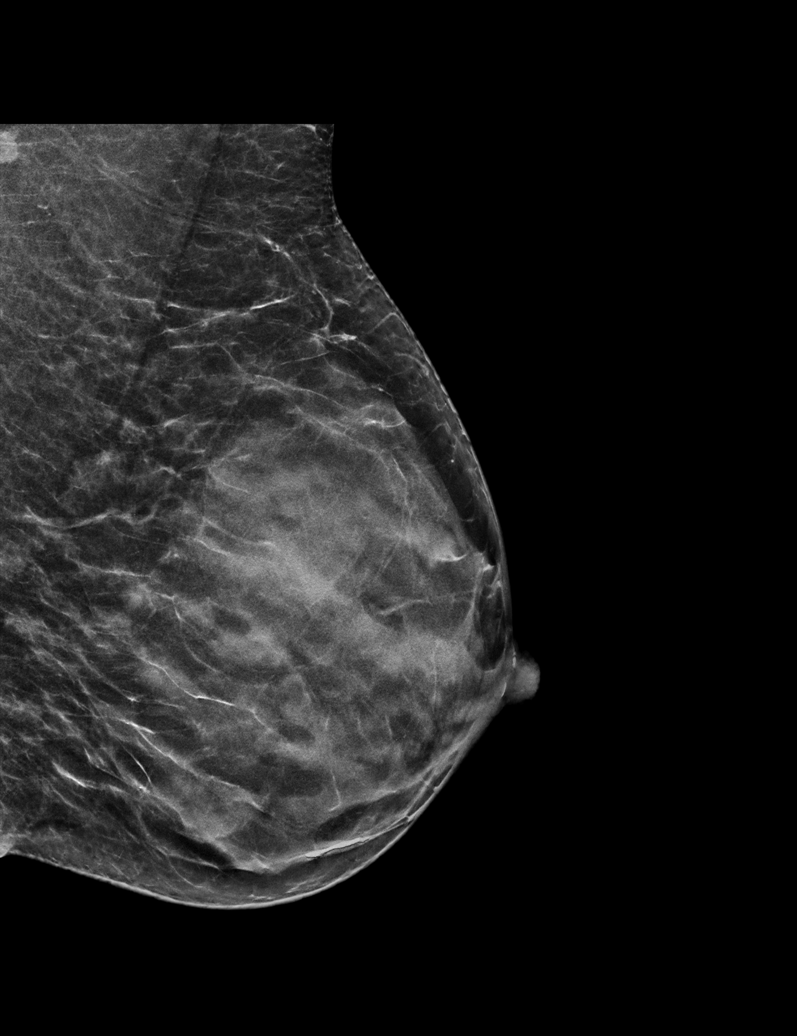

[L CC synth-2D]
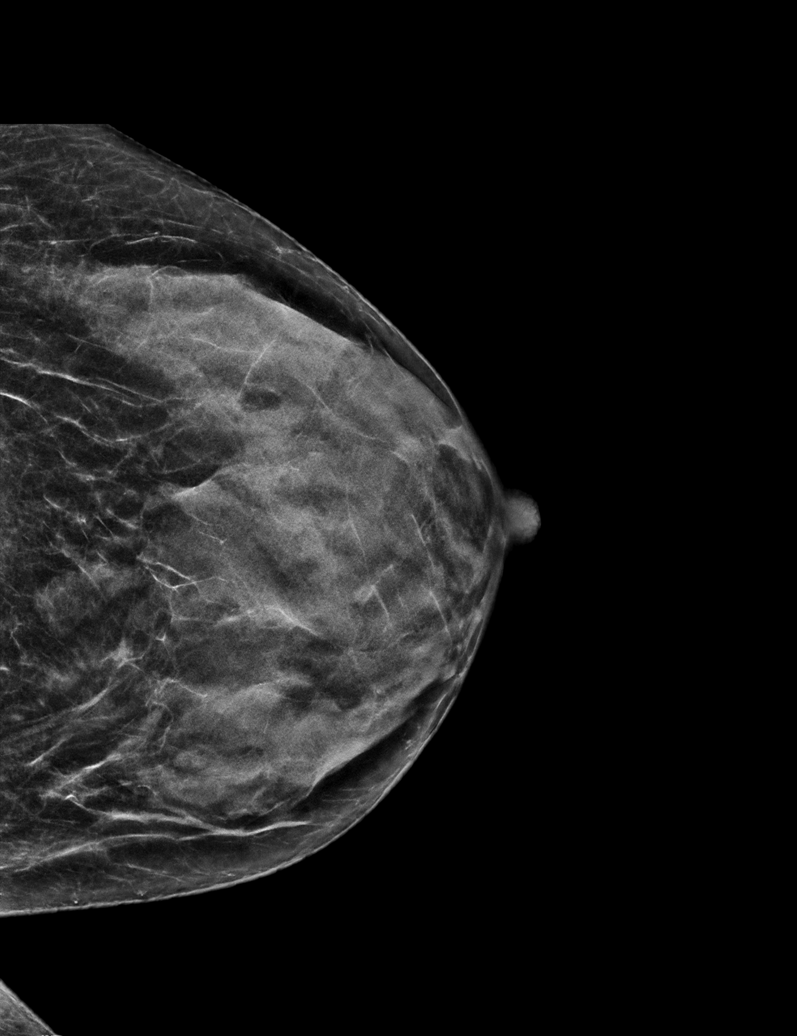

[L MLO tomo · tomo slice 26/51.0]
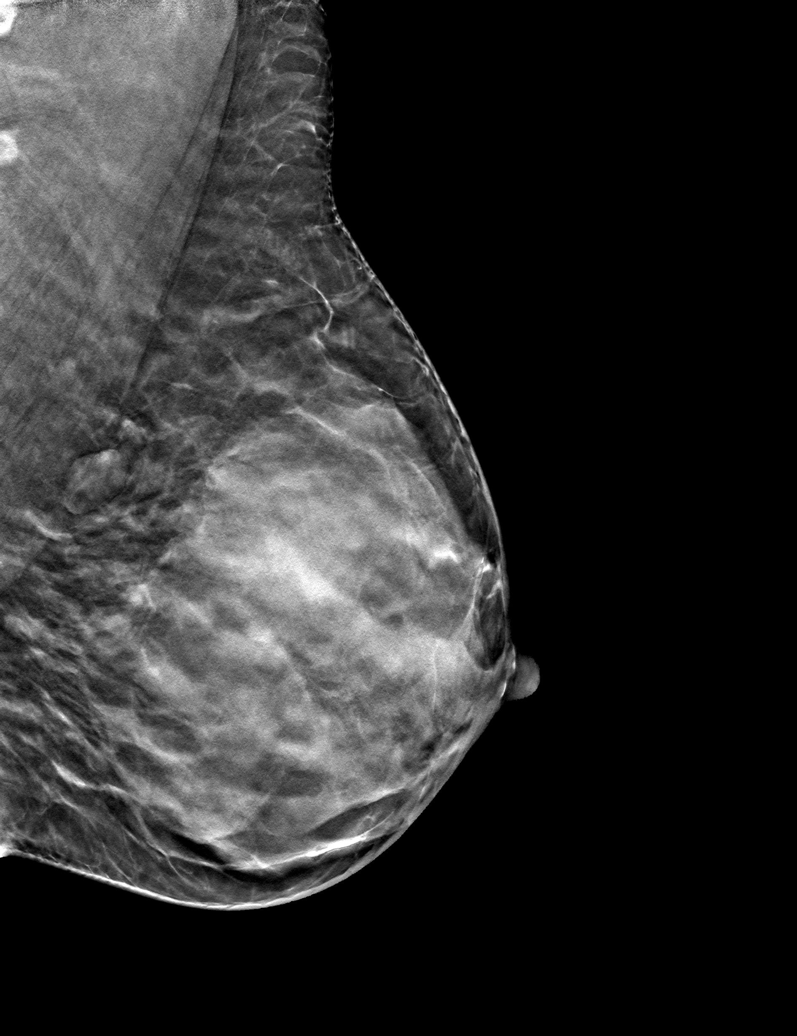

[6 of 30 positions shown; findings below may reference images not displayed]

ACR Breast Density Category d: The breast tissue is extremely dense,
which lowers the sensitivity of mammography.
FINDINGS: There is an ovoid obscured mass in the lateral aspect of the right
breast measuring 2.3 cm. There is a well-circumscribed 1.5 cm mass
in the upper-inner quadrant of the left breast. There are no
malignant type microcalcifications in either breast.

Mammographic images were processed with CAD.

On physical exam, I palpate a mass in the right breast at 9 o'clock
3 cm from the nipple. I do not palpate a mass in the left breast.

Targeted ultrasound is performed, showing normal tissue in the area
of clinical concern in the upper-outer quadrant of the right breast.
There is a well-circumscribed hypoechoic mass in the right breast at
9 o'clock 3 cm from the nipple measuring 1.4 x 0.8 x 2.2 cm. There
is a well-circumscribed hypoechoic mass in the left breast at [DATE]
7 cm from the nipple measuring 1.5 x 0.5 x 1.2 cm. These masses are
likely benign fibroadenomata.
IMPRESSION: Probable fibroadenomata in both breast.

RECOMMENDATION:
Short-term interval follow-up bilateral breast ultrasound in 6
months is recommended. Options of ultrasound-guided core biopsies
and surgical excision discussed with the patient.

I have discussed the findings and recommendations with the patient.
Results were also provided in writing at the conclusion of the
visit. If applicable, a reminder letter will be sent to the patient
regarding the next appointment.

BI-RADS CATEGORY  3: Probably benign.

## 2019-04-17 ENCOUNTER — Other Ambulatory Visit: Payer: Self-pay

## 2019-04-17 DIAGNOSIS — Z20822 Contact with and (suspected) exposure to covid-19: Secondary | ICD-10-CM

## 2019-04-19 LAB — NOVEL CORONAVIRUS, NAA: SARS-CoV-2, NAA: NOT DETECTED

## 2019-05-17 ENCOUNTER — Other Ambulatory Visit: Payer: Self-pay

## 2020-06-06 HISTORY — PX: BREAST ENHANCEMENT SURGERY: SHX7

## 2020-09-05 ENCOUNTER — Other Ambulatory Visit: Payer: Self-pay

## 2020-09-05 ENCOUNTER — Emergency Department (HOSPITAL_BASED_OUTPATIENT_CLINIC_OR_DEPARTMENT_OTHER)
Admission: EM | Admit: 2020-09-05 | Discharge: 2020-09-05 | Disposition: A | Discharge status: Home / Self Care | Attending: Emergency Medicine | Admitting: Emergency Medicine

## 2020-09-05 DIAGNOSIS — S161XXA Strain of muscle, fascia and tendon at neck level, initial encounter: Secondary | ICD-10-CM | POA: Diagnosis not present

## 2020-09-05 DIAGNOSIS — R519 Headache, unspecified: Secondary | ICD-10-CM | POA: Diagnosis not present

## 2020-09-05 DIAGNOSIS — S199XXA Unspecified injury of neck, initial encounter: Secondary | ICD-10-CM | POA: Diagnosis present

## 2020-09-05 MED ORDER — KETOROLAC TROMETHAMINE 15 MG/ML IJ SOLN
30.0000 mg | Freq: Once | INTRAMUSCULAR | Status: AC
  Administered 2020-09-05: 30 mg via INTRAMUSCULAR
  Filled 2020-09-05: qty 2

## 2020-09-05 MED ORDER — CYCLOBENZAPRINE HCL 10 MG PO TABS: 10.0000 mg | ORAL_TABLET | Freq: Two times a day (BID) | ORAL | 0 refills | Status: DC | PRN

## 2020-09-05 MED ORDER — IBUPROFEN 800 MG PO TABS: 800.0000 mg | ORAL_TABLET | Freq: Three times a day (TID) | ORAL | 0 refills | Status: AC | PRN
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
## 2020-09-05 NOTE — Discharge Instructions (Addendum)
Take 800 mg of Motrin every 8 hours for the next 5 days and then as needed.  Take 1000 mg of Tylenol every 6 hours for the next 5 days and then as needed.  Take Flexeril as prescribed but do not mix with alcohol, drugs, driving, heavy machinery use as this can make you sleepy.

## 2020-09-30 DIAGNOSIS — R87619 Unspecified abnormal cytological findings in specimens from cervix uteri: Secondary | ICD-10-CM | POA: Insufficient documentation

## 2020-10-09 DIAGNOSIS — M546 Pain in thoracic spine: Secondary | ICD-10-CM | POA: Insufficient documentation

## 2020-12-31 ENCOUNTER — Other Ambulatory Visit: Payer: Self-pay

## 2021-01-01 ENCOUNTER — Ambulatory Visit (INDEPENDENT_AMBULATORY_CARE_PROVIDER_SITE_OTHER): Admitting: Nurse Practitioner

## 2021-01-01 ENCOUNTER — Encounter: Payer: Self-pay | Admitting: Nurse Practitioner

## 2021-01-01 VITALS — BP 116/68 | HR 76 | Temp 98.4°F | Ht 60.0 in | Wt 156.0 lb

## 2021-01-01 DIAGNOSIS — N63 Unspecified lump in unspecified breast: Secondary | ICD-10-CM | POA: Insufficient documentation

## 2021-01-01 DIAGNOSIS — G43909 Migraine, unspecified, not intractable, without status migrainosus: Secondary | ICD-10-CM | POA: Insufficient documentation

## 2021-01-01 DIAGNOSIS — E559 Vitamin D deficiency, unspecified: Secondary | ICD-10-CM | POA: Insufficient documentation

## 2021-01-01 DIAGNOSIS — L300 Nummular dermatitis: Secondary | ICD-10-CM | POA: Insufficient documentation

## 2021-01-01 MED ORDER — VITAMIN D (ERGOCALCIFEROL) 1.25 MG (50000 UNIT) PO CAPS: 50000.0000 [IU] | ORAL_CAPSULE | ORAL | 0 refills | Status: DC

## 2021-01-01 NOTE — Assessment & Plan Note (Signed)
Reviewed labs completed by GYN on 09/2020: 24 Current use of 1000IU from OTC supplement. She reports chronic fatigue and muscle weakness x 1year, worse in last 65months.  Increase dose to 50,000IU weekly Repeat lab in 33months

## 2021-01-01 NOTE — Patient Instructions (Signed)
Thank you for choosing [facility]  Go to lab for blood day  Sign medical release form to get your records from previous providers

## 2021-01-01 NOTE — Progress Notes (Signed)
Subjective:  Patient ID: Molly Rojas, female    DOB: September 02, 1985  Age: 35 y.o. MRN: [id number]  CC: Establish Care (NP- establish care.  No concerns. Not fasting today. )  HPI  Other labs from GYN 09/2020: HgbA1c of 5.5%, TSH or 3.8, CBC with diff was normal.  Vitamin D deficiency Reviewed labs completed by GYN on 09/2020: 24 Current use of 1000IU from OTC supplement. She reports chronic fatigue and muscle weakness x 1year, worse in last 57months.  Increase dose to 50,000IU weekly Repeat lab in 8months  Reviewed past Medical, Social and Family history today.  Outpatient Medications Prior to Visit  Medication Sig Dispense Refill   acetaminophen (TYLENOL) 500 MG tablet Take 1,000 mg by mouth every 6 (six) hours as needed for mild pain.     b complex vitamins capsule Take 1 capsule by mouth daily.     cholecalciferol (VITAMIN D3) 25 MCG (1000 UNIT) tablet Take 3,000 Units by mouth daily.     cyclobenzaprine (FLEXERIL) 10 MG tablet Take 1 tablet (10 mg total) by mouth 2 (two) times daily as needed for muscle spasms. 20 tablet 0   docusate sodium (COLACE) 100 MG capsule Take 1 capsule (100 mg total) by mouth 2 (two) times daily. 30 capsule 2   HYDROcodone-acetaminophen (NORCO/VICODIN) 5-325 MG tablet Take 1-2 tablets by mouth every 4 (four) hours as needed for moderate pain. 30 tablet 0   ibuprofen (ADVIL) 800 MG tablet Take 1 tablet (800 mg total) by mouth every 8 (eight) hours as needed for up to 30 doses. 30 tablet 0   magnesium gluconate (MAGONATE) 500 MG tablet Take 1,000 mg by mouth daily.      meloxicam (MOBIC) 15 MG tablet Take 15 mg by mouth daily.      polyethylene glycol powder (GLYCOLAX/MIRALAX) 17 GM/SCOOP powder Take by mouth.     Probiotic Product (PROBIOTIC-10) CAPS Take 2 capsules by mouth daily.      ferrous sulfate 325 (65 FE) MG EC tablet Take 1 tablet (325 mg total) by mouth 2 (two) times daily. 60 tablet 2   traMADol (ULTRAM) 50 MG tablet Take 1 tablet (50 mg total)  by mouth every 6 (six) hours as needed for moderate pain. 20 tablet 0   No facility-administered medications prior to visit.    ROS See HPI  Objective:  BP 116/68   Pulse 76   Temp 98.4 F (36.9 C) (Temporal)   Ht 5' (1.524 m)   Wt 156 lb (70.8 kg)   LMP 10/23/2017   SpO2 98%   BMI 30.47 kg/m   Physical Exam  Assessment & Plan:  This visit occurred during the SARS-CoV-2 public health emergency.  Safety protocols were in place, including screening questions prior to the visit, additional usage of staff PPE, and extensive cleaning of exam room while observing appropriate contact time as indicated for disinfecting solutions.   Molly Rojas was seen today for establish care.  Diagnoses and all orders for this visit:  Vitamin D deficiency -     Vitamin D, Ergocalciferol, (DRISDOL) 1.25 MG (50000 UNIT) CAPS capsule; Take 1 capsule (50,000 Units total) by mouth every 7 (seven) days. -     Cancel: Comprehensive metabolic panel -     Comprehensive metabolic panel   Problem List Items Addressed This Visit       Other   Vitamin D deficiency - Primary    Reviewed labs completed by GYN on 09/2020: 24 Current use of 1000IU from OTC  supplement. She reports chronic fatigue and muscle weakness x 1year, worse in last 74months.  Increase dose to 50,000IU weekly Repeat lab in 69months       Relevant Medications   Vitamin D, Ergocalciferol, (DRISDOL) 1.25 MG (50000 UNIT) CAPS capsule   Other Relevant Orders   Comprehensive metabolic panel    Follow-up: Return in about 3 months (around 04/03/2021) for fatigue.  Molly Penna, NP

## 2021-01-02 LAB — COMPREHENSIVE METABOLIC PANEL
AG Ratio: 1.8 (calc) (ref 1.0–2.5)
ALT: 12 U/L (ref 6–29)
AST: 16 U/L (ref 10–30)
Albumin: 4.5 g/dL (ref 3.6–5.1)
Alkaline phosphatase (APISO): 61 U/L (ref 31–125)
BUN: 16 mg/dL (ref 7–25)
CO2: 24 mmol/L (ref 20–32)
Calcium: 9.7 mg/dL (ref 8.6–10.2)
Chloride: 104 mmol/L (ref 98–110)
Creat: 0.76 mg/dL (ref 0.50–0.97)
Globulin: 2.5 g/dL (calc) (ref 1.9–3.7)
Glucose, Bld: 89 mg/dL (ref 65–99)
Potassium: 4.7 mmol/L (ref 3.5–5.3)
Sodium: 137 mmol/L (ref 135–146)
Total Bilirubin: 0.3 mg/dL (ref 0.2–1.2)
Total Protein: 7 g/dL (ref 6.1–8.1)

## 2021-04-05 ENCOUNTER — Other Ambulatory Visit: Payer: Self-pay

## 2021-04-06 ENCOUNTER — Encounter: Payer: Self-pay | Admitting: Nurse Practitioner

## 2021-04-06 ENCOUNTER — Ambulatory Visit (INDEPENDENT_AMBULATORY_CARE_PROVIDER_SITE_OTHER): Admitting: Nurse Practitioner

## 2021-04-06 VITALS — BP 96/62 | HR 72 | Temp 97.0°F | Ht 60.0 in | Wt 160.4 lb

## 2021-04-06 DIAGNOSIS — E559 Vitamin D deficiency, unspecified: Secondary | ICD-10-CM

## 2021-04-06 LAB — VITAMIN D 25 HYDROXY (VIT D DEFICIENCY, FRACTURES): VITD: 36.88 ng/mL (ref 30.00–100.00)

## 2021-04-06 MED ORDER — VITAMIN D3 125 MCG (5000 UT) PO CAPS
5000.0000 [IU] | ORAL_CAPSULE | Freq: Every day | ORAL | Status: AC
Start: 2021-04-06 — End: ?

## 2021-04-06 NOTE — Assessment & Plan Note (Addendum)
Improved fatigue with vitamin D supplement. Repeat vit. D level: improved to 36 Switch to 5000IU daily OTC dose

## 2021-04-06 NOTE — Patient Instructions (Addendum)
Sign medical release to get imaging results from [facility].  Sign medical release to get PAP results from GYN.  Go to lab for blood draw.

## 2021-04-06 NOTE — Progress Notes (Signed)
Subjective:  Patient ID: Molly Rojas, female    DOB: 08-03-1985  Age: 35 y.o. MRN: [id number]  CC: Follow-up (3 month f/u on fatigue. )  HPI  Vitamin D deficiency Improved fatigue with vitamin D supplement. Repeat vit. D level: improved to 36 Switch to 5000IU daily OTC dose  Reviewed past Medical, Social and Family history today.  Outpatient Medications Prior to Visit  Medication Sig Dispense Refill   acetaminophen (TYLENOL) 500 MG tablet Take 1,000 mg by mouth every 6 (six) hours as needed for mild pain.     b complex vitamins capsule Take 1 capsule by mouth daily.     cholecalciferol (VITAMIN D3) 25 MCG (1000 UNIT) tablet Take 3,000 Units by mouth daily.     cyclobenzaprine (FLEXERIL) 10 MG tablet Take 1 tablet (10 mg total) by mouth 2 (two) times daily as needed for muscle spasms. 20 tablet 0   docusate sodium (COLACE) 100 MG capsule Take 1 capsule (100 mg total) by mouth 2 (two) times daily. 30 capsule 2   HYDROcodone-acetaminophen (NORCO/VICODIN) 5-325 MG tablet Take 1-2 tablets by mouth every 4 (four) hours as needed for moderate pain. 30 tablet 0   ibuprofen (ADVIL) 800 MG tablet Take 1 tablet (800 mg total) by mouth every 8 (eight) hours as needed for up to 30 doses. 30 tablet 0   magnesium gluconate (MAGONATE) 500 MG tablet Take 1,000 mg by mouth daily.      meloxicam (MOBIC) 15 MG tablet Take 15 mg by mouth daily.      polyethylene glycol powder (GLYCOLAX/MIRALAX) 17 GM/SCOOP powder Take by mouth.     Probiotic Product (PROBIOTIC-10) CAPS Take 2 capsules by mouth daily.      Vitamin D, Ergocalciferol, (DRISDOL) 1.25 MG (50000 UNIT) CAPS capsule Take 1 capsule (50,000 Units total) by mouth every 7 (seven) days. 12 capsule 0   No facility-administered medications prior to visit.    ROS See HPI  Objective:  BP 96/62 (BP Location: Left Arm, Patient Position: Sitting, Cuff Size: Normal)   Pulse 72   Temp (!) 97 F (36.1 C) (Temporal)   Ht 5' (1.524 m)   Wt 160 lb 6.4  oz (72.8 kg)   LMP 10/23/2017   SpO2 97%   BMI 31.33 kg/m   Physical Exam Cardiovascular:     Rate and Rhythm: Normal rate.     Pulses: Normal pulses.  Pulmonary:     Effort: Pulmonary effort is normal.     Breath sounds: Normal breath sounds.  Musculoskeletal:     Cervical back: Normal range of motion and neck supple.  Neurological:     Mental Status: She is alert and oriented to person, place, and time.   Assessment & Plan:  This visit occurred during the SARS-CoV-2 public health emergency.  Safety protocols were in place, including screening questions prior to the visit, additional usage of staff PPE, and extensive cleaning of exam room while observing appropriate contact time as indicated for disinfecting solutions.   Tabby was seen today for follow-up.  Diagnoses and all orders for this visit:  Vitamin D deficiency -     Vitamin D (25 hydroxy)   Problem List Items Addressed This Visit       Other   Vitamin D deficiency - Primary    Improved fatigue with vitamin D supplement. Repeat vit. D level: improved to 36 Switch to 5000IU daily OTC dose      Relevant Orders   Vitamin D (25 hydroxy) (  Completed)    Follow-up: Return in about 3 months (around 07/07/2021) for CPE(fasting).  [name], NP

## 2021-04-26 ENCOUNTER — Telehealth: Payer: Self-pay | Admitting: Nurse Practitioner

## 2021-04-26 NOTE — Telephone Encounter (Signed)
Pt called saying her kids are having flu symptoms and she was wondering if she could get Tamiflu.

## 2021-04-28 NOTE — Telephone Encounter (Signed)
Spoke patient and she states her children have the flu and she was the one requesting Tamiflu for herself. Pt does not have any symptoms at this time but states she will be going to work Monday and would like know what to do. Please advise

## 2021-05-01 ENCOUNTER — Other Ambulatory Visit: Payer: Self-pay

## 2021-05-01 ENCOUNTER — Emergency Department (HOSPITAL_BASED_OUTPATIENT_CLINIC_OR_DEPARTMENT_OTHER)
Admission: EM | Admit: 2021-05-01 | Discharge: 2021-05-01 | Disposition: A | Discharge status: Home / Self Care | Attending: Emergency Medicine | Admitting: Emergency Medicine

## 2021-05-01 ENCOUNTER — Encounter (HOSPITAL_BASED_OUTPATIENT_CLINIC_OR_DEPARTMENT_OTHER): Payer: Self-pay | Admitting: *Deleted

## 2021-05-01 ENCOUNTER — Emergency Department (HOSPITAL_BASED_OUTPATIENT_CLINIC_OR_DEPARTMENT_OTHER)

## 2021-05-01 DIAGNOSIS — Z87891 Personal history of nicotine dependence: Secondary | ICD-10-CM | POA: Diagnosis not present

## 2021-05-01 DIAGNOSIS — J111 Influenza due to unidentified influenza virus with other respiratory manifestations: Secondary | ICD-10-CM | POA: Diagnosis not present

## 2021-05-01 DIAGNOSIS — R059 Cough, unspecified: Secondary | ICD-10-CM | POA: Diagnosis present

## 2021-05-01 MED ORDER — ALBUTEROL SULFATE HFA 108 (90 BASE) MCG/ACT IN AERS
2.0000 | INHALATION_SPRAY | Freq: Once | RESPIRATORY_TRACT | Status: AC
  Administered 2021-05-01: 2 via RESPIRATORY_TRACT
  Filled 2021-05-01: qty 6.7

## 2021-05-01 NOTE — ED Triage Notes (Signed)
Children have had the flu for about 8 days going through all children. 4 days ago she started to feel bad with fever starting yesterday. Pt took Tylenol PTA temp now 99.3

## 2021-05-01 NOTE — ED Provider Notes (Signed)
[facility] EMERGENCY DEPT Provider Note   CSN: [id number] Arrival date & time: 05/01/21  1016     History Chief Complaint  Patient presents with   Influenza    Molly Rojas is a 35 y.o. female.  HPI Patient reports symptoms onset 4 days ago.  She is having cough and chest congestion.  She reports that symptoms worsened over the past 2 days with now wheezing and increased cough.  Fevers waxing and waning.  T-max has been 104.  Patient also reports she is got sore throat.  Burns with swallowing but not difficult to swallow.  Patient's been achy.  No vomiting.  Mild nausea.  Patient has tried Tylenol at home hour prior to arrival.  It is helping control fever.  Patient reports her child has tested flu +8 days ago.  The patient herself became symptomatic 4 days ago.  Child is still running fevers.  Patient denies any significant medical history.  She does not have history of asthma, diabetes or hypertension.    Past Medical History:  Diagnosis Date   Abnormal Pap smear    Anemia    with pregnancy   Anxiety    GERD (gastroesophageal reflux disease)    with pregnancy    Headache(784.0)    HPV (human papilloma virus) infection    No pertinent past medical history    Peripheral vascular disease (HCC) 03/2017   burned vein   Preterm contractions 08/17/2016   S/P cesarean section 07/22/2015   Scoliosis    Scoliosis    Umbilical hernia    Vaginal Pap smear, abnormal     Patient Active Problem List   Diagnosis Date Noted   Vitamin D deficiency 01/01/2021   Breast lump or mass 01/01/2021   Migraine headache 01/01/2021   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   Pain in thoracic spine 10/09/2020   S/P hysterectomy 04/19/2018   Degeneration of lumbar intervertebral disc 06/27/2017   Scoliosis    HPV (human papilloma virus) infection    Anxiety    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   Umbilical hernia without obstruction and  without gangrene 04/26/2016    Past Surgical History:  Procedure Laterality Date   ABDOMINAL HYSTERECTOMY     BACK SURGERY     BREAST ENHANCEMENT SURGERY     CESAREAN SECTION     CESAREAN SECTION  03/28/2011   Procedure: CESAREAN SECTION;  Surgeon: Juluis Mire;  Location: [facility];  Service: Gynecology;  Laterality: N/A;   CESAREAN SECTION N/A 07/22/2015   Procedure: CESAREAN SECTION;  Surgeon: Zelphia Cairo, MD;  Location: [facility];  Service: Obstetrics;  Laterality: N/A;  Repeat edc 07/28/15 NKDA   CESAREAN SECTION N/A 08/18/2016   Procedure: CESAREAN SECTION;  Surgeon: Richarda Overlie, MD;  Location: [facility];  Service: Obstetrics;  Laterality: N/A;   CYSTOSCOPY N/A 04/19/2018   Procedure: CYSTOSCOPY;  Surgeon: [name], MD;  Location: [facility];  Service: Gynecology;  Laterality: N/A;   DENTAL SURGERY     ENDOVENOUS ABLATION SAPHENOUS VEIN W/ LASER Right 06/05/2017   endovenous laser ablation R GSV and stab phlebectomy 10-20 incisions R leg by Josephina Gip MD    HERNIA REPAIR     HYSTERECTOMY ABDOMINAL WITH SALPINGO-OOPHORECTOMY Bilateral 04/19/2018   Procedure: HYSTERECTOMY ABDOMINAL WITH BILATERAL SALPINGECTOMY;  Surgeon: [name], MD;  Location: [facility];  Service: Gynecology;  Laterality: Bilateral;   SPINAL FUSION  placed rods from T-11-L4   for scoliosis, spinal  fusion with screws and rods   tummy tuck     UMBILICAL HERNIA REPAIR N/A 04/19/2018   Procedure: OPEN UMBILICAL HERNIA REPAIR;  Surgeon: Gaynelle Adu, MD;  Location: [facility];  Service: General;  Laterality: N/A;   WISDOM TOOTH EXTRACTION       OB History     Gravida  3   Para  3   Term  2   Preterm  1   AB  0   Living  3      SAB  0   IAB  0   Ectopic  0   Multiple  0   Live Births  3           Family History  Problem Relation Age of Onset   Diabetes Mother    Thyroid disease Mother    Heart disease Father    Hyperlipidemia Father    Hypertension  Father    Mental illness Brother    Diabetes Maternal Aunt    Diabetes Maternal Uncle    Diabetes Maternal Grandfather    Hyperlipidemia Paternal Grandmother    Early death Paternal Grandfather 7   Heart disease Paternal Grandfather    Hyperlipidemia Paternal Grandfather    Hypertension Paternal Grandfather    Anesthesia problems Neg Hx    Hypotension Neg Hx    Malignant hyperthermia Neg Hx    Pseudochol deficiency Neg Hx     Social History   Tobacco Use   Smoking status: Never   Smokeless tobacco: Former  Building services engineer Use: Never used  Substance Use Topics   Alcohol use: Yes    Alcohol/week: 3.0 standard drinks    Types: 3 Glasses of wine per week    Comment: occasional    Drug use: No    Home Medications Prior to Admission medications   Medication Sig Start Date End Date Taking? Authorizing Provider  gabapentin (NEURONTIN) 100 MG capsule Take 100 mg by mouth daily.   Yes [provider]  acetaminophen (TYLENOL) 500 MG tablet Take 1,000 mg by mouth every 6 (six) hours as needed for mild pain.    [provider]  b complex vitamins capsule Take 1 capsule by mouth daily.    [provider]  Cholecalciferol (VITAMIN D3) 125 MCG (5000 UT) CAPS Take 1 capsule (5,000 Units total) by mouth daily. 04/06/21   Nche, Bonna Gains, NP  cyclobenzaprine (FLEXERIL) 10 MG tablet Take 1 tablet (10 mg total) by mouth 2 (two) times daily as needed for muscle spasms. 09/05/20   Curatolo, Adam, DO  docusate sodium (COLACE) 100 MG capsule Take 1 capsule (100 mg total) by mouth 2 (two) times daily. 04/21/18   [name], MD  HYDROcodone-acetaminophen (NORCO/VICODIN) 5-325 MG tablet Take 1-2 tablets by mouth every 4 (four) hours as needed for moderate pain. 04/21/18   [name], MD  ibuprofen (ADVIL) 800 MG tablet Take 1 tablet (800 mg total) by mouth every 8 (eight) hours as needed for up to 30 doses. 09/05/20   Curatolo, Adam, DO  magnesium  gluconate (MAGONATE) 500 MG tablet Take 1,000 mg by mouth daily.     [provider]  meloxicam (MOBIC) 15 MG tablet Take 15 mg by mouth daily.     [provider]  polyethylene glycol powder (GLYCOLAX/MIRALAX) 17 GM/SCOOP powder Take by mouth.    [provider]  Probiotic Product (PROBIOTIC-10) CAPS Take 2 capsules by mouth daily.     [provider]    Allergies    Percocet [oxycodone-acetaminophen]  Review of Systems   Review of Systems 10 systems reviewed negative except as per HPI Physical Exam Updated Vital Signs BP 126/80 (BP Location: Right Arm)   Pulse 96   Temp 99.3 F (37.4 C) (Oral)   Resp 17   Ht 5' (1.524 m)   Wt 70.3 kg   LMP 10/23/2017   SpO2 100%   BMI 30.27 kg/m   Physical Exam Constitutional:      Comments: Alert nontoxic no respiratory distress  HENT:     Nose: Nose normal.     Mouth/Throat:     Comments: Mucous membranes moist.  Diffuse erythema of tonsillar pillars and tonsils without exudate. Cardiovascular:     Rate and Rhythm: Normal rate and regular rhythm.  Pulmonary:     Comments: Cough with deep inspiration.  Occasional wheeze at the bases.  Good airflow. Abdominal:     General: There is no distension.     Palpations: Abdomen is soft.     Tenderness: There is no abdominal tenderness. There is no guarding.  Musculoskeletal:        General: No swelling or tenderness. Normal range of motion.     Cervical back: Neck supple.     Right lower leg: No edema.     Left lower leg: No edema.  Skin:    General: Skin is warm and dry.  Neurological:     General: No focal deficit present.     Mental Status: She is oriented to person, place, and time.     Coordination: Coordination normal.  Psychiatric:        Mood and Affect: Mood normal.    ED Results / Procedures / Treatments   Labs (all labs ordered are listed, but only abnormal results are displayed) Labs Reviewed - No data to  display  EKG None  Radiology DG Chest [facility] 1 View  Result Date: 05/01/2021 CLINICAL DATA:  States her kids all have the flu and she's had fever, SOB, body aches x 4 days EXAM: PORTABLE CHEST 1 VIEW COMPARISON:  None. FINDINGS: Cardiac silhouette is normal in size. No mediastinal or hilar masses. Clear lungs.  No convincing pleural effusion and no pneumothorax. Previous thoracolumbar fusion. Moderate dextroscoliosis of the thoracic spine. IMPRESSION: No active cardiopulmonary disease. Electronically Signed   By: Amie Portland M.D.   On: 05/01/2021 11:12    Procedures Procedures   Medications Ordered in ED Medications  albuterol (VENTOLIN HFA) 108 (90 Base) MCG/ACT inhaler 2 puff (2 puffs Inhalation Given 05/01/21 1053)    ED Course  I have reviewed the triage vital signs and the nursing notes.  Pertinent labs & imaging results that were available during my care of the patient were reviewed by me and considered in my medical decision making (see chart for details).    MDM Rules/Calculators/A&P                           Patient felt improved after albuterol inhaler therapy.  Chest x-ray does not show any secondary pneumonia.  Patient is otherwise healthy.  She has known direct exposure to influenza.  At this time stable for continued outpatient management.  Counseling given for symptomatic control with ibuprofen and Tylenol.  Turn precautions reviewed Final Clinical Impression(s) / ED Diagnoses Final diagnoses:  Influenza    Rx / DC Orders ED Discharge Orders     None  Arby Barrette, MD 05/01/21 1236

## 2021-05-01 NOTE — Discharge Instructions (Signed)
1.  Use your albuterol inhaler 2 puffs every 4 hours if needed for wheezing and coughing.  You may take extra strength Tylenol every 6 hours for body aches and fever.  You may also take over-the-counter ibuprofen if you need extra pain or fever control. 2.  Try to rest and hydrate.  Return to emergency department if you are getting difficulty breathing or other concerning symptoms develop

## 2021-05-03 NOTE — Telephone Encounter (Signed)
Pt states she is now sick and went to the ED on Saturday and was given a inhaler. Pt states she did have a fever for 4 days but it is now gone. She just wanted to make you aware of what has been going on. I informed patient to call our office if symptoms did not improve or became worse. Pt verbalized understanding.

## 2021-06-22 ENCOUNTER — Encounter: Payer: Self-pay | Admitting: Nurse Practitioner

## 2021-06-22 DIAGNOSIS — E559 Vitamin D deficiency, unspecified: Secondary | ICD-10-CM

## 2021-06-29 ENCOUNTER — Other Ambulatory Visit: Payer: Self-pay

## 2021-06-29 ENCOUNTER — Other Ambulatory Visit (INDEPENDENT_AMBULATORY_CARE_PROVIDER_SITE_OTHER)

## 2021-06-29 DIAGNOSIS — E559 Vitamin D deficiency, unspecified: Secondary | ICD-10-CM | POA: Diagnosis not present

## 2021-06-29 LAB — VITAMIN D 25 HYDROXY (VIT D DEFICIENCY, FRACTURES): VITD: 48.5 ng/mL (ref 30.00–100.00)

## 2021-08-12 ENCOUNTER — Ambulatory Visit (INDEPENDENT_AMBULATORY_CARE_PROVIDER_SITE_OTHER): Admitting: Family Medicine

## 2021-08-12 ENCOUNTER — Encounter: Payer: Self-pay | Admitting: Family Medicine

## 2021-08-12 ENCOUNTER — Other Ambulatory Visit: Payer: Self-pay

## 2021-08-12 ENCOUNTER — Encounter: Payer: Self-pay | Admitting: Nurse Practitioner

## 2021-08-12 VITALS — BP 142/74 | HR 106 | Temp 97.8°F | Ht 60.0 in | Wt 167.8 lb

## 2021-08-12 DIAGNOSIS — J0110 Acute frontal sinusitis, unspecified: Secondary | ICD-10-CM

## 2021-08-12 DIAGNOSIS — H6983 Other specified disorders of Eustachian tube, bilateral: Secondary | ICD-10-CM

## 2021-08-12 DIAGNOSIS — H6993 Unspecified Eustachian tube disorder, bilateral: Secondary | ICD-10-CM

## 2021-08-12 MED ORDER — PREDNISONE 10 MG PO TABS: 10.0000 mg | ORAL_TABLET | Freq: Every day | ORAL | 0 refills | Status: AC

## 2021-08-12 MED ORDER — AMOXICILLIN 875 MG PO TABS: 875.0000 mg | ORAL_TABLET | Freq: Two times a day (BID) | ORAL | 0 refills | Status: AC

## 2021-08-12 NOTE — Progress Notes (Signed)
Established Patient Office Visit  Subjective:  Patient ID: Molly Rojas, female    DOB: [birth year]  Age: 36 y.o. MRN: [id number]  CC:  Chief Complaint  Patient presents with   Sinusitis    Sinus issues green mucus, little cough, lots of sinus pressure x 2 days.     HPI Molly FantasiaBelinda Mckey presents for evaluation of a 12-day history of sneezing, itchy watery eyes ears nose and throat and mild cough.  Over the last few days she is developed frontal sinus pressure with copious green rhinorrhea and postnasal drip.  There is ear congestion with hearing impairment.  She has been using Flonase and Zyrtec with some relief.  There is been no fever or chills wheezing or difficulty breathing.  Past Medical History:  Diagnosis Date   Abnormal Pap smear    Anemia    with pregnancy   Anxiety    GERD (gastroesophageal reflux disease)    with pregnancy    Headache(784.0)    HPV (human papilloma virus) infection    No pertinent past medical history    Peripheral vascular disease (HCC) 03/2017   burned vein   Preterm contractions 08/17/2016   S/P cesarean section 07/22/2015   Scoliosis    Scoliosis    Umbilical hernia    Vaginal Pap smear, abnormal     Past Surgical History:  Procedure Laterality Date   ABDOMINAL HYSTERECTOMY     BACK SURGERY     BREAST ENHANCEMENT SURGERY     CESAREAN SECTION     CESAREAN SECTION  03/28/2011   Procedure: CESAREAN SECTION;  Surgeon: Juluis MireJohn S McComb;  Location: [facility];  Service: Gynecology;  Laterality: N/A;   CESAREAN SECTION N/A 07/22/2015   Procedure: CESAREAN SECTION;  Surgeon: Zelphia CairoGretchen Adkins, MD;  Location: [facility];  Service: Obstetrics;  Laterality: N/A;  Repeat edc 07/28/15 NKDA   CESAREAN SECTION N/A 08/18/2016   Procedure: CESAREAN SECTION;  Surgeon: Richarda Overlieichard Holland, MD;  [facility];  Service: Obstetrics;  Laterality: N/A;   CYSTOSCOPY N/A 04/19/2018   Procedure: CYSTOSCOPY;  Surgeon: Ranae PilaLeger, Elise Jennifer, MD;  Location: [facility];   Service: Gynecology;  Laterality: N/A;   DENTAL SURGERY     ENDOVENOUS ABLATION SAPHENOUS VEIN W/ LASER Right 06/05/2017   endovenous laser ablation R GSV and stab phlebectomy 10-20 incisions R leg by Josephina GipJames Lawson MD    HERNIA REPAIR     HYSTERECTOMY ABDOMINAL WITH SALPINGO-OOPHORECTOMY Bilateral 04/19/2018   Procedure: HYSTERECTOMY ABDOMINAL WITH BILATERAL SALPINGECTOMY;  Surgeon: Ranae PilaLeger, Elise Jennifer, MD;  Location: [facility];  Service: Gynecology;  Laterality: Bilateral;   SPINAL FUSION  placed rods from T-11-L4   for scoliosis, spinal fusion with screws and rods   tummy tuck     UMBILICAL HERNIA REPAIR N/A 04/19/2018   Procedure: OPEN UMBILICAL HERNIA REPAIR;  Surgeon: Gaynelle AduWilson, Eric, MD;  Location: [facility];  Service: General;  Laterality: N/A;   WISDOM TOOTH EXTRACTION      Family History  Problem Relation Age of Onset   Diabetes Mother    Thyroid disease Mother    Heart disease Father    Hyperlipidemia Father    Hypertension Father    Mental illness Brother    Diabetes Maternal Aunt    Diabetes Maternal Uncle    Diabetes Maternal Grandfather    Hyperlipidemia Paternal Grandmother    Early death Paternal Grandfather [id number]   Heart disease Paternal Grandfather    Hyperlipidemia Paternal Grandfather    Hypertension Paternal ActorGrandfather  Anesthesia problems Neg Hx    Hypotension Neg Hx    Malignant hyperthermia Neg Hx    Pseudochol deficiency Neg Hx     Social History   Socioeconomic History   Marital status: Married    Spouse name: Alroy Dust   Number of children: 3   Years of education: Not on file   Highest education level: Not on file  Occupational History   Not on file  Tobacco Use   Smoking status: Never   Smokeless tobacco: Former  Scientific laboratory technician Use: Never used  Substance and Sexual Activity   Alcohol use: Yes    Alcohol/week: 3.0 standard drinks    Types: 3 Glasses of wine per week    Comment: occasional    Drug use: No   Sexual activity: Yes    Birth  control/protection: Surgical    Comment: s/p hysterectomy  Other Topics Concern   Not on file  Social History Narrative   She is from [place], [place] but has been in [place] for 14 years. Lives at home with husband and 3 kids. Not fasting today.    Social Determinants of Health   Financial Resource Strain: Not on file  Food Insecurity: Not on file  Transportation Needs: Not on file  Physical Activity: Not on file  Stress: Not on file  Social Connections: Not on file  Intimate Partner Violence: Not on file    Outpatient Medications Prior to Visit  Medication Sig Dispense Refill   acetaminophen (TYLENOL) 500 MG tablet Take 1,000 mg by mouth every 6 (six) hours as needed for mild pain.     b complex vitamins capsule Take 1 capsule by mouth daily.     Cholecalciferol (VITAMIN D3) 125 MCG (5000 UT) CAPS Take 1 capsule (5,000 Units total) by mouth daily. 30 capsule    cyclobenzaprine (FLEXERIL) 10 MG tablet Take 1 tablet (10 mg total) by mouth 2 (two) times daily as needed for muscle spasms. 20 tablet 0   docusate sodium (COLACE) 100 MG capsule Take 1 capsule (100 mg total) by mouth 2 (two) times daily. 30 capsule 2   ibuprofen (ADVIL) 800 MG tablet Take 1 tablet (800 mg total) by mouth every 8 (eight) hours as needed for up to 30 doses. 30 tablet 0   magnesium gluconate (MAGONATE) 500 MG tablet Take 1,000 mg by mouth daily.      polyethylene glycol powder (GLYCOLAX/MIRALAX) 17 GM/SCOOP powder Take by mouth.     pregabalin (LYRICA) 25 MG capsule Take 25-75 mg by mouth at bedtime.     gabapentin (NEURONTIN) 100 MG capsule Take 100 mg by mouth daily. (Patient not taking: Reported on 08/12/2021)     meloxicam (MOBIC) 15 MG tablet Take 15 mg by mouth daily.  (Patient not taking: Reported on 08/12/2021)     Probiotic Product (PROBIOTIC-10) CAPS Take 2 capsules by mouth daily.  (Patient not taking: Reported on 08/12/2021)     HYDROcodone-acetaminophen (NORCO/VICODIN) 5-325 MG tablet Take 1-2  tablets by mouth every 4 (four) hours as needed for moderate pain. 30 tablet 0   No facility-administered medications prior to visit.    Allergies  Allergen Reactions   Percocet [Oxycodone-Acetaminophen] Other (See Comments)    hallucinations    ROS Review of Systems  Constitutional:  Negative for chills, diaphoresis, fatigue, fever and unexpected weight change.  HENT:  Positive for congestion, hearing loss, postnasal drip, rhinorrhea, sinus pressure, sinus pain and sneezing. Negative for dental problem, ear pain and  trouble swallowing.   Eyes:  Negative for photophobia and visual disturbance.  Respiratory:  Negative for cough and wheezing.   Cardiovascular:  Negative for chest pain and palpitations.  Gastrointestinal: Negative.   Endocrine: Negative for polyphagia and polyuria.  Genitourinary: Negative.   Neurological:  Negative for weakness and headaches.     Objective:    Physical Exam Vitals reviewed.  Constitutional:      General: She is not in acute distress.    Appearance: Normal appearance. She is not ill-appearing, toxic-appearing or diaphoretic.  HENT:     Head: Normocephalic and atraumatic.      Right Ear: Ear canal and external ear normal. No middle ear effusion. Tympanic membrane is retracted. Tympanic membrane is not erythematous.     Left Ear: Ear canal and external ear normal.  No middle ear effusion. Tympanic membrane is retracted. Tympanic membrane is not erythematous.     Mouth/Throat:     Mouth: Mucous membranes are moist.     Pharynx: Oropharynx is clear. No oropharyngeal exudate or posterior oropharyngeal erythema.  Eyes:     General: No scleral icterus.       Right eye: No discharge.        Left eye: No discharge.     Extraocular Movements: Extraocular movements intact.     Conjunctiva/sclera: Conjunctivae normal.     Pupils: Pupils are equal, round, and reactive to light.  Neck:     Vascular: No carotid bruit.  Cardiovascular:     Rate and  Rhythm: Normal rate and regular rhythm.  Pulmonary:     Effort: Pulmonary effort is normal. No respiratory distress.     Breath sounds: Normal breath sounds. No stridor. No wheezing, rhonchi or rales.  Abdominal:     General: Bowel sounds are normal.  Musculoskeletal:     Cervical back: No rigidity or tenderness.  Lymphadenopathy:     Cervical: No cervical adenopathy.  Skin:    General: Skin is warm and dry.  Neurological:     Mental Status: She is alert and oriented to person, place, and time.  Psychiatric:        Mood and Affect: Mood normal.        Behavior: Behavior normal.    BP (!) 142/74 (BP Location: Right Leg, Patient Position: Sitting, Cuff Size: Normal)    Pulse (!) 106    Temp 97.8 F (36.6 C) (Temporal)    Ht 5' (1.524 m)    Wt 167 lb 12.8 oz (76.1 kg)    LMP 10/23/2017    SpO2 95%    BMI 32.77 kg/m  Wt Readings from Last 3 Encounters:  08/12/21 167 lb 12.8 oz (76.1 kg)  05/01/21 155 lb (70.3 kg)  04/06/21 160 lb 6.4 oz (72.8 kg)     Health Maintenance Due  Topic Date Due   Hepatitis C Screening  Never done    There are no preventive care reminders to display for this patient.  Lab Results  Component Value Date   TSH 3.030 07/05/2017   Lab Results  Component Value Date   WBC 6.9 04/20/2018   HGB 9.8 (L) 04/20/2018   HCT 29.2 (L) 04/20/2018   MCV 91.0 04/20/2018   PLT 214 04/20/2018   Lab Results  Component Value Date   NA 137 01/01/2021   K 4.7 01/01/2021   CO2 24 01/01/2021   GLUCOSE 89 01/01/2021   BUN 16 01/01/2021   CREATININE 0.76 01/01/2021   BILITOT 0.3 01/01/2021  ALKPHOS 60 07/05/2017   AST 16 01/01/2021   ALT 12 01/01/2021   PROT 7.0 01/01/2021   ALBUMIN 4.7 07/05/2017   CALCIUM 9.7 01/01/2021   Lab Results  Component Value Date   CHOL 194 07/05/2017   Lab Results  Component Value Date   HDL 69 07/05/2017   Lab Results  Component Value Date   LDLCALC 111 (H) 07/05/2017   Lab Results  Component Value Date   TRIG 68  07/05/2017   Lab Results  Component Value Date   CHOLHDL 2.8 07/05/2017   Lab Results  Component Value Date   HGBA1C 5.3 07/05/2017      Assessment & Plan:   Problem List Items Addressed This Visit   None Visit Diagnoses     Acute frontal sinusitis, recurrence not specified    -  Primary   Relevant Medications   amoxicillin (AMOXIL) 875 MG tablet   predniSONE (DELTASONE) 10 MG tablet   Dysfunction of both eustachian tubes       Relevant Medications   predniSONE (DELTASONE) 10 MG tablet       Meds ordered this encounter  Medications   amoxicillin (AMOXIL) 875 MG tablet    Sig: Take 1 tablet (875 mg total) by mouth 2 (two) times daily for 10 days.    Dispense:  20 tablet    Refill:  0   predniSONE (DELTASONE) 10 MG tablet    Sig: Take 1 tablet (10 mg total) by mouth daily with breakfast for 7 days.    Dispense:  7 tablet    Refill:  0    Follow-up: Return continue flonase, hold zyrtec and add mucinex.Libby Maw, MD

## 2021-09-22 ENCOUNTER — Encounter: Payer: Self-pay | Admitting: Neurology

## 2021-10-12 ENCOUNTER — Ambulatory Visit (INDEPENDENT_AMBULATORY_CARE_PROVIDER_SITE_OTHER): Admitting: Nurse Practitioner

## 2021-10-12 ENCOUNTER — Encounter: Payer: Self-pay | Admitting: Nurse Practitioner

## 2021-10-12 VITALS — BP 130/82 | HR 72 | Temp 97.4°F | Ht 60.0 in | Wt 163.8 lb

## 2021-10-12 DIAGNOSIS — Z136 Encounter for screening for cardiovascular disorders: Secondary | ICD-10-CM

## 2021-10-12 DIAGNOSIS — N63 Unspecified lump in unspecified breast: Secondary | ICD-10-CM | POA: Diagnosis not present

## 2021-10-12 DIAGNOSIS — Z13220 Encounter for screening for lipoid disorders: Secondary | ICD-10-CM

## 2021-10-12 DIAGNOSIS — Z833 Family history of diabetes mellitus: Secondary | ICD-10-CM

## 2021-10-12 DIAGNOSIS — Z9882 Breast implant status: Secondary | ICD-10-CM | POA: Insufficient documentation

## 2021-10-12 DIAGNOSIS — Z0001 Encounter for general adult medical examination with abnormal findings: Secondary | ICD-10-CM

## 2021-10-12 DIAGNOSIS — R61 Generalized hyperhidrosis: Secondary | ICD-10-CM

## 2021-10-12 LAB — COMPREHENSIVE METABOLIC PANEL
ALT: 16 U/L (ref 0–35)
AST: 18 U/L (ref 0–37)
Albumin: 4.7 g/dL (ref 3.5–5.2)
Alkaline Phosphatase: 52 U/L (ref 39–117)
BUN: 12 mg/dL (ref 6–23)
CO2: 26 mEq/L (ref 19–32)
Calcium: 9.3 mg/dL (ref 8.4–10.5)
Chloride: 104 mEq/L (ref 96–112)
Creatinine, Ser: 0.78 mg/dL (ref 0.40–1.20)
GFR: 98.82 mL/min (ref >60.00)
Glucose, Bld: 94 mg/dL (ref 70–99)
Potassium: 4.3 mEq/L (ref 3.5–5.1)
Sodium: 138 mEq/L (ref 135–145)
Total Bilirubin: 0.4 mg/dL (ref 0.2–1.2)
Total Protein: 7.4 g/dL (ref 6.0–8.3)

## 2021-10-12 LAB — CBC
HCT: 40.9 % (ref 36.0–46.0)
Hemoglobin: 14 g/dL (ref 12.0–15.0)
MCHC: 34.3 g/dL (ref 30.0–36.0)
MCV: 91.8 fl (ref 78.0–100.0)
Platelets: 255 10*3/uL (ref 150.0–400.0)
RBC: 4.46 Mil/uL (ref 3.87–5.11)
RDW: 12.5 % (ref 11.5–15.5)
WBC: 5.6 10*3/uL (ref 4.0–10.5)

## 2021-10-12 LAB — TSH: TSH: 2.04 u[IU]/mL (ref 0.35–5.50)

## 2021-10-12 LAB — LIPID PANEL
Cholesterol: 199 mg/dL (ref 0–200)
HDL: 56.2 mg/dL (ref >39.00)
LDL Cholesterol: 110 mg/dL — ABNORMAL HIGH (ref 0–99)
NonHDL: 142.67
Total CHOL/HDL Ratio: 4
Triglycerides: 161 mg/dL — ABNORMAL HIGH (ref 0.0–149.0)
VLDL: 32.2 mg/dL (ref 0.0–40.0)

## 2021-10-12 LAB — HEMOGLOBIN A1C: Hgb A1c MFr Bld: 5.1 % (ref 4.6–6.5)

## 2021-10-12 NOTE — Progress Notes (Signed)
? ?Complete physical exam ? ?Patient: Molly Rojas   DOB: 1985/07/24   36 y.o. Female  MRN: [id number] ?Visit Date: 10/12/2021 ? ?Subjective:  ?  ?Chief Complaint  ?Patient presents with  ? Annual Exam  ?  Physical-Breast exam, no pap needed (hysterectomy). ?Pt is fasting ?Pt has concerns regarding night sweats and hormones.   ? ?Molly Rojas is a 36 y.o. female who presents today for a complete physical exam. She reports consuming a general diet.  2-3x week, cardio and light weight training.  She generally feels well. She reports sleeping well. She does have additional problems to discuss today.  ?Vision:No ?Dental:Yes ?STD Screen:No ? ?Night sweat at 2019 ?Most recent fall risk assessment: ? ?  08/12/2021  ?  2:49 PM  ?Fall Risk   ?Falls in the past year? 0  ?Number falls in past yr: 0  ? ?Most recent depression screenings: ? ?  08/12/2021  ?  2:49 PM 04/06/2021  ?  8:38 AM  ?PHQ 2/9 Scores  ?PHQ - 2 Score 0 0  ?PHQ- 9 Score  6  ? ?HPI  ?Today she also reports chronic night sweats. She is unsure about onset, but states it has been over 1year. No other associated symptoms. ? 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 ?RECOMMENDATION:Short-term interval follow-up bilateral breast ultrasound in 63months is recommended. Options of ultrasound-guided core biopsies and surgical excision discussed with the patient. ? ?Ordered repeat bilateral breast US ? ?Family history of diabetes mellitus (DM) ?Mother auts, uncle and MGM diagnosed in 44s. ?She requested for hgbA1c check today. ?No hx of impaired fasting glucose ? ?Past Medical History:  ?Diagnosis Date  ? Abnormal Pap smear   ? Anemia   ? with pregnancy  ? Anxiety   ? GERD (gastroesophageal reflux disease)   ? with pregnancy   ? Headache(784.0)   ? HPV (human papilloma virus) infection   ? No pertinent past medical history   ? Peripheral vascular disease (HCC) 03/2017  ? burned vein  ? Preterm contractions 08/17/2016  ? S/P cesarean  section 07/22/2015  ? Scoliosis   ? Scoliosis   ? Umbilical hernia   ? Vaginal Pap smear, abnormal   ? ?Past Surgical History:  ?Procedure Laterality Date  ? ABDOMINAL HYSTERECTOMY    ? BACK SURGERY    ? BREAST ENHANCEMENT SURGERY    ? CESAREAN SECTION    ? CESAREAN SECTION  03/28/2011  ? Procedure: CESAREAN SECTION;  Surgeon: Juluis Mire;  Location: [facility];  Service: Gynecology;  Laterality: N/A;  ? CESAREAN SECTION N/A 07/22/2015  ? Procedure: CESAREAN SECTION;  Surgeon: Zelphia Cairo, MD;  Location: [facility];  Service: Obstetrics;  Laterality: N/A;  Repeat ?edc 07/28/15 ?NKDA  ? CESAREAN SECTION N/A 08/18/2016  ? Procedure: CESAREAN SECTION;  Surgeon: Richarda Overlie, MD;  Location: [facility];  Service: Obstetrics;  Laterality: N/A;  ? CYSTOSCOPY N/A 04/19/2018  ? Procedure: CYSTOSCOPY;  Surgeon: [name], MD;  Location: [facility];  Service: Gynecology;  Laterality: N/A;  ? DENTAL SURGERY    ? ENDOVENOUS ABLATION SAPHENOUS VEIN W/ LASER Right 06/05/2017  ? endovenous laser ablation R GSV and stab phlebectomy 10-20 incisions R leg by Josephina Gip MD   ? HERNIA REPAIR    ? HYSTERECTOMY ABDOMINAL WITH SALPINGO-OOPHORECTOMY Bilateral 04/19/2018  ? Procedure: HYSTERECTOMY ABDOMINAL WITH BILATERAL SALPINGECTOMY;  Surgeon: [name], MD;  Location: [facility];  Service: Gynecology;  Laterality: Bilateral;  ?  SPINAL FUSION  placed rods from T-11-L4  ? for scoliosis, spinal fusion with screws and rods  ? tummy tuck    ? UMBILICAL HERNIA REPAIR N/A 04/19/2018  ? Procedure: OPEN UMBILICAL HERNIA REPAIR;  Surgeon: Gaynelle Adu, MD;  Location: [facility];  Service: General;  Laterality: N/A;  ? WISDOM TOOTH EXTRACTION    ? ?Social History  ? ?Socioeconomic History  ? Marital status: Married  ?  Spouse name: Clovis Riley  ? Number of children: 3  ? Years of education: Not on file  ? Highest education level: Not on file  ?Occupational History  ? Not on file  ?Tobacco Use  ? Smoking status: Never  ? Smokeless  tobacco: Former  ?Vaping Use  ? Vaping Use: Never used  ?Substance and Sexual Activity  ? Alcohol use: Yes  ?  Alcohol/week: 3.0 standard drinks  ?  Types: 3 Glasses of wine per week  ?  Comment: occasional   ? Drug use: No  ? Sexual activity: Yes  ?  Birth control/protection: Surgical  ?  Comment: s/p hysterectomy  ?Other Topics Concern  ? Not on file  ?Social History Narrative  ? She is from [place], [place] but has been in [place] for 14 years. Lives at home with husband and 3 kids. Not fasting today.   ? ?Social Determinants of Health  ? ?Financial Resource Strain: Not on file  ?Food Insecurity: Not on file  ?Transportation Needs: Not on file  ?Physical Activity: Not on file  ?Stress: Not on file  ?Social Connections: Not on file  ?Intimate Partner Violence: Not on file  ? ?Family Status  ?Relation Name Status  ? Mother  Alive  ? Father  Alive  ? Brother  Alive  ? Mat Aunt  (Not Specified)  ? Mat Uncle  (Not Specified)  ? MGM  Deceased  ? MGF  Deceased  ? PGM  Alive  ? PGF  Deceased  ? Neg Hx  (Not Specified)  ? ?Family History  ?Problem Relation Age of Onset  ? Diabetes Mother   ? Thyroid disease Mother   ? Heart disease Father   ? Hyperlipidemia Father   ? Hypertension Father   ? Mental illness Brother   ? Diabetes Maternal Aunt   ? Diabetes Maternal Uncle   ? Diabetes Maternal Grandfather   ? Hyperlipidemia Paternal Grandmother   ? Early death Paternal Grandfather 93  ? Heart disease Paternal Grandfather   ? Hyperlipidemia Paternal Grandfather   ? Hypertension Paternal Grandfather   ? Anesthesia problems Neg Hx   ? Hypotension Neg Hx   ? Malignant hyperthermia Neg Hx   ? Pseudochol deficiency Neg Hx   ? ?Allergies  ?Allergen Reactions  ? Percocet [Oxycodone-Acetaminophen] Other (See Comments)  ?  hallucinations  ?  ?Patient Care Team: ?Mickie Badders, Bonna Gains, NP as PCP - General (Internal Medicine)  ? ?Medications: ?Outpatient Medications Prior to Visit  ?Medication Sig  ? acetaminophen (TYLENOL) 500 MG  tablet Take 1,000 mg by mouth every 6 (six) hours as needed for mild pain.  ? b complex vitamins capsule Take 1 capsule by mouth daily.  ? Cholecalciferol (VITAMIN D3) 125 MCG (5000 UT) CAPS Take 1 capsule (5,000 Units total) by mouth daily.  ? cyclobenzaprine (FLEXERIL) 10 MG tablet Take 1 tablet (10 mg total) by mouth 2 (two) times daily as needed for muscle spasms.  ? ibuprofen (ADVIL) 800 MG tablet Take 1 tablet (800 mg total) by mouth every 8 (eight) hours  as needed for up to 30 doses.  ? magnesium gluconate (MAGONATE) 500 MG tablet Take 1,000 mg by mouth daily.   ? polyethylene glycol powder (GLYCOLAX/MIRALAX) 17 GM/SCOOP powder Take by mouth.  ? pregabalin (LYRICA) 25 MG capsule Take 25-75 mg by mouth at bedtime.  ? [DISCONTINUED] meloxicam (MOBIC) 15 MG tablet Take 15 mg by mouth daily.  ? [DISCONTINUED] docusate sodium (COLACE) 100 MG capsule Take 1 capsule (100 mg total) by mouth 2 (two) times daily. (Patient not taking: Reported on 10/12/2021)  ? [DISCONTINUED] gabapentin (NEURONTIN) 100 MG capsule Take 100 mg by mouth daily. (Patient not taking: Reported on 08/12/2021)  ? [DISCONTINUED] Probiotic Product (PROBIOTIC-10) CAPS Take 2 capsules by mouth daily.  (Patient not taking: Reported on 08/12/2021)  ? ?No facility-administered medications prior to visit.  ? ? ?Review of Systems  ?Constitutional:  Negative for activity change, appetite change, chills, fatigue and fever.  ?HENT:  Negative for congestion and sore throat.   ?Eyes:   ?     Negative for visual changes  ?Respiratory:  Negative for cough and shortness of breath.   ?Cardiovascular:  Negative for chest pain, palpitations and leg swelling.  ?Gastrointestinal:  Negative for blood in stool, constipation and diarrhea.  ?Endocrine: Negative for cold intolerance, heat intolerance, polydipsia, polyphagia and polyuria.  ?Genitourinary:  Negative for dysuria, frequency and urgency.  ?Musculoskeletal:  Negative for myalgias.  ?Skin:  Negative for rash.   ?Neurological:  Negative for dizziness and headaches.  ?Hematological:  Does not bruise/bleed easily.  ?Psychiatric/Behavioral:  Negative for suicidal ideas. The patient is not nervous/anxious.   ? ?Last metaboli

## 2021-10-12 NOTE — Patient Instructions (Signed)
Go to lab  Preventive Care 21-35 Years Old, Female Preventive care refers to lifestyle choices and visits with your health care provider that can promote health and wellness. Preventive care visits are also called wellness exams. What can I expect for my preventive care visit? Counseling During your preventive care visit, your health care provider may ask about your: Medical history, including: Past medical problems. Family medical history. Pregnancy history. Current health, including: Menstrual cycle. Method of birth control. Emotional well-being. Home life and relationship well-being. Sexual activity and sexual health. Lifestyle, including: Alcohol, nicotine or tobacco, and drug use. Access to firearms. Diet, exercise, and sleep habits. Work and work environment. Sunscreen use. Safety issues such as seatbelt and bike helmet use. Physical exam Your health care provider may check your: Height and weight. These may be used to calculate your BMI (body mass index). BMI is a measurement that tells if you are at a healthy weight. Waist circumference. This measures the distance around your waistline. This measurement also tells if you are at a healthy weight and may help predict your risk of certain diseases, such as type 2 diabetes and high blood pressure. Heart rate and blood pressure. Body temperature. Skin for abnormal spots. What immunizations do I need?  Vaccines are usually given at various ages, according to a schedule. Your health care provider will recommend vaccines for you based on your age, medical history, and lifestyle or other factors, such as travel or where you work. What tests do I need? Screening Your health care provider may recommend screening tests for certain conditions. This may include: Pelvic exam and Pap test. Lipid and cholesterol levels. Diabetes screening. This is done by checking your blood sugar (glucose) after you have not eaten for a while  (fasting). Hepatitis B test. Hepatitis C test. HIV (human immunodeficiency virus) test. STI (sexually transmitted infection) testing, if you are at risk. BRCA-related cancer screening. This may be done if you have a family history of breast, ovarian, tubal, or peritoneal cancers. Talk with your health care provider about your test results, treatment options, and if necessary, the need for more tests. Follow these instructions at home: Eating and drinking  Eat a healthy diet that includes fresh fruits and vegetables, whole grains, lean protein, and low-fat dairy products. Take vitamin and mineral supplements as recommended by your health care provider. Do not drink alcohol if: Your health care provider tells you not to drink. You are pregnant, may be pregnant, or are planning to become pregnant. If you drink alcohol: Limit how much you have to 0-1 drink a day. Know how much alcohol is in your drink. In the [place] one drink equals one 12 oz bottle of beer (355 mL), one 5 oz glass of wine (148 mL), or one 1 oz glass of hard liquor (44 mL). Lifestyle Brush your teeth every morning and night with fluoride toothpaste. Floss one time each day. Exercise for at least 30 minutes 5 or more days each week. Do not use any products that contain nicotine or tobacco. These products include cigarettes, chewing tobacco, and vaping devices, such as e-cigarettes. If you need help quitting, ask your health care provider. Do not use drugs. If you are sexually active, practice safe sex. Use a condom or other form of protection to prevent STIs. If you do not wish to become pregnant, use a form of birth control. If you plan to become pregnant, see your health care provider for a prepregnancy visit. Find healthy ways to manage   stress, such as: Meditation, yoga, or listening to music. Journaling. Talking to a trusted person. Spending time with friends and family. Minimize exposure to UV radiation to reduce your  risk of skin cancer. Safety Always wear your seat belt while driving or riding in a vehicle. Do not drive: If you have been drinking alcohol. Do not ride with someone who has been drinking. If you have been using any mind-altering substances or drugs. While texting. When you are tired or distracted. Wear a helmet and other protective equipment during sports activities. If you have firearms in your house, make sure you follow all gun safety procedures. Seek help if you have been physically or sexually abused. What's next? Go to your health care provider once a year for an annual wellness visit. Ask your health care provider how often you should have your eyes and teeth checked. Stay up to date on all vaccines. This information is not intended to replace advice given to you by your health care provider. Make sure you discuss any questions you have with your health care provider. Document Revised: 11/18/2020 Document Reviewed: 11/18/2020 Elsevier Patient Education  2023 Elsevier Inc.  

## 2021-10-12 NOTE — Assessment & Plan Note (Signed)
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 ?RECOMMENDATION:Short-term interval follow-up bilateral breast ultrasound in 79months is recommended. Options of ultrasound-guided core biopsies and surgical excision discussed with the patient. ? ?Ordered repeat bilateral breast US ?

## 2021-10-12 NOTE — Assessment & Plan Note (Addendum)
Mother auts, uncle and MGM diagnosed in 60s. ?She requested for hgbA1c check today. ?No hx of impaired fasting glucose ?

## 2021-10-19 ENCOUNTER — Other Ambulatory Visit: Payer: Self-pay | Admitting: Nurse Practitioner

## 2021-10-19 DIAGNOSIS — N63 Unspecified lump in unspecified breast: Secondary | ICD-10-CM

## 2021-10-25 DIAGNOSIS — M5412 Radiculopathy, cervical region: Secondary | ICD-10-CM | POA: Insufficient documentation

## 2021-10-25 DIAGNOSIS — G5603 Carpal tunnel syndrome, bilateral upper limbs: Secondary | ICD-10-CM | POA: Insufficient documentation

## 2021-10-25 DIAGNOSIS — M5030 Other cervical disc degeneration, unspecified cervical region: Secondary | ICD-10-CM | POA: Insufficient documentation

## 2021-11-10 ENCOUNTER — Ambulatory Visit
Admission: RE | Admit: 2021-11-10 | Discharge: 2021-11-10 | Disposition: A | Discharge status: Home / Self Care | Source: Ambulatory Visit | Attending: Nurse Practitioner | Admitting: Nurse Practitioner

## 2021-11-10 DIAGNOSIS — N63 Unspecified lump in unspecified breast: Secondary | ICD-10-CM

## 2021-11-11 ENCOUNTER — Other Ambulatory Visit: Payer: Self-pay | Admitting: Nurse Practitioner

## 2021-11-11 DIAGNOSIS — R921 Mammographic calcification found on diagnostic imaging of breast: Secondary | ICD-10-CM

## 2022-02-15 ENCOUNTER — Encounter: Admitting: Neurology

## 2022-03-07 ENCOUNTER — Other Ambulatory Visit: Payer: Self-pay | Admitting: Nurse Practitioner

## 2022-03-07 DIAGNOSIS — N6310 Unspecified lump in the right breast, unspecified quadrant: Secondary | ICD-10-CM

## 2022-03-09 DIAGNOSIS — Z803 Family history of malignant neoplasm of breast: Secondary | ICD-10-CM | POA: Insufficient documentation

## 2022-04-07 ENCOUNTER — Encounter (HOSPITAL_BASED_OUTPATIENT_CLINIC_OR_DEPARTMENT_OTHER): Payer: Self-pay | Admitting: Emergency Medicine

## 2022-04-07 ENCOUNTER — Emergency Department (HOSPITAL_BASED_OUTPATIENT_CLINIC_OR_DEPARTMENT_OTHER): Admitting: Radiology

## 2022-04-07 ENCOUNTER — Emergency Department (HOSPITAL_BASED_OUTPATIENT_CLINIC_OR_DEPARTMENT_OTHER)
Admission: EM | Admit: 2022-04-07 | Discharge: 2022-04-07 | Disposition: A | Discharge status: Home / Self Care | Attending: Emergency Medicine | Admitting: Emergency Medicine

## 2022-04-07 ENCOUNTER — Other Ambulatory Visit: Payer: Self-pay

## 2022-04-07 ENCOUNTER — Telehealth: Payer: Self-pay | Admitting: Nurse Practitioner

## 2022-04-07 DIAGNOSIS — R0602 Shortness of breath: Secondary | ICD-10-CM | POA: Insufficient documentation

## 2022-04-07 DIAGNOSIS — R531 Weakness: Secondary | ICD-10-CM | POA: Diagnosis not present

## 2022-04-07 DIAGNOSIS — Z20822 Contact with and (suspected) exposure to covid-19: Secondary | ICD-10-CM | POA: Diagnosis not present

## 2022-04-07 DIAGNOSIS — R5383 Other fatigue: Secondary | ICD-10-CM | POA: Insufficient documentation

## 2022-04-07 LAB — D-DIMER, QUANTITATIVE: D-Dimer, Quant: 0.27 ug/mL-FEU (ref 0.00–0.50)

## 2022-04-07 LAB — BASIC METABOLIC PANEL
Anion gap: 10 (ref 5–15)
BUN: 12 mg/dL (ref 6–20)
CO2: 25 mmol/L (ref 22–32)
Calcium: 9.4 mg/dL (ref 8.9–10.3)
Chloride: 105 mmol/L (ref 98–111)
Creatinine, Ser: 0.78 mg/dL (ref 0.44–1.00)
GFR, Estimated: >60 mL/min (ref >60)
Glucose, Bld: 101 mg/dL — ABNORMAL HIGH (ref 70–99)
Potassium: 3.6 mmol/L (ref 3.5–5.1)
Sodium: 140 mmol/L (ref 135–145)

## 2022-04-07 LAB — CBC
HCT: 40.7 % (ref 36.0–46.0)
Hemoglobin: 14 g/dL (ref 12.0–15.0)
MCH: 31.3 pg (ref 26.0–34.0)
MCHC: 34.4 g/dL (ref 30.0–36.0)
MCV: 91.1 fL (ref 80.0–100.0)
Platelets: 271 10*3/uL (ref 150–400)
RBC: 4.47 MIL/uL (ref 3.87–5.11)
RDW: 12.1 % (ref 11.5–15.5)
WBC: 8.3 10*3/uL (ref 4.0–10.5)
nRBC: 0 % (ref 0.0–0.2)

## 2022-04-07 LAB — TROPONIN I (HIGH SENSITIVITY): Troponin I (High Sensitivity): <2 ng/L (ref <18)

## 2022-04-07 LAB — RESP PANEL BY RT-PCR (FLU A&B, COVID) ARPGX2
Influenza A by PCR: NEGATIVE
Influenza B by PCR: NEGATIVE
SARS Coronavirus 2 by RT PCR: NEGATIVE

## 2022-04-07 MED ORDER — ALBUTEROL SULFATE HFA 108 (90 BASE) MCG/ACT IN AERS: 2.0000 | INHALATION_SPRAY | RESPIRATORY_TRACT | 5 refills | Status: AC | PRN

## 2022-04-07 MED ORDER — PREDNISONE 10 MG PO TABS: 40.0000 mg | ORAL_TABLET | Freq: Every day | ORAL | 0 refills | Status: AC

## 2022-04-07 NOTE — ED Triage Notes (Signed)
Sob, uri Started 8 weeks ago. No longer having fevers . Much worse now Now feeling sob, with minimal exertion. Chest pain taking deep breath. Last week plane travel to [place].

## 2022-04-07 NOTE — Discharge Instructions (Signed)
You should be contacted by the heart care business and the pulmonology office within 3 business days to set up follow-up appointments with cardiology and pulmonology.  If you do not hear from them please call the number listed above.

## 2022-04-07 NOTE — ED Notes (Signed)
Rt notified

## 2022-04-07 NOTE — Telephone Encounter (Signed)
Pt says she caught something from her kids about 8 weeks ago. Ever since that time she has had a problem that is getting progressively worse. She is  getting short breathe as she speaks and this has been going on for 8 weeks. She kept coughing while we were on the phone as she talked. I tried to transfer her over to nurse triage but something happened. I called nurse triage back, they are gong to call her back

## 2022-04-07 NOTE — ED Provider Notes (Signed)
[facility] EMERGENCY DEPT Provider Note   CSN: [id number] Arrival date & time: 04/07/22  1707     History  Chief Complaint  Patient presents with   Shortness of Breath    Molly Rojas is a 36 y.o. female present emerged department generalized weakness and shortness of breath.  She reports she had an upper respiratory infection about 8 weeks ago, when her 2 kids are also sick in the house.  She feels that ever since then she has had intermittent cough, tightness in her chest, fatigue, difficulty catching her breath.  It is worsened over the past 2 nights.  She denies history of pulmonary problems, asthma, smoking or COPD.  She reports she sometimes gets some chest pressure, not necessarily with exertion, but often with coughing.  She reports a significant family history of MI and multiple female family members, including her father, uncle, grandfather, in their 32s.  She herself has never had any coronary syndrome or problems.  She denies history of hypertension, high cholesterol or diabetes.  HPI     Home Medications Prior to Admission medications   Medication Sig Start Date End Date Taking? Authorizing Provider  albuterol (VENTOLIN HFA) 108 (90 Base) MCG/ACT inhaler Inhale 2 puffs into the lungs every 4 (four) hours as needed for wheezing or shortness of breath. 04/07/22  Yes Knight Oelkers, Kermit Balo, MD  predniSONE (DELTASONE) 10 MG tablet Take 4 tablets (40 mg total) by mouth daily with breakfast for 5 days. 04/08/22 04/13/22 Yes Catherine Oak, Kermit Balo, MD  acetaminophen (TYLENOL) 500 MG tablet Take 1,000 mg by mouth every 6 (six) hours as needed for mild pain.    [provider]  b complex vitamins capsule Take 1 capsule by mouth daily.    [provider]  Cholecalciferol (VITAMIN D3) 125 MCG (5000 UT) CAPS Take 1 capsule (5,000 Units total) by mouth daily. 04/06/21   Nche, Bonna Gains, NP  cyclobenzaprine (FLEXERIL) 10 MG tablet Take 1 tablet (10 mg total) by mouth  2 (two) times daily as needed for muscle spasms. 09/05/20   Curatolo, Adam, DO  ibuprofen (ADVIL) 800 MG tablet Take 1 tablet (800 mg total) by mouth every 8 (eight) hours as needed for up to 30 doses. 09/05/20   Curatolo, Adam, DO  magnesium gluconate (MAGONATE) 500 MG tablet Take 1,000 mg by mouth daily.     [provider]  polyethylene glycol powder (GLYCOLAX/MIRALAX) 17 GM/SCOOP powder Take by mouth.    [provider]  pregabalin (LYRICA) 25 MG capsule Take 25-75 mg by mouth at bedtime. 07/12/21   [provider]      Allergies    Percocet [oxycodone-acetaminophen]    Review of Systems   Review of Systems  Physical Exam Updated Vital Signs BP 134/87 (BP Location: Right Arm)   Pulse 90   Temp 98.2 F (36.8 C) (Oral)   Resp 18   Ht 5' (1.524 m)   Wt 72.6 kg   LMP 10/23/2017   SpO2 100%   BMI 31.25 kg/m  Physical Exam Constitutional:      General: She is not in acute distress. HENT:     Head: Normocephalic and atraumatic.  Eyes:     Conjunctiva/sclera: Conjunctivae normal.     Pupils: Pupils are equal, round, and reactive to light.  Cardiovascular:     Rate and Rhythm: Normal rate and regular rhythm.  Pulmonary:     Effort: Pulmonary effort is normal. No respiratory distress.  Abdominal:  General: There is no distension.     Tenderness: There is no abdominal tenderness.  Skin:    General: Skin is warm and dry.  Neurological:     General: No focal deficit present.     Mental Status: She is alert. Mental status is at baseline.  Psychiatric:        Mood and Affect: Mood normal.        Behavior: Behavior normal.     ED Results / Procedures / Treatments   Labs (all labs ordered are listed, but only abnormal results are displayed) Labs Reviewed  BASIC METABOLIC PANEL - Abnormal; Notable for the following components:      Result Value   Glucose, Bld 101 (*)    All other components within normal limits  RESP PANEL BY RT-PCR (FLU A&B,  COVID) ARPGX2  CBC  D-DIMER, QUANTITATIVE  TROPONIN I (HIGH SENSITIVITY)    EKG EKG Interpretation  Date/Time:  Thursday April 07 2022 17:24:56 EDT Ventricular Rate:  84 PR Interval:  162 QRS Duration: 86 QT Interval:  342 QTC Calculation: 404 R Axis:   93 Text Interpretation: Normal sinus rhythm Right atrial enlargement Rightward axis Borderline ECG No previous ECGs available Confirmed by Octaviano Glow [phone]) on 04/07/2022 6:25:21 PM  Radiology DG Chest 2 View  Result Date: 04/07/2022 CLINICAL DATA:  Chest pain EXAM: CHEST - 2 VIEW COMPARISON:  Radiograph 05/01/2021 FINDINGS: Unchanged cardiomediastinal silhouette. There is no focal airspace consolidation. There is haziness of the lower lungs likely related to overlying soft tissue. There is no correlate on the lateral view. There is no pleural effusion or pneumothorax. There is no acute osseous abnormality. Thoracolumbar fusion hardware noted. IMPRESSION: No evidence of acute cardiopulmonary disease. Electronically Signed   By: Maurine Simmering M.D.   On: 04/07/2022 18:37    Procedures Procedures    Medications Ordered in ED Medications - No data to display  ED Course/ Medical Decision Making/ A&P                           Medical Decision Making Amount and/or Complexity of Data Reviewed Labs: ordered. Radiology: ordered.  Risk Prescription drug management.   This patient presents to the ED with concern for shortness of breath, fatigue. This involves an extensive number of treatment options, and is a complaint that carries with it a high risk of complications and morbidity.  The differential diagnosis includes asthma exacerbation versus pneumonia versus pneumothorax or pulmonary embolism versus anemia versus atypical ACS versus other  Co-morbidities that complicate the patient evaluation: Patient has a significant family history of coronary disease at a young age and the female family members, which is a risk factor for  cardiac disease   I ordered and personally interpreted labs.  The pertinent results include: Troponin is undetectable, with greater than 12-hour symptom onset from last chest pain; D-dimer is negative, no acute anemia, no leukocytosis, BMP within normal limits  I ordered imaging studies including x-ray of the chest I independently visualized and interpreted imaging which showed no focal infiltrate or pneumothorax I agree with the radiologist interpretation  The patient was maintained on a cardiac monitor.  I personally viewed and interpreted the cardiac monitored which showed an underlying rhythm of: Normal sinus rhythm  Per my interpretation the patient's ECG shows normal sinus rhythm no acute ischemic findings   Test Considered: With a negative D-dimer I have a lower suspicion for acute PE and did not  feel that the patient required a CT angiogram   After the interventions noted above, I reevaluated the patient and found that they have: stayed the same   Ultimately we discussed referral to pulmonology as this may be a pulmonary issue, given that it began after a viral URI.  I do not hear any wheezing to suggest that this is an asthma exacerbation, and she has been using albuterol inhaler at home for the past 2 days with no improvement.  I do not think this is sepsis or pneumonia or pneumothorax.  I also have fairly low suspicion for ACS, as this presentation is quite unusual, but with her significant family history of coronary disease, I do think it is reasonable to refer her to cardiology as well.  She is in agreement with this plan.  Hospitalization was considered, but at this time she does not have any indication for hospitalization.  She is not hypoxic.  Her work-up is unremarkable  Dispostion:  After consideration of the diagnostic results and the patients response to treatment, I feel that the patent would benefit from outpatient follow-up         Final Clinical  Impression(s) / ED Diagnoses Final diagnoses:  Shortness of breath    Rx / DC Orders ED Discharge Orders          Ordered    predniSONE (DELTASONE) 10 MG tablet  Daily with breakfast        04/07/22 1912    albuterol (VENTOLIN HFA) 108 (90 Base) MCG/ACT inhaler  Every 4 hours PRN        04/07/22 1912    Ambulatory referral to Pulmonology       Comments: Persistent dypsnea after viral URI   04/07/22 1912    Ambulatory referral to Cardiology        04/07/22 1912              Terald Sleeper, MD 04/07/22 1913

## 2022-04-07 NOTE — ED Notes (Signed)
At rest pt's respirations are normal. When talking, Pt's repirations get as high as 40. Pt c/o of intermittent pain that feels like her lungs are being ripped out of her chest.   Pt also c/o of night sweats off and on but has not had them the past 2 nights.

## 2022-04-07 NOTE — ED Notes (Signed)
Patient transported to X-ray 

## 2022-04-11 ENCOUNTER — Ambulatory Visit: Admitting: Nurse Practitioner

## 2022-05-05 ENCOUNTER — Encounter: Payer: Self-pay | Admitting: Pulmonary Disease

## 2022-05-05 ENCOUNTER — Ambulatory Visit (INDEPENDENT_AMBULATORY_CARE_PROVIDER_SITE_OTHER): Payer: 59 | Admitting: Pulmonary Disease

## 2022-05-05 VITALS — BP 112/68 | HR 67 | Ht 60.0 in | Wt 164.8 lb

## 2022-05-05 DIAGNOSIS — R062 Wheezing: Secondary | ICD-10-CM | POA: Diagnosis not present

## 2022-05-05 DIAGNOSIS — M4130 Thoracogenic scoliosis, site unspecified: Secondary | ICD-10-CM | POA: Diagnosis not present

## 2022-05-05 DIAGNOSIS — R0609 Other forms of dyspnea: Secondary | ICD-10-CM

## 2022-05-05 LAB — NITRIC OXIDE: Nitric Oxide: 9
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
## 2022-05-05 NOTE — Addendum Note (Signed)
Addended by: Maurene Capes on: 05/05/2022 09:06 AM   Modules accepted: Orders

## 2022-05-05 NOTE — Patient Instructions (Signed)
Wheezing, shortness of breath: As we discussed today this is either likely due to asthma or vocal cord dysfunction Today's exhaled nitric oxide test was normal, making the likelihood of airway inflammatory changes less likely Will get a full lung function test If the lung function test is within normal limits then we will refer you to Dr. Barnie Alderman to evaluate for vocal cord dysfunction  Scoliosis: Full pulmonary function test to assess for restrictive lung disease  Follow-up with [place] in 6 weeks or sooner if needed

## 2022-05-10 NOTE — Progress Notes (Unsigned)
No chief complaint on file.  History of Present Illness: 36 yo female with history of anemia, anxiety, GERD here today as a new consult, referred by Dr. ***, for the evaluation of dyspnea. *** Pulmonary visit ***  Primary Care Physician: Anne Ng, NP   Past Medical History:  Diagnosis Date   Abnormal Pap smear    Anemia    with pregnancy   Anxiety    GERD (gastroesophageal reflux disease)    with pregnancy    Headache(784.0)    HPV (human papilloma virus) infection    No pertinent past medical history    Peripheral vascular disease ([facility]) 03/2017   burned vein   Preterm contractions 08/17/2016   S/P cesarean section 07/22/2015   Scoliosis    Scoliosis    Umbilical hernia    Vaginal Pap smear, abnormal     Past Surgical History:  Procedure Laterality Date   ABDOMINAL HYSTERECTOMY     BACK SURGERY     BREAST ENHANCEMENT SURGERY Bilateral 06/06/2020   CESAREAN SECTION     CESAREAN SECTION  03/28/2011   Procedure: CESAREAN SECTION;  Surgeon: Juluis Mire;  Location: [facility];  Service: Gynecology;  Laterality: N/A;   CESAREAN SECTION N/A 07/22/2015   Procedure: CESAREAN SECTION;  Surgeon: Zelphia Cairo, MD;  Location: [facility];  Service: Obstetrics;  Laterality: N/A;  Repeat edc 07/28/15 NKDA   CESAREAN SECTION N/A 08/18/2016   Procedure: CESAREAN SECTION;  Surgeon: Richarda Overlie, MD;  Location: [facility];  Service: Obstetrics;  Laterality: N/A;   CYSTOSCOPY N/A 04/19/2018   Procedure: CYSTOSCOPY;  Surgeon: [name], MD;  Location: [facility];  Service: Gynecology;  Laterality: N/A;   DENTAL SURGERY     ENDOVENOUS ABLATION SAPHENOUS VEIN W/ LASER Right 06/05/2017   endovenous laser ablation R GSV and stab phlebectomy 10-20 incisions R leg by Josephina Gip MD    HERNIA REPAIR     HYSTERECTOMY ABDOMINAL WITH SALPINGO-OOPHORECTOMY Bilateral 04/19/2018   Procedure: HYSTERECTOMY ABDOMINAL WITH BILATERAL SALPINGECTOMY;  Surgeon: [name], MD;  Location: [facility];  Service: Gynecology;  Laterality: Bilateral;   SPINAL FUSION  placed rods from T-11-L4   for scoliosis, spinal fusion with screws and rods   tummy tuck     UMBILICAL HERNIA REPAIR N/A 04/19/2018   Procedure: OPEN UMBILICAL HERNIA REPAIR;  Surgeon: Gaynelle Adu, MD;  Location: [facility];  Service: General;  Laterality: N/A;   WISDOM TOOTH EXTRACTION      Current Outpatient Medications  Medication Sig Dispense Refill   acetaminophen (TYLENOL) 500 MG tablet Take 1,000 mg by mouth every 6 (six) hours as needed for mild pain.     albuterol (VENTOLIN HFA) 108 (90 Base) MCG/ACT inhaler Inhale 2 puffs into the lungs every 4 (four) hours as needed for wheezing or shortness of breath. 8 g 5   b complex vitamins capsule Take 1 capsule by mouth daily.     Cholecalciferol (VITAMIN D3) 125 MCG (5000 UT) CAPS Take 1 capsule (5,000 Units total) by mouth daily. 30 capsule    Glucosamine-Chondroitin (GLUCOSAMINE CHONDR COMPLEX PO) Take by mouth.     ibuprofen (ADVIL) 800 MG tablet Take 1 tablet (800 mg total) by mouth every 8 (eight) hours as needed for up to 30 doses. 30 tablet 0   magnesium gluconate (MAGONATE) 500 MG tablet Take 1,000 mg by mouth daily.      polyethylene glycol powder (GLYCOLAX/MIRALAX) 17 GM/SCOOP powder Take by mouth.  No current facility-administered medications for this visit.    Allergies  Allergen Reactions   Percocet [Oxycodone-Acetaminophen] Other (See Comments)    hallucinations    Social History   Socioeconomic History   Marital status: Married    Spouse name: Clovis Riley   Number of children: 3   Years of education: Not on file   Highest education level: Not on file  Occupational History   Not on file  Tobacco Use   Smoking status: Never   Smokeless tobacco: Former  Building services engineer Use: Never used  Substance and Sexual Activity   Alcohol use: Yes    Alcohol/week: 3.0 standard drinks of alcohol    Types: 3 Glasses of wine  per week    Comment: occasional    Drug use: No   Sexual activity: Yes    Birth control/protection: Surgical    Comment: s/p hysterectomy  Other Topics Concern   Not on file  Social History Narrative   She is from [place], [place] but has been in [place] for 14 years. Lives at home with husband and 3 kids. Not fasting today.    Social Determinants of Health   Financial Resource Strain: Not on file  Food Insecurity: Not on file  Transportation Needs: Not on file  Physical Activity: Not on file  Stress: Not on file  Social Connections: Not on file  Intimate Partner Violence: Not on file    Family History  Problem Relation Age of Onset   Diabetes Mother    Thyroid disease Mother    Heart disease Father    Hyperlipidemia Father    Hypertension Father    Diabetes Maternal Aunt    Diabetes Maternal Uncle    Diabetes Maternal Grandfather    Breast cancer Paternal Grandmother        unknown age   Hyperlipidemia Paternal Grandmother    Early death Paternal Grandfather 37   Heart disease Paternal Grandfather    Hyperlipidemia Paternal Grandfather    Hypertension Paternal Grandfather    Mental illness Brother    Anesthesia problems Neg Hx    Hypotension Neg Hx    Malignant hyperthermia Neg Hx    Pseudochol deficiency Neg Hx     Review of Systems:  As stated in the HPI and otherwise negative.   LMP 10/23/2017   Physical Examination: General: Well developed, well nourished, NAD  HEENT: OP clear, mucus membranes moist  SKIN: warm, dry. No rashes. Neuro: No focal deficits  Musculoskeletal: Muscle strength 5/5 all ext  Psychiatric: Mood and affect normal  Neck: No JVD, no carotid bruits, no thyromegaly, no lymphadenopathy.  Lungs:Clear bilaterally, no wheezes, rhonci, crackles Cardiovascular: Regular rate and rhythm. No murmurs, gallops or rubs. Abdomen:Soft. Bowel sounds present. Non-tender.  Extremities: No lower extremity edema. Pulses are 2 + in the bilateral  DP/PT.  EKG:  EKG {ACTION; IS/IS [id number]} ordered today. The ekg ordered today demonstrates ***  Recent Labs: 10/12/2021: ALT 16; TSH 2.04 04/07/2022: BUN 12; Creatinine, Ser 0.78; Hemoglobin 14.0; Platelets 271; Potassium 3.6; Sodium 140   Lipid Panel    Component Value Date/Time   CHOL 199 10/12/2021 0931   CHOL 194 07/05/2017 0844   TRIG 161.0 (H) 10/12/2021 0931   HDL 56.20 10/12/2021 0931   HDL 69 07/05/2017 0844   CHOLHDL 4 10/12/2021 0931   VLDL 32.2 10/12/2021 0931   LDLCALC 110 (H) 10/12/2021 0931   LDLCALC 111 (H) 07/05/2017 0844     Wt Readings from Last  3 Encounters:  05/05/22 164 lb 12.8 oz (74.8 kg)  04/07/22 160 lb (72.6 kg)  10/12/21 163 lb 12.8 oz (74.3 kg)      Assessment and Plan:   1.   Labs/ tests ordered today include:  No orders of the defined types were placed in this encounter.    Disposition:   F/U with me in ***    Signed, Verne Carrow, MD, [facility] 05/10/2022 5:13 PM    [facility] [address] [place], [place], [place]  [address] Phone: [phone]; Fax: [phone]

## 2022-05-11 ENCOUNTER — Encounter: Payer: Self-pay | Admitting: Cardiovascular Disease

## 2022-05-11 ENCOUNTER — Ambulatory Visit: Payer: 59 | Attending: Cardiovascular Disease | Admitting: Cardiovascular Disease

## 2022-05-11 VITALS — BP 116/74 | HR 59 | Ht 60.0 in | Wt 164.4 lb

## 2022-05-11 DIAGNOSIS — R0602 Shortness of breath: Secondary | ICD-10-CM

## 2022-05-11 NOTE — Patient Instructions (Signed)
Medication Instructions:  No changes *If you need a refill on your cardiac medications before your next appointment, please call your pharmacy*   Lab Work: none   Testing/Procedures: Your physician has requested that you have an echocardiogram. Echocardiography is a painless test that uses sound waves to create images of your heart. It provides your doctor with information about the size and shape of your heart and how well your heart's chambers and valves are working. This procedure takes approximately one hour. There are no restrictions for this procedure. Please do NOT wear cologne, perfume, aftershave, or lotions (deodorant is allowed). Please arrive 15 minutes prior to your appointment time.   Follow-Up: As needed   Important Information About Sugar

## 2022-05-17 ENCOUNTER — Other Ambulatory Visit: Payer: Self-pay | Admitting: Nurse Practitioner

## 2022-05-17 ENCOUNTER — Ambulatory Visit
Admission: RE | Admit: 2022-05-17 | Discharge: 2022-05-17 | Disposition: A | Discharge status: Home / Self Care | Source: Ambulatory Visit | Attending: Nurse Practitioner | Admitting: Nurse Practitioner

## 2022-05-17 ENCOUNTER — Ambulatory Visit
Admission: RE | Admit: 2022-05-17 | Discharge: 2022-05-17 | Disposition: A | Discharge status: Home / Self Care | Payer: 59 | Source: Ambulatory Visit | Attending: Nurse Practitioner | Admitting: Nurse Practitioner

## 2022-05-17 DIAGNOSIS — R921 Mammographic calcification found on diagnostic imaging of breast: Secondary | ICD-10-CM

## 2022-05-17 DIAGNOSIS — N6310 Unspecified lump in the right breast, unspecified quadrant: Secondary | ICD-10-CM

## 2022-05-23 ENCOUNTER — Other Ambulatory Visit: Payer: Self-pay | Admitting: Nurse Practitioner

## 2022-05-23 DIAGNOSIS — R921 Mammographic calcification found on diagnostic imaging of breast: Secondary | ICD-10-CM

## 2022-06-10 ENCOUNTER — Ambulatory Visit: Payer: 59 | Admitting: Pulmonary Disease

## 2022-06-13 ENCOUNTER — Other Ambulatory Visit (HOSPITAL_COMMUNITY): Payer: 59

## 2022-06-28 ENCOUNTER — Ambulatory Visit (HOSPITAL_COMMUNITY): Payer: 59 | Attending: Internal Medicine

## 2022-06-28 DIAGNOSIS — R0602 Shortness of breath: Secondary | ICD-10-CM | POA: Insufficient documentation

## 2022-06-28 LAB — ECHOCARDIOGRAM COMPLETE
Area-P 1/2: 3.65 cm2
P 1/2 time: 371 msec
S' Lateral: 2.3 cm

## 2022-07-01 ENCOUNTER — Ambulatory Visit (INDEPENDENT_AMBULATORY_CARE_PROVIDER_SITE_OTHER): Payer: 59 | Admitting: Pulmonary Disease

## 2022-07-01 ENCOUNTER — Encounter: Payer: Self-pay | Admitting: Pulmonary Disease

## 2022-07-01 VITALS — BP 106/72 | HR 74 | Temp 97.9°F | Ht 59.0 in | Wt 162.0 lb

## 2022-07-01 DIAGNOSIS — R058 Other specified cough: Secondary | ICD-10-CM | POA: Diagnosis not present

## 2022-07-01 DIAGNOSIS — M4130 Thoracogenic scoliosis, site unspecified: Secondary | ICD-10-CM

## 2022-07-01 DIAGNOSIS — R062 Wheezing: Secondary | ICD-10-CM

## 2022-07-01 DIAGNOSIS — R0609 Other forms of dyspnea: Secondary | ICD-10-CM

## 2022-07-01 LAB — PULMONARY FUNCTION TEST
DL/VA % pred: 130 %
DL/VA: 6.12 ml/min/mmHg/L
DLCO cor % pred: 115 %
DLCO cor: 21.42 ml/min/mmHg
DLCO unc % pred: 115 %
DLCO unc: 21.42 ml/min/mmHg
FEF 25-75 Post: 2.77 L/sec
FEF 25-75 Pre: 1.8 L/sec
FEF2575-%Change-Post: 54 %
FEF2575-%Pred-Post: 91 %
FEF2575-%Pred-Pre: 59 %
FEV1-%Change-Post: 10 %
FEV1-%Pred-Post: 72 %
FEV1-%Pred-Pre: 65 %
FEV1-Post: 1.93 L
FEV1-Pre: 1.75 L
FEV1FVC-%Change-Post: 6 %
FEV1FVC-%Pred-Pre: 99 %
FEV6-%Change-Post: 3 %
FEV6-%Pred-Post: 69 %
FEV6-%Pred-Pre: 67 %
FEV6-Post: 2.18 L
FEV6-Pre: 2.11 L
FEV6FVC-%Pred-Post: 101 %
FEV6FVC-%Pred-Pre: 101 %
FVC-%Change-Post: 3 %
FVC-%Pred-Post: 68 %
FVC-%Pred-Pre: 66 %
FVC-Post: 2.18 L
FVC-Pre: 2.11 L
Post FEV1/FVC ratio: 89 %
Post FEV6/FVC ratio: 100 %
Pre FEV1/FVC ratio: 83 %
Pre FEV6/FVC Ratio: 100 %
RV % pred: 110 %
RV: 1.36 L
TLC % pred: 88 %
TLC: 3.83 L

## 2022-07-01 NOTE — Progress Notes (Signed)
Synopsis: Referred in November 2023 for dyspnea in context of scoliosis and intermittent cough/wheezing.  Lung function testing was normal, no signs of asthma.  Likely has vocal cord irritation in the event of upper respiratory infections  Subjective:   PATIENT ID: Molly Rojas GENDER: female DOB: 1985/08/09, MRN: [id number]   HPI  Chief Complaint  Patient presents with   Follow-up    PFT Results.    Molly Rojas has been doing well since the last visit.  Cough has resolved.  No sinus congestion postnasal drip.  No shortness of breath no wheezing.  No mucus production.  Past Medical History:  Diagnosis Date   Abnormal Pap smear    Anemia    with pregnancy   Anxiety    GERD (gastroesophageal reflux disease)    with pregnancy    Headache(784.0)    HPV (human papilloma virus) infection    No pertinent past medical history    Peripheral vascular disease ([place]) 03/2017   burned vein   Preterm contractions 08/17/2016   S/P cesarean section 07/22/2015   Scoliosis    Scoliosis    Umbilical hernia    Vaginal Pap smear, abnormal       Review of Systems  Constitutional:  Negative for chills, fever, malaise/fatigue and weight loss.  HENT:  Negative for congestion, sinus pain and sore throat.   Respiratory:  Negative for cough, sputum production and shortness of breath.   Cardiovascular:  Negative for chest pain and leg swelling.      Objective:  Physical Exam   Vitals:   07/01/22 1607  BP: 106/72  Pulse: 74  Temp: 97.9 F (36.6 C)  TempSrc: Oral  SpO2: 94%  Weight: 162 lb (73.5 kg)  Height: 4\' 11"  (1.499 m)    Gen: well appearing HENT: OP clear, neck supple PULM: CTA B, normal effort  CV: RRR, no mgr GI: BS+, soft, nontender Derm: no cyanosis or rash Psyche: normal mood and affect    CBC    Component Value Date/Time   WBC 8.3 04/07/2022 1727   RBC 4.47 04/07/2022 1727   HGB 14.0 04/07/2022 1727   HGB 13.5 07/05/2017 0844   HCT 40.7 04/07/2022 1727   HCT  40.4 07/05/2017 0844   PLT 271 04/07/2022 1727   PLT 295 07/05/2017 0844   MCV 91.1 04/07/2022 1727   MCV 86 07/05/2017 0844   MCH 31.3 04/07/2022 1727   MCHC 34.4 04/07/2022 1727   RDW 12.1 04/07/2022 1727   RDW 14.5 07/05/2017 0844   LYMPHSABS 2.0 07/05/2017 0844   MONOABS 0.2 07/08/2008 2101   EOSABS 0.2 07/05/2017 0844   BASOSABS 0.0 07/05/2017 0844     Chest imaging: November 2023 two-view chest x-ray images independently reviewed showing significant spinal hardware in thoracic and lumbar spine, pulmonary parenchyma appears to be within normal limits without infiltrate, significant scoliosis noted  PFT: November 2023 exhaled nitric oxide 9 ppb January 2024 ratio 89%, FVC 2.18 L 68% predicted, total lung capacity 3.83 L 88% predicted, DLCO 21.42 115% predicted  Labs:  Path:  Echo:  Heart Catheterization:       Assessment & Plan:   Thoracogenic scoliosis, unspecified spinal region  Wheezing  Upper airway cough syndrome  Discussion: Molly Rojas's lung function test was completely normal.  There is no evidence of asthma or lung problem.  Even with her scoliosis there is no evidence of restrictive lung disease.  I explained to her that the most likely explanation for her recurrent cough wheezing  and mucus production is ongoing laryngeal irritation when she has respiratory infections.  I explained that this can be exacerbated by acid reflux and postnasal drip so if she has any of those symptoms in the event of a cough or cold they would need to be treated aggressively.  Also, persistent cough can lead to ongoing laryngeal inflammation which can perpetuate symptoms.  I offered referral to the voice center at [facility] with Dr. Barnie Alderman.  She says since she is doing well she would rather not do that right now.  Plan: Recurrent cough, wheezing, shortness of breath: Today's lung function test was completely normal, you do not have asthma or an underlying  lung disease The reason for your cough and other symptoms is laryngeal irritation from postnasal drip, coughing, and possibly reflux.  I recommend the following:  For sinus congestion and postnasal drip related to an upper respiratory infection like a cold: NeilMed saline rinses twice daily Sudafed over-the-counter twice daily Drink plenty of fluids Chlor-Trimeton antihistamine When severe symptoms, Afrin 2 sprays each nostril twice daily for no more than 3 to 4 days  If acid reflux: Pepcid over-the-counter twice daily Avoid fatty foods, alcohol, chocolate, caffeine and tobacco products No eating within 3 hours of bedtime  If allergic rhinitis (hayfever, seasonal allergies): Use Neil Med rinses with distilled water at least twice per day using the instructions on the package. 1/2 hour after using the [facility] Med rinse, use Nasacort two puffs in each nostril once per day.  Remember that the Nasacort can take 1-2 weeks to work after regular use. Use generic zyrtec (cetirizine) every day.  If this doesn't help, then stop taking it and use chlorpheniramine-phenylephrine combination tablets.  When you start coughing: Use Delsym over-the-counter twice daily You need to try to suppress your cough to allow your larynx (voice box) to heal.  For three days don't talk, laugh, sing, or clear your throat. Do everything you can to suppress the cough during this time. Use hard candies (sugarless Jolly Ranchers) or non-mint or non-menthol containing cough drops during this time to soothe your throat.  Use a cough suppressant (Delsym or what I have prescribed you) around the clock during this time.  After three days, gradually increase the use of your voice and back off on the cough suppressants.   We will see you back on an as-needed basis  Immunizations: Immunization History  Administered Date(s) Administered   Influenza,inj,Quad PF,6+ Mos 08/09/2017   Influenza-Unspecified 03/28/2021    PFIZER(Purple Top)SARS-COV-2 Vaccination 06/07/2019, 06/29/2019     Current Outpatient Medications:    acetaminophen (TYLENOL) 500 MG tablet, Take 1,000 mg by mouth every 6 (six) hours as needed for mild pain., Disp: , Rfl:    albuterol (VENTOLIN HFA) 108 (90 Base) MCG/ACT inhaler, Inhale 2 puffs into the lungs every 4 (four) hours as needed for wheezing or shortness of breath., Disp: 8 g, Rfl: 5   b complex vitamins capsule, Take 1 capsule by mouth daily., Disp: , Rfl:    Cholecalciferol (VITAMIN D3) 125 MCG (5000 UT) CAPS, Take 1 capsule (5,000 Units total) by mouth daily., Disp: 30 capsule, Rfl:    Glucosamine-Chondroitin (GLUCOSAMINE CHONDR COMPLEX PO), Take by mouth., Disp: , Rfl:    ibuprofen (ADVIL) 800 MG tablet, Take 1 tablet (800 mg total) by mouth every 8 (eight) hours as needed for up to 30 doses., Disp: 30 tablet, Rfl: 0   magnesium gluconate (MAGONATE) 500 MG tablet, Take 1,000 mg by  mouth daily. , Disp: , Rfl:    polyethylene glycol powder (GLYCOLAX/MIRALAX) 17 GM/SCOOP powder, Take by mouth., Disp: , Rfl:

## 2022-07-01 NOTE — Progress Notes (Signed)
PFT done today. 

## 2022-07-01 NOTE — Patient Instructions (Signed)
Recurrent cough, wheezing, shortness of breath: Today's lung function test was completely normal, you do not have asthma or an underlying lung disease The reason for your cough and other symptoms is laryngeal irritation from postnasal drip, coughing, and possibly reflux.  I recommend the following:  For sinus congestion and postnasal drip related to an upper respiratory infection like a cold: NeilMed saline rinses twice daily Sudafed over-the-counter twice daily Drink plenty of fluids Chlor-Trimeton antihistamine When severe symptoms, Afrin 2 sprays each nostril twice daily for no more than 3 to 4 days  If acid reflux: Pepcid over-the-counter twice daily Avoid fatty foods, alcohol, chocolate, caffeine and tobacco products No eating within 3 hours of bedtime  If allergic rhinitis (hayfever, seasonal allergies): Use Neil Med rinses with distilled water at least twice per day using the instructions on the package. 1/2 hour after using the [facility] Med rinse, use Nasacort two puffs in each nostril once per day.  Remember that the Nasacort can take 1-2 weeks to work after regular use. Use generic zyrtec (cetirizine) every day.  If this doesn't help, then stop taking it and use chlorpheniramine-phenylephrine combination tablets.  When you start coughing: Use Delsym over-the-counter twice daily You need to try to suppress your cough to allow your larynx (voice box) to heal.  For three days don't talk, laugh, sing, or clear your throat. Do everything you can to suppress the cough during this time. Use hard candies (sugarless Jolly Ranchers) or non-mint or non-menthol containing cough drops during this time to soothe your throat.  Use a cough suppressant (Delsym or what I have prescribed you) around the clock during this time.  After three days, gradually increase the use of your voice and back off on the cough suppressants.   We will see you back on an as-needed basis

## 2022-09-08 DIAGNOSIS — M5451 Vertebrogenic low back pain: Secondary | ICD-10-CM | POA: Diagnosis not present

## 2022-09-08 DIAGNOSIS — M50122 Cervical disc disorder at C5-C6 level with radiculopathy: Secondary | ICD-10-CM | POA: Diagnosis not present

## 2022-09-08 DIAGNOSIS — M5412 Radiculopathy, cervical region: Secondary | ICD-10-CM | POA: Diagnosis not present

## 2022-09-08 DIAGNOSIS — M419 Scoliosis, unspecified: Secondary | ICD-10-CM | POA: Diagnosis not present

## 2022-10-13 DIAGNOSIS — M4325 Fusion of spine, thoracolumbar region: Secondary | ICD-10-CM | POA: Diagnosis not present

## 2022-10-13 DIAGNOSIS — M419 Scoliosis, unspecified: Secondary | ICD-10-CM | POA: Diagnosis not present

## 2022-10-13 DIAGNOSIS — M50122 Cervical disc disorder at C5-C6 level with radiculopathy: Secondary | ICD-10-CM | POA: Diagnosis not present

## 2022-10-13 DIAGNOSIS — M5451 Vertebrogenic low back pain: Secondary | ICD-10-CM | POA: Diagnosis not present

## 2022-10-14 ENCOUNTER — Other Ambulatory Visit: Payer: Self-pay | Admitting: *Deleted

## 2022-10-14 DIAGNOSIS — M5451 Vertebrogenic low back pain: Secondary | ICD-10-CM

## 2022-10-14 DIAGNOSIS — M419 Scoliosis, unspecified: Secondary | ICD-10-CM

## 2022-10-14 DIAGNOSIS — M5416 Radiculopathy, lumbar region: Secondary | ICD-10-CM

## 2022-10-14 DIAGNOSIS — M4325 Fusion of spine, thoracolumbar region: Secondary | ICD-10-CM

## 2022-10-17 ENCOUNTER — Ambulatory Visit (INDEPENDENT_AMBULATORY_CARE_PROVIDER_SITE_OTHER): Payer: 59 | Admitting: Nurse Practitioner

## 2022-10-17 ENCOUNTER — Encounter: Payer: Self-pay | Admitting: Nurse Practitioner

## 2022-10-17 VITALS — BP 110/78 | HR 72 | Temp 98.5°F | Resp 16 | Ht 59.0 in | Wt 164.6 lb

## 2022-10-17 DIAGNOSIS — Z136 Encounter for screening for cardiovascular disorders: Secondary | ICD-10-CM

## 2022-10-17 DIAGNOSIS — Z833 Family history of diabetes mellitus: Secondary | ICD-10-CM | POA: Diagnosis not present

## 2022-10-17 DIAGNOSIS — E669 Obesity, unspecified: Secondary | ICD-10-CM

## 2022-10-17 DIAGNOSIS — Z6833 Body mass index (BMI) 33.0-33.9, adult: Secondary | ICD-10-CM

## 2022-10-17 DIAGNOSIS — Z0000 Encounter for general adult medical examination without abnormal findings: Secondary | ICD-10-CM

## 2022-10-17 DIAGNOSIS — E66811 Obesity, class 1: Secondary | ICD-10-CM | POA: Insufficient documentation

## 2022-10-17 DIAGNOSIS — Z13220 Encounter for screening for lipoid disorders: Secondary | ICD-10-CM | POA: Diagnosis not present

## 2022-10-17 LAB — COMPREHENSIVE METABOLIC PANEL
ALT: 12 U/L (ref 0–35)
AST: 17 U/L (ref 0–37)
Albumin: 4.6 g/dL (ref 3.5–5.2)
Alkaline Phosphatase: 65 U/L (ref 39–117)
BUN: 6 mg/dL (ref 6–23)
CO2: 26 mEq/L (ref 19–32)
Calcium: 9.5 mg/dL (ref 8.4–10.5)
Chloride: 105 mEq/L (ref 96–112)
Creatinine, Ser: 0.69 mg/dL (ref 0.40–1.20)
GFR: 112.11 mL/min (ref >60.00)
Glucose, Bld: 91 mg/dL (ref 70–99)
Potassium: 4.5 mEq/L (ref 3.5–5.1)
Sodium: 141 mEq/L (ref 135–145)
Total Bilirubin: 0.3 mg/dL (ref 0.2–1.2)
Total Protein: 7.1 g/dL (ref 6.0–8.3)

## 2022-10-17 LAB — LIPID PANEL
Cholesterol: 184 mg/dL (ref 0–200)
HDL: 51 mg/dL (ref >39.00)
LDL Cholesterol: 99 mg/dL (ref 0–99)
NonHDL: 132.77
Total CHOL/HDL Ratio: 4
Triglycerides: 169 mg/dL — ABNORMAL HIGH (ref 0.0–149.0)
VLDL: 33.8 mg/dL (ref 0.0–40.0)

## 2022-10-17 LAB — HEMOGLOBIN A1C: Hgb A1c MFr Bld: 5.3 % (ref 4.6–6.5)

## 2022-10-17 LAB — TSH: TSH: 2.39 u[IU]/mL (ref 0.35–5.50)

## 2022-10-17 NOTE — Progress Notes (Signed)
Complete physical exam  Patient: Molly Rojas   DOB: Apr 09, 1986   36 y.o. Female  MRN: [id number] Visit Date: 10/17/2022  Subjective:    Chief Complaint  Patient presents with   Annual Exam    Fasting   Molly Rojas is a 37 y.o. female who presents today for a complete physical exam. She reports consuming a general diet.  Walking and gym exercise 3x/week  She generally feels well. She reports sleeping well. She does not have additional problems to discuss today.  Vision:No Dental:Yes STD Screen:No   BP Readings from Last 3 Encounters:  10/17/22 110/78  07/01/22 106/72  05/11/22 116/74   Wt Readings from Last 3 Encounters:  10/17/22 164 lb 9.6 oz (74.7 kg)  07/01/22 162 lb (73.5 kg)  05/11/22 164 lb 6.4 oz (74.6 kg)   Most recent fall risk assessment:    10/17/2022    8:24 AM  Fall Risk   Falls in the past year? 0  Number falls in past yr: 0  Injury with Fall? 0  Risk for fall due to : No Fall Risks  Follow up Falls evaluation completed   Depression screen:Yes - No Depression  Most recent depression screenings:    10/17/2022    8:58 AM 08/12/2021    2:49 PM  PHQ 2/9 Scores  PHQ - 2 Score 1 0  PHQ- 9 Score 4    HPI  Class 1 obesity without serious comorbidity with body mass index (BMI) of 33.0 to 33.9 in adult Difficulty with weight loss despite heart healthy diet and daily exercise Wt Readings from Last 3 Encounters:  10/17/22 164 lb 9.6 oz (74.7 kg)  07/01/22 162 lb (73.5 kg)  05/11/22 164 lb 6.4 oz (74.6 kg)    Normal HgbA1c, lipid panel and TSH She agreed to weight management clinic referral  Family history of diabetes mellitus (DM) She requested for hgbA1c check today.  Past Medical History:  Diagnosis Date   Abnormal Pap smear    Anemia    with pregnancy   Anxiety    GERD (gastroesophageal reflux disease)    with pregnancy    Headache(784.0)    HPV (human papilloma virus) infection    No pertinent past medical history    Peripheral vascular  disease ([facility]) 03/2017   burned vein   Preterm contractions 08/17/2016   S/P cesarean section 07/22/2015   Scoliosis    Scoliosis    Umbilical hernia    Vaginal Pap smear, abnormal    Past Surgical History:  Procedure Laterality Date   ABDOMINAL HYSTERECTOMY     BACK SURGERY     BREAST ENHANCEMENT SURGERY Bilateral 06/06/2020   CESAREAN SECTION     CESAREAN SECTION  03/28/2011   Procedure: CESAREAN SECTION;  Surgeon: Juluis Mire;  Location: [facility];  Service: Gynecology;  Laterality: N/A;   CESAREAN SECTION N/A 07/22/2015   Procedure: CESAREAN SECTION;  Surgeon: Zelphia Cairo, MD;  Location: [facility];  Service: Obstetrics;  Laterality: N/A;  Repeat edc 07/28/15 NKDA   CESAREAN SECTION N/A 08/18/2016   Procedure: CESAREAN SECTION;  Surgeon: Richarda Overlie, MD;  Location: [facility];  Service: Obstetrics;  Laterality: N/A;   CYSTOSCOPY N/A 04/19/2018   Procedure: CYSTOSCOPY;  Surgeon: [name], MD;  Location: [facility];  Service: Gynecology;  Laterality: N/A;   DENTAL SURGERY     ENDOVENOUS ABLATION SAPHENOUS VEIN W/ LASER Right 06/05/2017   endovenous laser ablation R GSV and stab phlebectomy 10-20 incisions  R leg by Josephina Gip MD    HERNIA REPAIR     HYSTERECTOMY ABDOMINAL WITH SALPINGO-OOPHORECTOMY Bilateral 04/19/2018   Procedure: HYSTERECTOMY ABDOMINAL WITH BILATERAL SALPINGECTOMY;  Surgeon: [name], MD;  Location: [facility];  Service: Gynecology;  Laterality: Bilateral;   SPINAL FUSION  placed rods from T-11-L4   for scoliosis, spinal fusion with screws and rods   tummy tuck     UMBILICAL HERNIA REPAIR N/A 04/19/2018   Procedure: OPEN UMBILICAL HERNIA REPAIR;  Surgeon: Gaynelle Adu, MD;  Location: [facility];  Service: General;  Laterality: N/A;   WISDOM TOOTH EXTRACTION     Social History   Socioeconomic History   Marital status: Married    Spouse name: Clovis Riley   Number of children: 3   Years of education: Not on file   Highest education  level: Not on file  Occupational History   Occupation: Nurse at [employer]  Tobacco Use   Smoking status: Never   Smokeless tobacco: Former  Building services engineer Use: Never used  Substance and Sexual Activity   Alcohol use: Yes    Alcohol/week: 3.0 standard drinks of alcohol    Types: 3 Glasses of wine per week    Comment: occasional    Drug use: No   Sexual activity: Yes    Birth control/protection: Surgical    Comment: s/p hysterectomy  Other Topics Concern   Not on file  Social History Narrative   She is from [place], [place] but has been in [place] for 14 years. Lives at home with husband and 3 kids. Not fasting today.    Social Determinants of Health   Financial Resource Strain: Not on file  Food Insecurity: Not on file  Transportation Needs: Not on file  Physical Activity: Not on file  Stress: Not on file  Social Connections: Not on file  Intimate Partner Violence: Not on file   Family Status  Relation Name Status   Mother  Alive   Father  Alive   Mat Aunt  (Not Specified)   Mat Uncle  (Not Specified)   MGM  Deceased   MGF  Deceased   PGM  Alive   PGF  Deceased   Brother  Alive   Neg Hx  (Not Specified)   Family History  Problem Relation Age of Onset   Diabetes Mother    Thyroid disease Mother    Heart disease Father    Hyperlipidemia Father    Hypertension Father    Diabetes Maternal Aunt    Diabetes Maternal Uncle    Diabetes Maternal Grandfather    Breast cancer Paternal Grandmother        unknown age   Hyperlipidemia Paternal Grandmother    Early death Paternal Grandfather 30   Heart disease Paternal Grandfather    Hyperlipidemia Paternal Grandfather    Hypertension Paternal Grandfather    Mental illness Brother    Anesthesia problems Neg Hx    Hypotension Neg Hx    Malignant hyperthermia Neg Hx    Pseudochol deficiency Neg Hx    Allergies  Allergen Reactions   Percocet [Oxycodone-Acetaminophen] Other (See Comments)    hallucinations     Patient Care Team: Jayd Forrey, Bonna Gains, NP as PCP - General (Internal Medicine)   Medications: Outpatient Medications Prior to Visit  Medication Sig   acetaminophen (TYLENOL) 500 MG tablet Take 1,000 mg by mouth every 6 (six) hours as needed for mild pain.   albuterol (VENTOLIN HFA) 108 (90 Base) MCG/ACT  inhaler Inhale 2 puffs into the lungs every 4 (four) hours as needed for wheezing or shortness of breath.   b complex vitamins capsule Take 1 capsule by mouth daily.   Cholecalciferol (VITAMIN D3) 125 MCG (5000 UT) CAPS Take 1 capsule (5,000 Units total) by mouth daily.   Glucosamine-Chondroitin (GLUCOSAMINE CHONDR COMPLEX PO) Take by mouth.   ibuprofen (ADVIL) 800 MG tablet Take 1 tablet (800 mg total) by mouth every 8 (eight) hours as needed for up to 30 doses.   magnesium gluconate (MAGONATE) 500 MG tablet Take 1,000 mg by mouth daily.    polyethylene glycol powder (GLYCOLAX/MIRALAX) 17 GM/SCOOP powder Take by mouth.   No facility-administered medications prior to visit.    Review of Systems  Constitutional:  Negative for activity change, appetite change and unexpected weight change.  Respiratory: Negative.    Cardiovascular: Negative.   Gastrointestinal: Negative.   Endocrine: Negative for cold intolerance and heat intolerance.  Genitourinary: Negative.   Musculoskeletal: Negative.   Skin: Negative.   Neurological: Negative.   Hematological: Negative.   Psychiatric/Behavioral:  Negative for behavioral problems, decreased concentration, dysphoric mood, hallucinations, self-injury, sleep disturbance and suicidal ideas. The patient is not nervous/anxious.         Objective:  BP 110/78 (BP Location: Right Arm, Patient Position: Sitting, Cuff Size: Large)   Pulse 72   Temp 98.5 F (36.9 C) (Temporal)   Resp 16   Ht 4\' 11"  (1.499 m)   Wt 164 lb 9.6 oz (74.7 kg)   LMP 10/23/2017   SpO2 99%   BMI 33.25 kg/m     Physical Exam Vitals and nursing note reviewed.   Constitutional:      General: She is not in acute distress. HENT:     Right Ear: Tympanic membrane, ear canal and external ear normal.     Left Ear: Tympanic membrane, ear canal and external ear normal.     Nose: Nose normal.  Eyes:     Extraocular Movements: Extraocular movements intact.     Conjunctiva/sclera: Conjunctivae normal.     Pupils: Pupils are equal, round, and reactive to light.  Neck:     Thyroid: No thyroid mass, thyromegaly or thyroid tenderness.  Cardiovascular:     Rate and Rhythm: Normal rate and regular rhythm.     Pulses: Normal pulses.     Heart sounds: Normal heart sounds.  Pulmonary:     Effort: Pulmonary effort is normal.     Breath sounds: Normal breath sounds.  Abdominal:     General: Bowel sounds are normal.     Palpations: Abdomen is soft.  Musculoskeletal:        General: Normal range of motion.     Cervical back: Normal range of motion and neck supple.     Right lower leg: No edema.     Left lower leg: No edema.  Lymphadenopathy:     Cervical: No cervical adenopathy.  Skin:    General: Skin is warm and dry.  Neurological:     Mental Status: She is alert and oriented to person, place, and time.     Cranial Nerves: No cranial nerve deficit.  Psychiatric:        Mood and Affect: Mood normal.        Behavior: Behavior normal.        Thought Content: Thought content normal.      No results found for any visits on 10/17/22.    Assessment & Plan:  Routine Health Maintenance and Physical Exam  Immunization History  Administered Date(s) Administered   Influenza,inj,Quad PF,6+ Mos 08/09/2017   Influenza-Unspecified 03/28/2021   PFIZER(Purple Top)SARS-COV-2 Vaccination 06/07/2019, 06/29/2019   Tdap 08/18/2016    Health Maintenance  Topic Date Due   Hepatitis C Screening  10/17/2023 (Originally 12/16/2004)   INFLUENZA VACCINE  01/05/2023   PAP SMEAR-Modifier  09/19/2023   DTaP/Tdap/Td (2 - Td or Tdap) 08/19/2026   HIV Screening   Completed   HPV VACCINES  Aged Out   COVID-19 Vaccine  Discontinued    Discussed health benefits of physical activity, and encouraged her to engage in regular exercise appropriate for her age and condition.  Problem List Items Addressed This Visit       Other   Class 1 obesity without serious comorbidity with body mass index (BMI) of 33.0 to 33.9 in adult    Difficulty with weight loss despite heart healthy diet and daily exercise Wt Readings from Last 3 Encounters:  10/17/22 164 lb 9.6 oz (74.7 kg)  07/01/22 162 lb (73.5 kg)  05/11/22 164 lb 6.4 oz (74.6 kg)    Normal HgbA1c, lipid panel and TSH She agreed to weight management clinic referral      Relevant Orders   Amb Ref to Medical Weight Management   Family history of diabetes mellitus (DM)    She requested for hgbA1c check today.      Relevant Orders   Hemoglobin A1c   Other Visit Diagnoses     Preventative health care    -  Primary   Relevant Orders   Comprehensive metabolic panel   Lipid panel   TSH   Encounter for lipid screening for cardiovascular disease          Return in about 1 year (around 10/17/2023) for CPE (fasting).     [name], NP

## 2022-10-17 NOTE — Assessment & Plan Note (Signed)
Difficulty with weight loss despite heart healthy diet and daily exercise Wt Readings from Last 3 Encounters:  10/17/22 164 lb 9.6 oz (74.7 kg)  07/01/22 162 lb (73.5 kg)  05/11/22 164 lb 6.4 oz (74.6 kg)    Normal HgbA1c, lipid panel and TSH She agreed to weight management clinic referral

## 2022-10-17 NOTE — Assessment & Plan Note (Signed)
She requested for hgbA1c check today.

## 2022-10-17 NOTE — Patient Instructions (Signed)
Go to lab Continue Heart healthy diet and daily exercise.  Preventive Care 21-36 Years Old, Female Preventive care refers to lifestyle choices and visits with your health care provider that can promote health and wellness. Preventive care visits are also called wellness exams. What can I expect for my preventive care visit? Counseling During your preventive care visit, your health care provider may ask about your: Medical history, including: Past medical problems. Family medical history. Pregnancy history. Current health, including: Menstrual cycle. Method of birth control. Emotional well-being. Home life and relationship well-being. Sexual activity and sexual health. Lifestyle, including: Alcohol, nicotine or tobacco, and drug use. Access to firearms. Diet, exercise, and sleep habits. Work and work environment. Sunscreen use. Safety issues such as seatbelt and bike helmet use. Physical exam Your health care provider may check your: Height and weight. These may be used to calculate your BMI (body mass index). BMI is a measurement that tells if you are at a healthy weight. Waist circumference. This measures the distance around your waistline. This measurement also tells if you are at a healthy weight and may help predict your risk of certain diseases, such as type 2 diabetes and high blood pressure. Heart rate and blood pressure. Body temperature. Skin for abnormal spots. What immunizations do I need?  Vaccines are usually given at various ages, according to a schedule. Your health care provider will recommend vaccines for you based on your age, medical history, and lifestyle or other factors, such as travel or where you work. What tests do I need? Screening Your health care provider may recommend screening tests for certain conditions. This may include: Pelvic exam and Pap test. Lipid and cholesterol levels. Diabetes screening. This is done by checking your blood sugar  (glucose) after you have not eaten for a while (fasting). Hepatitis B test. Hepatitis C test. HIV (human immunodeficiency virus) test. STI (sexually transmitted infection) testing, if you are at risk. BRCA-related cancer screening. This may be done if you have a family history of breast, ovarian, tubal, or peritoneal cancers. Talk with your health care provider about your test results, treatment options, and if necessary, the need for more tests. Follow these instructions at home: Eating and drinking  Eat a healthy diet that includes fresh fruits and vegetables, whole grains, lean protein, and low-fat dairy products. Take vitamin and mineral supplements as recommended by your health care provider. Do not drink alcohol if: Your health care provider tells you not to drink. You are pregnant, may be pregnant, or are planning to become pregnant. If you drink alcohol: Limit how much you have to 0-1 drink a day. Know how much alcohol is in your drink. In the [place] one drink equals one 12 oz bottle of beer (355 mL), one 5 oz glass of wine (148 mL), or one 1 oz glass of hard liquor (44 mL). Lifestyle Brush your teeth every morning and night with fluoride toothpaste. Floss one time each day. Exercise for at least 30 minutes 5 or more days each week. Do not use any products that contain nicotine or tobacco. These products include cigarettes, chewing tobacco, and vaping devices, such as e-cigarettes. If you need help quitting, ask your health care provider. Do not use drugs. If you are sexually active, practice safe sex. Use a condom or other form of protection to prevent STIs. If you do not wish to become pregnant, use a form of birth control. If you plan to become pregnant, see your health care provider for a   prepregnancy visit. Find healthy ways to manage stress, such as: Meditation, yoga, or listening to music. Journaling. Talking to a trusted person. Spending time with friends and  family. Minimize exposure to UV radiation to reduce your risk of skin cancer. Safety Always wear your seat belt while driving or riding in a vehicle. Do not drive: If you have been drinking alcohol. Do not ride with someone who has been drinking. If you have been using any mind-altering substances or drugs. While texting. When you are tired or distracted. Wear a helmet and other protective equipment during sports activities. If you have firearms in your house, make sure you follow all gun safety procedures. Seek help if you have been physically or sexually abused. What's next? Go to your health care provider once a year for an annual wellness visit. Ask your health care provider how often you should have your eyes and teeth checked. Stay up to date on all vaccines. This information is not intended to replace advice given to you by your health care provider. Make sure you discuss any questions you have with your health care provider. Document Revised: 11/18/2020 Document Reviewed: 11/18/2020 Elsevier Patient Education  2023 [employer]  

## 2022-11-02 DIAGNOSIS — Z6831 Body mass index (BMI) 31.0-31.9, adult: Secondary | ICD-10-CM | POA: Diagnosis not present

## 2022-11-02 DIAGNOSIS — H66011 Acute suppurative otitis media with spontaneous rupture of ear drum, right ear: Secondary | ICD-10-CM | POA: Diagnosis not present

## 2022-11-23 ENCOUNTER — Other Ambulatory Visit: Payer: 59

## 2022-12-07 DIAGNOSIS — H93291 Other abnormal auditory perceptions, right ear: Secondary | ICD-10-CM | POA: Diagnosis not present

## 2022-12-07 DIAGNOSIS — H9201 Otalgia, right ear: Secondary | ICD-10-CM | POA: Diagnosis not present

## 2022-12-12 ENCOUNTER — Encounter: Payer: Self-pay | Admitting: *Deleted

## 2022-12-13 ENCOUNTER — Ambulatory Visit
Admission: RE | Admit: 2022-12-13 | Discharge: 2022-12-13 | Disposition: A | Discharge status: Home / Self Care | Payer: 59 | Source: Ambulatory Visit | Attending: *Deleted | Admitting: *Deleted

## 2022-12-13 DIAGNOSIS — M5451 Vertebrogenic low back pain: Secondary | ICD-10-CM

## 2022-12-13 DIAGNOSIS — M5416 Radiculopathy, lumbar region: Secondary | ICD-10-CM | POA: Diagnosis not present

## 2022-12-13 DIAGNOSIS — Z981 Arthrodesis status: Secondary | ICD-10-CM | POA: Diagnosis not present

## 2022-12-13 DIAGNOSIS — M4325 Fusion of spine, thoracolumbar region: Secondary | ICD-10-CM

## 2022-12-13 DIAGNOSIS — M419 Scoliosis, unspecified: Secondary | ICD-10-CM

## 2022-12-13 MED ORDER — GADOPICLENOL 0.5 MMOL/ML IV SOLN
8.0000 mL | Freq: Once | INTRAVENOUS | Status: AC | PRN
  Administered 2022-12-13: 8 mL via INTRAVENOUS

## 2022-12-21 DIAGNOSIS — M5416 Radiculopathy, lumbar region: Secondary | ICD-10-CM | POA: Diagnosis not present

## 2022-12-21 DIAGNOSIS — M50122 Cervical disc disorder at C5-C6 level with radiculopathy: Secondary | ICD-10-CM | POA: Diagnosis not present

## 2022-12-21 DIAGNOSIS — M4325 Fusion of spine, thoracolumbar region: Secondary | ICD-10-CM | POA: Diagnosis not present

## 2022-12-21 DIAGNOSIS — M419 Scoliosis, unspecified: Secondary | ICD-10-CM | POA: Diagnosis not present

## 2023-01-10 DIAGNOSIS — M5416 Radiculopathy, lumbar region: Secondary | ICD-10-CM | POA: Diagnosis not present

## 2023-02-08 ENCOUNTER — Ambulatory Visit: Admission: RE | Admit: 2023-02-08 | Payer: 59 | Source: Ambulatory Visit

## 2023-02-08 ENCOUNTER — Ambulatory Visit
Admission: RE | Admit: 2023-02-08 | Discharge: 2023-02-08 | Disposition: A | Discharge status: Home / Self Care | Payer: 59 | Source: Ambulatory Visit | Attending: Nurse Practitioner | Admitting: Nurse Practitioner

## 2023-02-08 ENCOUNTER — Other Ambulatory Visit: Payer: Self-pay | Admitting: Nurse Practitioner

## 2023-02-08 DIAGNOSIS — R921 Mammographic calcification found on diagnostic imaging of breast: Secondary | ICD-10-CM

## 2023-02-08 DIAGNOSIS — N6314 Unspecified lump in the right breast, lower inner quadrant: Secondary | ICD-10-CM | POA: Diagnosis not present

## 2023-02-08 DIAGNOSIS — N6310 Unspecified lump in the right breast, unspecified quadrant: Secondary | ICD-10-CM

## 2023-03-08 DIAGNOSIS — M47816 Spondylosis without myelopathy or radiculopathy, lumbar region: Secondary | ICD-10-CM | POA: Diagnosis not present

## 2023-03-08 DIAGNOSIS — M50122 Cervical disc disorder at C5-C6 level with radiculopathy: Secondary | ICD-10-CM | POA: Diagnosis not present

## 2023-03-08 DIAGNOSIS — M4325 Fusion of spine, thoracolumbar region: Secondary | ICD-10-CM | POA: Diagnosis not present

## 2023-03-08 DIAGNOSIS — Z6826 Body mass index (BMI) 26.0-26.9, adult: Secondary | ICD-10-CM | POA: Diagnosis not present

## 2023-03-27 DIAGNOSIS — M47816 Spondylosis without myelopathy or radiculopathy, lumbar region: Secondary | ICD-10-CM | POA: Diagnosis not present

## 2023-03-28 DIAGNOSIS — Z6830 Body mass index (BMI) 30.0-30.9, adult: Secondary | ICD-10-CM | POA: Diagnosis not present

## 2023-03-28 DIAGNOSIS — H66001 Acute suppurative otitis media without spontaneous rupture of ear drum, right ear: Secondary | ICD-10-CM | POA: Diagnosis not present

## 2023-04-07 ENCOUNTER — Emergency Department (HOSPITAL_BASED_OUTPATIENT_CLINIC_OR_DEPARTMENT_OTHER)
Admission: EM | Admit: 2023-04-07 | Discharge: 2023-04-07 | Disposition: A | Discharge status: Home / Self Care | Payer: 59 | Attending: Emergency Medicine | Admitting: Emergency Medicine

## 2023-04-07 ENCOUNTER — Other Ambulatory Visit: Payer: Self-pay

## 2023-04-07 ENCOUNTER — Emergency Department (HOSPITAL_BASED_OUTPATIENT_CLINIC_OR_DEPARTMENT_OTHER): Payer: 59

## 2023-04-07 DIAGNOSIS — M791 Myalgia, unspecified site: Secondary | ICD-10-CM | POA: Diagnosis not present

## 2023-04-07 DIAGNOSIS — B349 Viral infection, unspecified: Secondary | ICD-10-CM | POA: Insufficient documentation

## 2023-04-07 DIAGNOSIS — Z20822 Contact with and (suspected) exposure to covid-19: Secondary | ICD-10-CM | POA: Diagnosis not present

## 2023-04-07 DIAGNOSIS — R6883 Chills (without fever): Secondary | ICD-10-CM | POA: Diagnosis not present

## 2023-04-07 LAB — CBC WITH DIFFERENTIAL/PLATELET
Abs Immature Granulocytes: 0.14 10*3/uL — ABNORMAL HIGH (ref 0.00–0.07)
Basophils Absolute: 0 10*3/uL (ref 0.0–0.1)
Basophils Relative: 0 %
Eosinophils Absolute: 0 10*3/uL (ref 0.0–0.5)
Eosinophils Relative: 0 %
HCT: 37.8 % (ref 36.0–46.0)
Hemoglobin: 13.3 g/dL (ref 12.0–15.0)
Immature Granulocytes: 1 %
Lymphocytes Relative: 4 %
Lymphs Abs: 1 10*3/uL (ref 0.7–4.0)
MCH: 31.4 pg (ref 26.0–34.0)
MCHC: 35.2 g/dL (ref 30.0–36.0)
MCV: 89.2 fL (ref 80.0–100.0)
Monocytes Absolute: 1.7 10*3/uL — ABNORMAL HIGH (ref 0.1–1.0)
Monocytes Relative: 8 %
Neutro Abs: 19.1 10*3/uL — ABNORMAL HIGH (ref 1.7–7.7)
Neutrophils Relative %: 87 %
Platelets: 258 10*3/uL (ref 150–400)
RBC: 4.24 MIL/uL (ref 3.87–5.11)
RDW: 12.1 % (ref 11.5–15.5)
WBC: 22 10*3/uL — ABNORMAL HIGH (ref 4.0–10.5)
nRBC: 0 % (ref 0.0–0.2)

## 2023-04-07 LAB — URINALYSIS, ROUTINE W REFLEX MICROSCOPIC
Bilirubin Urine: NEGATIVE
Glucose, UA: NEGATIVE mg/dL
Hgb urine dipstick: NEGATIVE
Ketones, ur: NEGATIVE mg/dL
Leukocytes,Ua: NEGATIVE
Nitrite: NEGATIVE
Protein, ur: NEGATIVE mg/dL
Specific Gravity, Urine: 1.018 (ref 1.005–1.030)
pH: 5.5 (ref 5.0–8.0)

## 2023-04-07 LAB — BASIC METABOLIC PANEL
Anion gap: 9 (ref 5–15)
BUN: 9 mg/dL (ref 6–20)
CO2: 24 mmol/L (ref 22–32)
Calcium: 9.6 mg/dL (ref 8.9–10.3)
Chloride: 99 mmol/L (ref 98–111)
Creatinine, Ser: 0.67 mg/dL (ref 0.44–1.00)
GFR, Estimated: >60 mL/min (ref >60)
Glucose, Bld: 123 mg/dL — ABNORMAL HIGH (ref 70–99)
Potassium: 3.7 mmol/L (ref 3.5–5.1)
Sodium: 132 mmol/L — ABNORMAL LOW (ref 135–145)

## 2023-04-07 LAB — D-DIMER, QUANTITATIVE: D-Dimer, Quant: <0.27 ug{FEU}/mL (ref 0.00–0.50)

## 2023-04-07 LAB — RESP PANEL BY RT-PCR (RSV, FLU A&B, COVID)  RVPGX2
Influenza A by PCR: NEGATIVE
Influenza B by PCR: NEGATIVE
Resp Syncytial Virus by PCR: NEGATIVE
SARS Coronavirus 2 by RT PCR: NEGATIVE

## 2023-04-07 LAB — PREGNANCY, URINE: Preg Test, Ur: NEGATIVE

## 2023-04-07 MED ORDER — ACETAMINOPHEN 500 MG PO TABS
1000.0000 mg | ORAL_TABLET | Freq: Once | ORAL | Status: AC
  Administered 2023-04-07: 1000 mg via ORAL
  Filled 2023-04-07: qty 2

## 2023-04-07 NOTE — Discharge Instructions (Addendum)
While you are in the emergency department, you had labs done that did show an elevated white blood cell count.  Sometimes we can see this after people have been given steroids.  Your urine did not show signs of infection.

## 2023-04-07 NOTE — ED Notes (Signed)
Spoke with lab to add on d-dimer 
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
## 2023-04-07 NOTE — ED Notes (Signed)
Blood cultures x1 sent to lab with initial bloodwork

## 2023-04-07 NOTE — ED Provider Notes (Signed)
[place] EMERGENCY DEPARTMENT AT [facility] Provider Note   CSN: [id number] Arrival date & time: 04/07/23  7829     History  Chief Complaint  Patient presents with   Generalized Body Aches    Molly Rojas is a 37 y.o. female.  37 year old female here today for body aches, left-sided flank pain.  Patient also endorses pain over her left breast.  Patient recently returned from a trip to [place] to visit family.  She did fly out there.  She says that when she returned, she began to feel allover aches.  She is endorsing pain over her left breast, she says that she [place] and she believes that she may have been gripping onto the wheel very tightly nerves.        Home Medications Prior to Admission medications   Medication Sig Start Date End Date Taking? Authorizing Provider  acetaminophen (TYLENOL) 500 MG tablet Take 1,000 mg by mouth every 6 (six) hours as needed for mild pain.    [provider]  albuterol (VENTOLIN HFA) 108 (90 Base) MCG/ACT inhaler Inhale 2 puffs into the lungs every 4 (four) hours as needed for wheezing or shortness of breath. 04/07/22   Terald Sleeper, MD  b complex vitamins capsule Take 1 capsule by mouth daily.    [provider]  Cholecalciferol (VITAMIN D3) 125 MCG (5000 UT) CAPS Take 1 capsule (5,000 Units total) by mouth daily. 04/06/21   Nche, Bonna Gains, NP  Glucosamine-Chondroitin (GLUCOSAMINE CHONDR COMPLEX PO) Take by mouth.    [provider]  ibuprofen (ADVIL) 800 MG tablet Take 1 tablet (800 mg total) by mouth every 8 (eight) hours as needed for up to 30 doses. 09/05/20   Curatolo, Adam, DO  magnesium gluconate (MAGONATE) 500 MG tablet Take 1,000 mg by mouth daily.     [provider]  polyethylene glycol powder (GLYCOLAX/MIRALAX) 17 GM/SCOOP powder Take by mouth.    [provider]      Allergies    Percocet [oxycodone-acetaminophen]     Review of Systems   Review of Systems  Physical Exam Updated Vital Signs BP 112/68   Pulse (!) 121   Temp 99.8 F (37.7 C) (Oral)   Resp 18   LMP 10/23/2017   SpO2 96%  Physical Exam Vitals reviewed.  Constitutional:      Appearance: She is not ill-appearing or toxic-appearing.  HENT:     Head: Normocephalic.     Nose: Nose normal.  Cardiovascular:     Rate and Rhythm: Tachycardia present.  Pulmonary:     Effort: Pulmonary effort is normal.  Abdominal:     General: Abdomen is flat.     Palpations: Abdomen is soft.  Skin:    Comments: Left breast-nurse chaperone present.  No redness, erythema.  No abscess identified  Neurological:     Mental Status: She is alert.     ED Results / Procedures / Treatments   Labs (all labs ordered are listed, but only abnormal results are displayed) Labs Reviewed  BASIC METABOLIC PANEL - Abnormal; Notable for the following components:      Result Value   Sodium 132 (*)    Glucose, Bld 123 (*)    All other components within normal limits  CBC WITH DIFFERENTIAL/PLATELET - Abnormal; Notable for the following components:   WBC 22.0 (*)    Neutro Abs 19.1 (*)    Monocytes Absolute 1.7 (*)  Abs Immature Granulocytes 0.14 (*)    All other components within normal limits  RESP PANEL BY RT-PCR (RSV, FLU A&B, COVID)  RVPGX2  PREGNANCY, URINE  URINALYSIS, ROUTINE W REFLEX MICROSCOPIC  D-DIMER, QUANTITATIVE    EKG None  Radiology No results found.  Procedures Procedures    Medications Ordered in ED Medications  acetaminophen (TYLENOL) tablet 1,000 mg (1,000 mg Oral Given 04/07/23 0954)    ED Course/ Medical Decision Making/ A&P Clinical Course as of 04/07/23 1209  Fri Apr 07, 2023  1120 DG Chest Portable 1 View [JS]    Clinical Course User Index [JS] Arletha Pili, DO                                 Medical Decision Making 37 year old female here today for body aches.  Differential diagnoses include viral  syndrome, cystitis, pyelonephritis.  Plan-overall patient looks well.  Will obtain blood work, urinalysis on the patient.  I considered imaging on the patient, however with her well appearance, if she does have an early pyelonephritis, would not change management.  Viral swabs ordered.  If blood work and urine is unremarkable, believe this represents a viral syndrome.  Good story for developing a virus as the patient just recently returned from a cross-country flight.  Reassessment-patient's urine negative for infection.  Patient does have a leukocytosis with neutrophilic predominance.  I added on a chest x-ray was negative.  Still trying to find a source of infection on this patient, ordered a D-dimer, wondering if I missed out occult PE.  D-dimer negative.  When reassessed patient.  She overall looks well.  She was little bit tearful.  She declined to tell me what she was upset about.  I asked patient if she had any steroid use over the last couple of weeks.  She says that she did have a steroid injection 1 week ago for chronic back pain.  This could be contributing to her neutrophilic predominant leukocytosis.  Return precautions were discussed with the patient, she was agreeable with this plan.  My independent review of the patient's chest x-ray showed no pneumonia.  I considered admission for this patient for observation, however she overall looks well, is agreeable to return.  Amount and/or Complexity of Data Reviewed Labs: ordered. Radiology: ordered. Decision-making details documented in ED Course.  Risk OTC drugs.           Final Clinical Impression(s) / ED Diagnoses Final diagnoses:  Viral syndrome    Rx / DC Orders ED Discharge Orders     None         Arletha Pili, DO 04/07/23 1209

## 2023-04-09 ENCOUNTER — Ambulatory Visit
Admission: EM | Admit: 2023-04-09 | Discharge: 2023-04-09 | Disposition: A | Discharge status: Home / Self Care | Payer: 59 | Attending: Internal Medicine | Admitting: Internal Medicine

## 2023-04-09 DIAGNOSIS — N61 Mastitis without abscess: Secondary | ICD-10-CM

## 2023-04-09 MED ORDER — DOXYCYCLINE HYCLATE 100 MG PO CAPS: 100.0000 mg | ORAL_CAPSULE | Freq: Two times a day (BID) | ORAL | 0 refills | Status: DC

## 2023-04-09 NOTE — Discharge Instructions (Signed)
See your doctor in 24 to 48 hours Go to the emergency department for significant worsening of symptoms new symptoms or concerns

## 2023-04-09 NOTE — ED Triage Notes (Addendum)
Sx started Tuesday. Left breast pain. Paint thought it was a pulled muscle due to activities on vacation. Patient states that she has breast implants. She went to [facility] Friday for same issue. Patient states that she took some abx that was left over that seemed to help some She's having left ear pain also.
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
## 2023-04-10 ENCOUNTER — Other Ambulatory Visit: Payer: Self-pay | Admitting: Nurse Practitioner

## 2023-04-10 DIAGNOSIS — N6320 Unspecified lump in the left breast, unspecified quadrant: Secondary | ICD-10-CM

## 2023-04-11 ENCOUNTER — Other Ambulatory Visit: Payer: Self-pay | Admitting: Nurse Practitioner

## 2023-04-11 DIAGNOSIS — N6320 Unspecified lump in the left breast, unspecified quadrant: Secondary | ICD-10-CM

## 2023-04-12 ENCOUNTER — Ambulatory Visit (INDEPENDENT_AMBULATORY_CARE_PROVIDER_SITE_OTHER): Payer: 59 | Admitting: Nurse Practitioner

## 2023-04-12 ENCOUNTER — Encounter: Payer: Self-pay | Admitting: Nurse Practitioner

## 2023-04-12 VITALS — BP 122/72 | HR 84 | Temp 98.4°F | Resp 18 | Wt 160.6 lb

## 2023-04-12 DIAGNOSIS — Z9882 Breast implant status: Secondary | ICD-10-CM

## 2023-04-12 DIAGNOSIS — L03818 Cellulitis of other sites: Secondary | ICD-10-CM | POA: Diagnosis not present

## 2023-04-12 MED ORDER — CLINDAMYCIN HCL 300 MG PO CAPS: 300.0000 mg | ORAL_CAPSULE | Freq: Three times a day (TID) | ORAL | 0 refills | Status: DC

## 2023-04-12 NOTE — Patient Instructions (Addendum)
Stop doxycycline Start clindamycin 300mg  TID with food Use warm compress 2-3x/day Use ibuprofen 600mg  every 12hrs with food x 1week, then as needed Maintain appointment for mammogram and [place]

## 2023-04-12 NOTE — Progress Notes (Signed)
Established Patient Visit  Patient: Molly Rojas   DOB: Jul 23, 1985   37 y.o. Female  MRN: [id number] Visit Date: 04/12/2023  Subjective:    Chief Complaint  Patient presents with   Hospitalization Follow-up    PT is here for hospital follow up left breast pain and infection. PT is still having symptoms with redness and tenderness. PT is currently on antibiotic prescribed at Urgent Care.    HPI S/P bilateral breast implants Silicone, inserted 2022 She developed fever, left breast redness, swelling and pain 1week ago. This led to urgent care visit on Sunday. She started doxycycline 100mg  BID 3days ago. Fever has resolved and redness has improved. Persistent swelling and pain. She has nipple piercing inserted 67yr ago. No nipple drainage or pain or redness.  Switched doxycyline to clindamycin. Scheduled for diagnostic mammogram and [place] 04/19/2023. Advised to take ibuprofen and use warm compress F/up in 10days  Reviewed medical, surgical, and social history today  Medications: Outpatient Medications Prior to Visit  Medication Sig   acetaminophen (TYLENOL) 500 MG tablet Take 1,000 mg by mouth every 6 (six) hours as needed for mild pain.   albuterol (VENTOLIN HFA) 108 (90 Base) MCG/ACT inhaler Inhale 2 puffs into the lungs every 4 (four) hours as needed for wheezing or shortness of breath.   b complex vitamins capsule Take 1 capsule by mouth daily.   Cholecalciferol (VITAMIN D3) 125 MCG (5000 UT) CAPS Take 1 capsule (5,000 Units total) by mouth daily.   Glucosamine-Chondroitin (GLUCOSAMINE CHONDR COMPLEX PO) Take by mouth.   ibuprofen (ADVIL) 800 MG tablet Take 1 tablet (800 mg total) by mouth every 8 (eight) hours as needed for up to 30 doses.   magnesium gluconate (MAGONATE) 500 MG tablet Take 1,000 mg by mouth daily.    polyethylene glycol powder (GLYCOLAX/MIRALAX) 17 GM/SCOOP powder Take by mouth.   [DISCONTINUED] cefdinir (OMNICEF) 300 MG capsule Take 300 mg by mouth 2  (two) times daily.   [DISCONTINUED] doxycycline (VIBRAMYCIN) 100 MG capsule Take 1 capsule (100 mg total) by mouth 2 (two) times daily.   No facility-administered medications prior to visit.   Reviewed past medical and social history.   ROS per HPI above      Objective:  BP 122/72 (BP Location: Left Arm, Patient Position: Sitting, Cuff Size: Normal)   Pulse 84   Temp 98.4 F (36.9 C) (Temporal)   Resp 18   Wt 160 lb 9.6 oz (72.8 kg)   LMP 10/23/2017   SpO2 95%   BMI 32.44 kg/m      Physical Exam Vitals and nursing note reviewed.  Constitutional:      Appearance: She is not ill-appearing or toxic-appearing.  Cardiovascular:     Rate and Rhythm: Normal rate.     Pulses: Normal pulses.  Pulmonary:     Effort: Pulmonary effort is normal.  Chest:  Breasts:    Breasts are asymmetrical.     Right: Normal.     Left: Swelling, skin change and tenderness present. No bleeding, inverted nipple, mass or nipple discharge.    Lymphadenopathy:     Upper Body:     Right upper body: No supraclavicular, axillary or pectoral adenopathy.     Left upper body: No supraclavicular, axillary or pectoral adenopathy.  Skin:    Findings: Erythema present.  Neurological:     Mental Status: She is alert.     No results found for  any visits on 04/12/23.    Assessment & Plan:    Problem List Items Addressed This Visit     S/P bilateral breast implants    Silicone, inserted 2022 She developed fever, left breast redness, swelling and pain 1week ago. This led to urgent care visit on Sunday. She started doxycycline 100mg  BID 3days ago. Fever has resolved and redness has improved. Persistent swelling and pain. She has nipple piercing inserted 58yr ago. No nipple drainage or pain or redness.  Switched doxycyline to clindamycin. Scheduled for diagnostic mammogram and [place] 04/19/2023. Advised to take ibuprofen and use warm compress F/up in 10days      Other Visit Diagnoses     Cellulitis  of other specified site    -  Primary   Relevant Medications   clindamycin (CLEOCIN) 300 MG capsule      Return in about 9 days (around 04/21/2023) for cellulitis.     [name], NP

## 2023-04-12 NOTE — Assessment & Plan Note (Signed)
Silicone, inserted 2022 She developed fever, left breast redness, swelling and pain 1week ago. This led to urgent care visit on Sunday. She started doxycycline 100mg  BID 3days ago. Fever has resolved and redness has improved. Persistent swelling and pain. She has nipple piercing inserted 34yr ago. No nipple drainage or pain or redness.  Switched doxycyline to clindamycin. Scheduled for diagnostic mammogram and [place] 04/19/2023. Advised to take ibuprofen and use warm compress F/up in 10days

## 2023-04-19 ENCOUNTER — Ambulatory Visit
Admission: RE | Admit: 2023-04-19 | Discharge: 2023-04-19 | Disposition: A | Discharge status: Home / Self Care | Payer: 59 | Source: Ambulatory Visit | Attending: Nurse Practitioner | Admitting: Nurse Practitioner

## 2023-04-19 ENCOUNTER — Ambulatory Visit: Payer: 59

## 2023-04-19 ENCOUNTER — Ambulatory Visit
Admission: RE | Admit: 2023-04-19 | Discharge: 2023-04-19 | Disposition: A | Discharge status: Home / Self Care | Payer: 59 | Source: Ambulatory Visit | Attending: Nurse Practitioner

## 2023-04-19 DIAGNOSIS — N6320 Unspecified lump in the left breast, unspecified quadrant: Secondary | ICD-10-CM

## 2023-04-19 DIAGNOSIS — Z9882 Breast implant status: Secondary | ICD-10-CM | POA: Diagnosis not present

## 2023-04-19 DIAGNOSIS — N644 Mastodynia: Secondary | ICD-10-CM | POA: Diagnosis not present

## 2023-04-20 ENCOUNTER — Telehealth: Payer: Self-pay | Admitting: Nurse Practitioner

## 2023-04-20 ENCOUNTER — Other Ambulatory Visit: Payer: Self-pay | Admitting: Nurse Practitioner

## 2023-04-20 DIAGNOSIS — Z9882 Breast implant status: Secondary | ICD-10-CM

## 2023-04-20 DIAGNOSIS — N6323 Unspecified lump in the left breast, lower outer quadrant: Secondary | ICD-10-CM

## 2023-04-20 DIAGNOSIS — L03818 Cellulitis of other sites: Secondary | ICD-10-CM

## 2023-04-20 NOTE — Telephone Encounter (Signed)
Spoke to patient and offered her a video visit appointment due to her not being able to come in office due to work schedule. Patient verbalized understanding and agreed to appointment change.

## 2023-04-20 NOTE — Telephone Encounter (Signed)
Pt is asking if she can move her appt that is on Friday to after Tuesday the 19th as she has an appt with a Engineer, petroleum. She will keep Friday if you feel this is important  Please call Jojo [phone].

## 2023-04-20 NOTE — Telephone Encounter (Signed)
[facility] [phone] ex [phone]  They need for it to read w/and w/o contrast to   [id number] (Custom) - MR BREAST LEFT W CONTRAST   Because it was digitally entered it will need to be again.  Also it was entered as STAT they do not have any STAT appt available they are not booking until the end of January.

## 2023-04-20 NOTE — Telephone Encounter (Signed)
Her appt with the plastic surgeon is on tues 11/19. I can call her if you need.

## 2023-04-21 ENCOUNTER — Telehealth (INDEPENDENT_AMBULATORY_CARE_PROVIDER_SITE_OTHER): Payer: 59 | Admitting: Nurse Practitioner

## 2023-04-21 ENCOUNTER — Ambulatory Visit: Payer: 59 | Admitting: Nurse Practitioner

## 2023-04-21 ENCOUNTER — Telehealth: Payer: Self-pay

## 2023-04-21 ENCOUNTER — Encounter: Payer: Self-pay | Admitting: Nurse Practitioner

## 2023-04-21 DIAGNOSIS — L03818 Cellulitis of other sites: Secondary | ICD-10-CM

## 2023-04-21 DIAGNOSIS — T8543XD Leakage of breast prosthesis and implant, subsequent encounter: Secondary | ICD-10-CM | POA: Diagnosis not present

## 2023-04-21 DIAGNOSIS — T8543XA Leakage of breast prosthesis and implant, initial encounter: Secondary | ICD-10-CM | POA: Insufficient documentation

## 2023-04-21 NOTE — Progress Notes (Signed)
Virtual Visit via Video Note  I connected withNAME@ on 04/21/23 at  9:00 AM EST by a video enabled telemedicine application and verified that I am speaking with the correct person using two identifiers.  Location: Patient:Home Provider: Office Participants: patient and provider  I discussed the limitations of evaluation and management by telemedicine and the availability of in person appointments. I also discussed with the patient that there may be a patient responsible charge related to this service. The patient expressed understanding and agreed to proceed.  CC:1 week follow up left breast cellulitis. PT voiced she is still experiencing tenderness, swelling and hard to the touch. PT have consultation with plastic surgeon on 04/25/2023   History of Present Illness:  Breast implant rupture Resolved left breast redness but persistent swelling. She has completed clindamycin 300mg  TID x 10days Improved breast pain. She has f/up with plastic surgeon 04/25/2023 for implant removal  Observations/Objective: Physical Exam Nursing note reviewed.  Chest:  Breasts:    Breasts are asymmetrical.     Left: Swelling present. No bleeding or inverted nipple.  Neurological:     Mental Status: She is alert.     Assessment and Plan: Molly Rojas was seen today for video visit .  Diagnoses and all orders for this visit:  Cellulitis of other specified site  Breast implant rupture, subsequent encounter Comments: left   Follow Up Instructions: See instructions above   I discussed the assessment and treatment plan with the patient. The patient was provided an opportunity to ask questions and all were answered. The patient agreed with the plan and demonstrated an understanding of the instructions.   The patient was advised to call back or seek an in-person evaluation if the symptoms worsen or if the condition fails to improve as anticipated.  Molly Penna, NP

## 2023-04-21 NOTE — Assessment & Plan Note (Addendum)
Resolved left breast redness but persistent swelling. She has completed clindamycin 300mg  TID x 10days Improved breast pain. She has f/up with plastic surgeon 04/25/2023 for implant removal

## 2023-04-21 NOTE — Telephone Encounter (Signed)
Transition Care Management Follow-up Telephone Call Date of discharge and from where: [facility] 11/1 How have you been since you were released from the hospital? Patient is following up with providers  Any questions or concerns? No  Items Reviewed: Did the pt receive and understand the discharge instructions provided? Yes  Medications obtained and verified? Yes  Other? No  Any new allergies since your discharge? No  Dietary orders reviewed? No Do you have support at home? Yes    Follow up appointments reviewed:  PCP Hospital f/u appt confirmed? Yes  Scheduled to see  on  @ . Specialist Hospital f/u appt confirmed? Yes  Scheduled to see  on  @ . Are transportation arrangements needed? No  If their condition worsens, is the pt aware to call PCP or go to the Emergency Dept.? Yes Was the patient provided with contact information for the PCP's office or ED? Yes Was to pt encouraged to call back with questions or concerns? Yes

## 2023-05-15 ENCOUNTER — Ambulatory Visit: Payer: 59 | Admitting: Nurse Practitioner

## 2023-05-16 DIAGNOSIS — R92343 Mammographic extreme density, bilateral breasts: Secondary | ICD-10-CM | POA: Diagnosis not present

## 2023-05-16 DIAGNOSIS — R928 Other abnormal and inconclusive findings on diagnostic imaging of breast: Secondary | ICD-10-CM | POA: Diagnosis not present

## 2023-05-16 DIAGNOSIS — R920 Mammographic microcalcification found on diagnostic imaging of breast: Secondary | ICD-10-CM | POA: Diagnosis not present

## 2023-05-16 DIAGNOSIS — E669 Obesity, unspecified: Secondary | ICD-10-CM | POA: Diagnosis not present

## 2023-05-16 DIAGNOSIS — Z803 Family history of malignant neoplasm of breast: Secondary | ICD-10-CM | POA: Diagnosis not present

## 2023-05-16 DIAGNOSIS — N6313 Unspecified lump in the right breast, lower outer quadrant: Secondary | ICD-10-CM | POA: Diagnosis not present

## 2023-05-16 DIAGNOSIS — Z1239 Encounter for other screening for malignant neoplasm of breast: Secondary | ICD-10-CM | POA: Diagnosis not present

## 2023-05-16 DIAGNOSIS — N644 Mastodynia: Secondary | ICD-10-CM | POA: Diagnosis not present

## 2023-05-16 DIAGNOSIS — N6320 Unspecified lump in the left breast, unspecified quadrant: Secondary | ICD-10-CM | POA: Diagnosis not present

## 2023-05-19 ENCOUNTER — Other Ambulatory Visit: Payer: Self-pay | Admitting: Nurse Practitioner

## 2023-05-19 ENCOUNTER — Encounter: Payer: Self-pay | Admitting: Nurse Practitioner

## 2023-05-19 DIAGNOSIS — R921 Mammographic calcification found on diagnostic imaging of breast: Secondary | ICD-10-CM

## 2023-05-23 ENCOUNTER — Ambulatory Visit
Admission: RE | Admit: 2023-05-23 | Discharge: 2023-05-23 | Disposition: A | Discharge status: Home / Self Care | Payer: 59 | Source: Ambulatory Visit | Attending: Nurse Practitioner

## 2023-05-23 ENCOUNTER — Ambulatory Visit
Admission: RE | Admit: 2023-05-23 | Discharge: 2023-05-23 | Disposition: A | Discharge status: Home / Self Care | Payer: 59 | Source: Ambulatory Visit | Attending: Nurse Practitioner | Admitting: Nurse Practitioner

## 2023-05-23 ENCOUNTER — Inpatient Hospital Stay
Admission: RE | Admit: 2023-05-23 | Discharge: 2023-05-23 | Discharge status: Home / Self Care | Payer: 59 | Source: Ambulatory Visit | Attending: Nurse Practitioner | Admitting: Nurse Practitioner

## 2023-05-23 ENCOUNTER — Other Ambulatory Visit: Payer: Self-pay | Admitting: Nurse Practitioner

## 2023-05-23 DIAGNOSIS — N6310 Unspecified lump in the right breast, unspecified quadrant: Secondary | ICD-10-CM

## 2023-05-23 DIAGNOSIS — R921 Mammographic calcification found on diagnostic imaging of breast: Secondary | ICD-10-CM

## 2023-06-09 ENCOUNTER — Encounter: Payer: Self-pay | Admitting: Nurse Practitioner

## 2023-06-12 ENCOUNTER — Inpatient Hospital Stay: Admission: RE | Admit: 2023-06-12 | Payer: 59 | Source: Ambulatory Visit

## 2023-06-13 ENCOUNTER — Telehealth: Payer: Self-pay | Admitting: Nurse Practitioner

## 2023-06-13 DIAGNOSIS — M47816 Spondylosis without myelopathy or radiculopathy, lumbar region: Secondary | ICD-10-CM | POA: Diagnosis not present

## 2023-06-13 NOTE — Telephone Encounter (Signed)
 Copied from CRM [id number]. Topic: General - Other >> Jun 13, 2023  3:42 PM Pinkey ORN wrote: Reason for CRM: Peer To Peer Authorization >> Jun 13, 2023  3:48 PM Pinkey ORN wrote: [facility] called on behalf of patient, stating someone in the office attempted to complete a MRI breast bilateral with and without contrast that was denied (CPT Code 22950) Case # [id number]. They're wanting to try completing a peer to peer authorization with the provider to see if that'll get approved. Felisha [phone] EXT [phone]

## 2023-06-15 ENCOUNTER — Other Ambulatory Visit: Payer: Self-pay | Admitting: Nurse Practitioner

## 2023-06-15 DIAGNOSIS — N6323 Unspecified lump in the left breast, lower outer quadrant: Secondary | ICD-10-CM

## 2023-06-15 DIAGNOSIS — N6310 Unspecified lump in the right breast, unspecified quadrant: Secondary | ICD-10-CM

## 2023-06-16 ENCOUNTER — Telehealth: Payer: Self-pay

## 2023-06-16 NOTE — Telephone Encounter (Signed)
 Called patient to inform that MRI order was placed and they will call to schedule appointment. Patient didn't answer; left brief VM.

## 2023-06-16 NOTE — Telephone Encounter (Signed)
 Spoke to [facility] regarding MRI peer to peer. Patient will be notified by department to schedule appointment; new order was placed with authorization confirmation.

## 2023-06-21 ENCOUNTER — Encounter: Payer: Self-pay | Admitting: Internal Medicine

## 2023-06-21 ENCOUNTER — Ambulatory Visit: Payer: 59 | Admitting: Internal Medicine

## 2023-06-21 VITALS — BP 122/80 | HR 117 | Temp 101.2°F | Ht 59.0 in | Wt 160.6 lb

## 2023-06-21 DIAGNOSIS — R921 Mammographic calcification found on diagnostic imaging of breast: Secondary | ICD-10-CM | POA: Diagnosis not present

## 2023-06-21 DIAGNOSIS — N61 Mastitis without abscess: Secondary | ICD-10-CM

## 2023-06-21 DIAGNOSIS — Z9882 Breast implant status: Secondary | ICD-10-CM

## 2023-06-21 LAB — POC URINALSYSI DIPSTICK (AUTOMATED)
Bilirubin, UA: NEGATIVE
Blood, UA: NEGATIVE
Glucose, UA: NEGATIVE
Ketones, UA: NEGATIVE
Leukocytes, UA: NEGATIVE
Nitrite, UA: NEGATIVE
Protein, UA: NEGATIVE
Spec Grav, UA: 1.015 (ref 1.010–1.025)
Urobilinogen, UA: 0.2 U/dL
pH, UA: 7.5 (ref 5.0–8.0)

## 2023-06-21 MED ORDER — CLINDAMYCIN HCL 150 MG PO CAPS: 450.0000 mg | ORAL_CAPSULE | Freq: Three times a day (TID) | ORAL | 0 refills | Status: AC

## 2023-06-21 NOTE — Telephone Encounter (Signed)
 Spoke to [facility] and was told that MRI order is not covered with patient insurance; peer to peer and prior authorization was already complete. [place] will contact patient.

## 2023-06-21 NOTE — Telephone Encounter (Signed)
 Copied from CRM [id number]. Topic: Referral - Question >> Jun 21, 2023  9:53 AM Isabell A wrote: Reason for CRM: [place] from [facility] calling in regard to the referral she received for MRI of breast bilateral without contrast  - states its usually ordered with and without contrast. Patient stated insurance wont cover with contrast but she is willing to cover the cost. [place] would like clarification.  Callback number: [phone]

## 2023-06-21 NOTE — Addendum Note (Signed)
 Addended by: KATHEEN GARDEN L on: 06/21/2023 12:31 PM   Modules accepted: Orders

## 2023-06-21 NOTE — Progress Notes (Signed)
[facility] [address] [place] [address] Dept: [phone] Dept Fax: [phone]  Acute Care Office Visit  Subjective:   Molly Rojas 1986-03-06 06/21/2023  Chief Complaint  Patient presents with   Breast Problem    Infection of left breast     HPI: Discussed the use of AI scribe software for clinical note transcription with the patient, who gave verbal consent to proceed  Molly Rojas is a 38 year old female who complains of fever, pain, and swelling to her left breast onset 1 to 2 days ago.  Patient had bilateral breast implants placed 3 years ago without any complications.  In October 2024 her and her family went on a trip to [place] that involved strenuous activities.  After returning from their trip she developed pain in her left breast in November, along with fever chills and bodyaches.  She had initially gone to the emergency room, where lab work was conducted and patient was discharged with a diagnosis of a viral syndrome.  She presented to the urgent care 1 day following ER visit, was prescribed doxycycline for cellulitis of the left breast.  She did follow-up with her PCP who then prescribed her clindamycin which did resolve the cellulitis.  Today, patient reports reoccurrence of the swelling, pain, and warmth to her left breast after playing with her kids in the snow this weekend.  She denies letting on her stomach.  She states she was just pushing her kids in their slide down the hill.  Associated fever of 101.57F.  She has been taking Tylenol and ibuprofen for the fever.  Last dose was at 9 AM this morning.  she also reports associated chills and bodyaches.  Denies nipple discharge or drainage, chest pain, shortness of breath, abdominal pain, urinary symptoms.    To note, patient did have mammogram in September 2024 that did show calcifications in the right breast.  She has seen her plastic surgeon that did the  bilateral breast implants, which recommended a MRI of her breast to determine if there is a possible implant rupture of the left breast.  She is still working with her insurance company to get this approved.   BP Readings from Last 3 Encounters:  06/21/23 122/80  04/12/23 122/72  04/09/23 114/79    The following portions of the patient's history were reviewed and updated as appropriate: past medical history, past surgical history, family history, social history, allergies, medications, and problem list.   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   Pain in thoracic spine 10/09/2020   S/P hysterectomy 04/19/2018   Degeneration of lumbar intervertebral disc 06/27/2017   Scoliosis    HPV (human papilloma virus) infection    Anxiety    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   Umbilical hernia without obstruction and without gangrene 04/26/2016   Past Medical History:  Diagnosis Date   Abnormal Pap  smear    Anemia    with pregnancy   Anxiety    GERD (gastroesophageal reflux disease)    with pregnancy    Headache(784.0)    HPV (human papilloma virus) infection    No pertinent past medical history    Peripheral vascular disease (HCC) 03/2017   burned vein   Preterm contractions 08/17/2016   S/P cesarean section 07/22/2015   Scoliosis    Scoliosis    Umbilical hernia    Vaginal  Pap smear, abnormal    Past Surgical History:  Procedure Laterality Date   ABDOMINAL HYSTERECTOMY     BACK SURGERY     BREAST ENHANCEMENT SURGERY Bilateral 06/06/2020   CESAREAN SECTION     CESAREAN SECTION  03/28/2011   Procedure: CESAREAN SECTION;  Surgeon: Juluis Mire;  Location: [facility];  Service: Gynecology;  Laterality: N/A;   CESAREAN SECTION N/A 07/22/2015   Procedure: CESAREAN SECTION;  Surgeon: Zelphia Cairo, MD;  Location: [facility];  Service: Obstetrics;  Laterality: N/A;  Repeat edc 07/28/15 NKDA   CESAREAN SECTION N/A 08/18/2016   Procedure: CESAREAN SECTION;  Surgeon: Richarda Overlie, MD;  Location: [facility];  Service: Obstetrics;  Laterality: N/A;   CYSTOSCOPY N/A 04/19/2018   Procedure: CYSTOSCOPY;  Surgeon: [name], MD;  Location: [facility];  Service: Gynecology;  Laterality: N/A;   DENTAL SURGERY     ENDOVENOUS ABLATION SAPHENOUS VEIN W/ LASER Right 06/05/2017   endovenous laser ablation R GSV and stab phlebectomy 10-20 incisions R leg by Josephina Gip MD    HERNIA REPAIR     HYSTERECTOMY ABDOMINAL WITH SALPINGO-OOPHORECTOMY Bilateral 04/19/2018   Procedure: HYSTERECTOMY ABDOMINAL WITH BILATERAL SALPINGECTOMY;  Surgeon: [name], MD;  Location: [facility];  Service: Gynecology;  Laterality: Bilateral;   SPINAL FUSION  placed rods from T-11-L4   for scoliosis, spinal fusion with screws and rods   tummy tuck     UMBILICAL HERNIA REPAIR N/A 04/19/2018   Procedure: OPEN UMBILICAL HERNIA REPAIR;  Surgeon: Gaynelle Adu, MD;  Location: [facility];  Service: General;  Laterality: N/A;   WISDOM TOOTH EXTRACTION     Family History  Problem Relation Age of Onset   Diabetes Mother    Thyroid disease Mother    Heart disease Father    Hyperlipidemia Father    Hypertension Father    Diabetes Maternal Aunt    Diabetes Maternal Uncle    Diabetes Maternal Grandfather    Breast cancer Paternal Grandmother        unknown age   Hyperlipidemia Paternal  Grandmother    Early death Paternal Grandfather 11   Heart disease Paternal Grandfather    Hyperlipidemia Paternal Grandfather    Hypertension Paternal Grandfather    Mental illness Brother    Anesthesia problems Neg Hx    Hypotension Neg Hx    Malignant hyperthermia Neg Hx    Pseudochol deficiency Neg Hx     Current Outpatient Medications:    acetaminophen (TYLENOL) 500 MG tablet, Take 1,000 mg by mouth every 6 (six) hours as needed for mild pain., Disp: , Rfl:    b complex vitamins capsule, Take 1 capsule by mouth daily., Disp: , Rfl:    Cholecalciferol (VITAMIN D3) 125 MCG (5000 UT) CAPS, Take 1 capsule (5,000 Units total) by mouth daily., Disp: 30 capsule, Rfl:    clindamycin (CLEOCIN) 150 MG capsule, Take 3 capsules (450 mg total) by mouth 3 (three) times daily for 7 days., Disp: 63 capsule, Rfl:  0   ibuprofen (ADVIL) 800 MG tablet, Take 1 tablet (800 mg total) by mouth every 8 (eight) hours as needed for up to 30 doses., Disp: 30 tablet, Rfl: 0   magnesium gluconate (MAGONATE) 500 MG tablet, Take 1,000 mg by mouth daily. , Disp: , Rfl:    polyethylene glycol powder (GLYCOLAX/MIRALAX) 17 GM/SCOOP powder, Take by mouth., Disp: , Rfl:    albuterol (VENTOLIN HFA) 108 (90 Base) MCG/ACT inhaler, Inhale 2 puffs into the lungs every 4 (four) hours as needed for wheezing or shortness of breath. (Patient not taking: Reported on 06/21/2023), Disp: 8 g, Rfl: 5   Glucosamine-Chondroitin (GLUCOSAMINE CHONDR COMPLEX PO), Take by mouth. (Patient not taking: Reported on 06/21/2023), Disp: , Rfl:  Allergies  Allergen Reactions   Percocet [Oxycodone-Acetaminophen] Other (See Comments)    hallucinations     ROS: A complete ROS was performed with pertinent positives/negatives noted in the HPI. The remainder of the ROS are negative.    Objective:   Today's Vitals   06/21/23 1523 06/21/23 1606  BP: 122/80   Pulse: (!) 117 (!) 117  Temp: (!) 101.2 F (38.4 C)   TempSrc: Temporal   SpO2: 97%    Weight: 160 lb 9.6 oz (72.8 kg)   Height: 4\' 11"  (1.499 m)     GENERAL: ill-appearing, non-toxic, in NAD. Well nourished.  SKIN: Pink, warm and dry. No rash, lesion, ulceration, or ecchymoses.  NECK: Trachea midline. Full ROM w/o pain or tenderness. No lymphadenopathy.  BREASTS: Left breast is firm to palpation, specifically firm below nipple at left lower outer quadrant.  Warm to the touch.  Redness to medial aspect of left breast at the 6 to 8 o'clock position.  Nipple everted.  No nipple discharge upon palpation.  Right breast pendulous, no palpable masses, no nipple discharge.   RESPIRATORY: Chest wall symmetrical. Respirations even and non-labored. Breath sounds clear to auscultation bilaterally.  CARDIAC: S1, S2 present, tachycardia. Peripheral pulses 2+ bilaterally.  EXTREMITIES: Without clubbing, cyanosis, or edema.  NEUROLOGIC: No motor or sensory deficits. Steady, even gait.  PSYCH/MENTAL STATUS: Alert, oriented x 3. Cooperative, appropriate mood and affect.    Results for orders placed or performed in visit on 06/21/23  POCT Urinalysis Dipstick (Automated)  Result Value Ref Range   Color, UA yellow    Clarity, UA clear    Glucose, UA Negative Negative   Bilirubin, UA neg    Ketones, UA neg    Spec Grav, UA 1.015 1.010 - 1.025   Blood, UA neg    pH, UA 7.5 5.0 - 8.0   Protein, UA Negative Negative   Urobilinogen, UA 0.2 0.2 or 1.0 E.U./dL   Nitrite, UA neg    Leukocytes, UA Negative Negative      Assessment & Plan:  Assessment and Plan   Breast Implant Complication and Cellulitis of Left Breast Left breast redness, swelling, firmness, and pain with systemic symptoms (fever, chills, body aches). History of recent strenuous activity. Concern for possible implant rupture or infection. Multiple providers have recommended MRI with contrast for definitive diagnosis, but insurance approval pending. -Start Clindamycin 450mg  TID x 7 days -Order STAT blood work including CBC,  CMP, lactic acid level to assess for systemic infection/sepsis. -Collect urine sample for urinalysis. -Continue to pursue insurance approval for MRI with contrast for both breasts.  Calcification in Right Breast Under surveillance with ultrasounds.  -Continue to pursue insurance approval for MRI with contrast for both breasts.  Follow-up Await lab results. If  white blood cell count significantly elevated or other concerning lab findings, may need to send patient to the emergency room for further evaluation and management.     Meds ordered this encounter  Medications   clindamycin (CLEOCIN) 150 MG capsule    Sig: Take 3 capsules (450 mg total) by mouth 3 (three) times daily for 7 days.    Dispense:  63 capsule    Refill:  0    Supervising Provider:   Garnette Gunner [[id number]]   Orders Placed This Encounter  Procedures   Lactic acid, plasma   CBC with Differential/Platelet   Comp Met (CMET)   POCT Urinalysis Dipstick (Automated)   Lab Orders         Lactic acid, plasma         CBC with Differential/Platelet         Comp Met (CMET)         POCT Urinalysis Dipstick (Automated)     No images are attached to the encounter or orders placed in the encounter.   Salvatore Decent, FNP

## 2023-06-21 NOTE — Patient Instructions (Signed)
 I will be in contact with you for lab results

## 2023-06-22 ENCOUNTER — Encounter: Payer: Self-pay | Admitting: Internal Medicine

## 2023-06-22 LAB — COMPREHENSIVE METABOLIC PANEL
ALT: 17 U/L (ref 0–35)
AST: 16 U/L (ref 0–37)
Albumin: 4.6 g/dL (ref 3.5–5.2)
Alkaline Phosphatase: 59 U/L (ref 39–117)
BUN: 11 mg/dL (ref 6–23)
CO2: 27 meq/L (ref 19–32)
Calcium: 9.1 mg/dL (ref 8.4–10.5)
Chloride: 101 meq/L (ref 96–112)
Creatinine, Ser: 0.67 mg/dL (ref 0.40–1.20)
GFR: 112.37 mL/min (ref >60.00)
Glucose, Bld: 117 mg/dL — ABNORMAL HIGH (ref 70–99)
Potassium: 3.9 meq/L (ref 3.5–5.1)
Sodium: 135 meq/L (ref 135–145)
Total Bilirubin: 0.7 mg/dL (ref 0.2–1.2)
Total Protein: 7.3 g/dL (ref 6.0–8.3)

## 2023-06-22 LAB — CBC WITH DIFFERENTIAL/PLATELET
Basophils Absolute: 0.1 10*3/uL (ref 0.0–0.1)
Basophils Relative: 0.4 % (ref 0.0–3.0)
Eosinophils Absolute: 0 10*3/uL (ref 0.0–0.7)
Eosinophils Relative: 0.2 % (ref 0.0–5.0)
HCT: 41.6 % (ref 36.0–46.0)
Hemoglobin: 13.8 g/dL (ref 12.0–15.0)
Lymphocytes Relative: 5 % — ABNORMAL LOW (ref 12.0–46.0)
Lymphs Abs: 0.9 10*3/uL (ref 0.7–4.0)
MCHC: 33.1 g/dL (ref 30.0–36.0)
MCV: 92.9 fL (ref 78.0–100.0)
Monocytes Absolute: 1.2 10*3/uL — ABNORMAL HIGH (ref 0.1–1.0)
Monocytes Relative: 6.7 % (ref 3.0–12.0)
Neutro Abs: 15.3 10*3/uL — ABNORMAL HIGH (ref 1.4–7.7)
Neutrophils Relative %: 87.7 % — ABNORMAL HIGH (ref 43.0–77.0)
Platelets: 308 10*3/uL (ref 150.0–400.0)
RBC: 4.47 Mil/uL (ref 3.87–5.11)
RDW: 13.2 % (ref 11.5–15.5)
WBC: 17.4 10*3/uL — ABNORMAL HIGH (ref 4.0–10.5)

## 2023-06-23 LAB — LACTIC ACID, PLASMA: LACTIC ACID: 1.6 mmol/L (ref 0.4–1.8)

## 2023-06-23 NOTE — Telephone Encounter (Signed)
Noted  

## 2023-06-26 ENCOUNTER — Encounter: Payer: Self-pay | Admitting: Internal Medicine

## 2023-06-26 ENCOUNTER — Ambulatory Visit (INDEPENDENT_AMBULATORY_CARE_PROVIDER_SITE_OTHER): Payer: 59 | Admitting: Internal Medicine

## 2023-06-26 VITALS — BP 100/60 | HR 70 | Temp 98.1°F | Ht 59.0 in | Wt 161.0 lb

## 2023-06-26 DIAGNOSIS — N61 Mastitis without abscess: Secondary | ICD-10-CM

## 2023-06-26 LAB — CBC WITH DIFFERENTIAL/PLATELET
Basophils Absolute: 0.1 10*3/uL (ref 0.0–0.1)
Basophils Relative: 0.8 % (ref 0.0–3.0)
Eosinophils Absolute: 0.3 10*3/uL (ref 0.0–0.7)
Eosinophils Relative: 3.4 % (ref 0.0–5.0)
HCT: 37.7 % (ref 36.0–46.0)
Hemoglobin: 12.6 g/dL (ref 12.0–15.0)
Lymphocytes Relative: 26.2 % (ref 12.0–46.0)
Lymphs Abs: 2.1 10*3/uL (ref 0.7–4.0)
MCHC: 33.5 g/dL (ref 30.0–36.0)
MCV: 91.6 fL (ref 78.0–100.0)
Monocytes Absolute: 0.8 10*3/uL (ref 0.1–1.0)
Monocytes Relative: 9.4 % (ref 3.0–12.0)
Neutro Abs: 4.9 10*3/uL (ref 1.4–7.7)
Neutrophils Relative %: 60.2 % (ref 43.0–77.0)
Platelets: 403 10*3/uL — ABNORMAL HIGH (ref 150.0–400.0)
RBC: 4.11 Mil/uL (ref 3.87–5.11)
RDW: 12.9 % (ref 11.5–15.5)
WBC: 8.1 10*3/uL (ref 4.0–10.5)

## 2023-06-26 NOTE — Progress Notes (Signed)
[facility] [address] [place] [address] Dept: [phone] Dept Fax: [phone]  Acute Care Office Visit  Subjective:   Molly Rojas 04/21/1986 06/26/2023  Chief Complaint  Patient presents with   Follow-up    HPI: Discussed the use of AI scribe software for clinical note transcription with the patient, who gave verbal consent to proceed.  History of Present Illness   The patient, with a history of cellulitis in the left breast, presents for follow up. She reports that the condition worsened before starting to improve. The patient describes the affected area as initially "bright red and angry," but notes that the redness and fever have since subsided. Despite the improvement, the patient reports that the breast remains swollen and firm, causing discomfort and pain. The patient also notes that the swelling seems to shift depending on her sleeping position. The patient has been taking clindamycin 450mg  TID and applying warm compresses to manage the condition. The patient has a history of a similar episode that resolved over two months. The patient also has a breast implant and there is concern for possible rupture. The patient's insurance has denied an MRI with contrast, which is necessary to evaluate the condition of the implant.       The following portions of the patient's history were reviewed and updated as appropriate: past medical history, past surgical history, family history, social history, allergies, medications, and problem list.   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   Pain in thoracic spine 10/09/2020   S/P hysterectomy 04/19/2018   Degeneration of lumbar intervertebral disc 06/27/2017   Scoliosis    HPV (human papilloma virus) infection    Anxiety    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   Umbilical hernia without obstruction and without gangrene 04/26/2016   Past Medical History:  Diagnosis Date   Abnormal Pap smear    Anemia    with pregnancy   Anxiety    GERD (gastroesophageal reflux disease)    with pregnancy    Headache(784.0)    HPV (human papilloma virus) infection    No pertinent past medical history    Peripheral vascular disease (HCC) 03/2017   burned vein   Preterm contractions 08/17/2016   S/P cesarean section 07/22/2015   Scoliosis    Scoliosis    Umbilical hernia    Vaginal Pap smear, abnormal    Past Surgical History:  Procedure Laterality Date   ABDOMINAL HYSTERECTOMY     BACK SURGERY     BREAST ENHANCEMENT SURGERY Bilateral 06/06/2020   CESAREAN SECTION     CESAREAN SECTION  03/28/2011   Procedure: CESAREAN SECTION;  Surgeon: Juluis Mire;  Location: [facility];  Service: Gynecology;  Laterality: N/A;   CESAREAN SECTION N/A 07/22/2015   Procedure: CESAREAN SECTION;  Surgeon: Zelphia Cairo, MD;  Location: [facility];  Service: Obstetrics;  Laterality: N/A;  Repeat edc 07/28/15 NKDA   CESAREAN SECTION N/A 08/18/2016   Procedure: CESAREAN SECTION;  Surgeon: Richarda Overlie, MD;  Location: [facility];  Service: Obstetrics;  Laterality: N/A;   CYSTOSCOPY N/A 04/19/2018   Procedure: CYSTOSCOPY;  Surgeon: [name], MD;  Location: [facility];  Service: Gynecology;  Laterality: N/A;   DENTAL SURGERY     ENDOVENOUS ABLATION SAPHENOUS VEIN W/  LASER Right 06/05/2017   endovenous laser ablation R GSV and stab phlebectomy 10-20 incisions R leg by Josephina Gip MD    HERNIA REPAIR     HYSTERECTOMY ABDOMINAL WITH SALPINGO-OOPHORECTOMY Bilateral 04/19/2018   Procedure: HYSTERECTOMY ABDOMINAL WITH BILATERAL SALPINGECTOMY;  Surgeon: [name], MD;  Location: [facility];  Service: Gynecology;  Laterality: Bilateral;   SPINAL FUSION  placed rods from T-11-L4   for scoliosis, spinal fusion with screws and rods   tummy tuck     UMBILICAL HERNIA REPAIR N/A 04/19/2018   Procedure: OPEN UMBILICAL HERNIA REPAIR;  Surgeon: Gaynelle Adu, MD;  Location: [facility];  Service: General;  Laterality: N/A;   WISDOM TOOTH EXTRACTION     Family History  Problem Relation Age of Onset   Diabetes Mother    Thyroid disease Mother    Heart disease Father    Hyperlipidemia Father    Hypertension Father    Diabetes Maternal Aunt    Diabetes Maternal Uncle    Diabetes Maternal Grandfather    Breast cancer Paternal Grandmother        unknown age   Hyperlipidemia Paternal Grandmother    Early death Paternal Grandfather 42   Heart disease Paternal Grandfather    Hyperlipidemia Paternal Grandfather    Hypertension Paternal Grandfather    Mental illness Brother    Anesthesia problems Neg Hx    Hypotension Neg Hx    Malignant hyperthermia Neg Hx    Pseudochol deficiency Neg Hx     Current Outpatient Medications:    acetaminophen (TYLENOL) 500 MG tablet, Take 1,000 mg by mouth every 6 (six) hours as needed for mild pain., Disp: , Rfl:    albuterol (VENTOLIN HFA) 108 (90 Base) MCG/ACT inhaler, Inhale 2 puffs into the lungs every 4 (four) hours as needed for wheezing or shortness of breath., Disp: 8 g, Rfl: 5   b complex vitamins capsule, Take 1 capsule by mouth daily., Disp: , Rfl:    Cholecalciferol (VITAMIN D3) 125 MCG (5000 UT) CAPS, Take 1 capsule (5,000 Units total) by mouth daily., Disp: 30 capsule, Rfl:    clindamycin (CLEOCIN) 150 MG capsule,  Take 3 capsules (450 mg total) by mouth 3 (three) times daily for 7 days., Disp: 63 capsule, Rfl: 0   Glucosamine-Chondroitin (GLUCOSAMINE CHONDR COMPLEX PO), Take by mouth., Disp: , Rfl:    ibuprofen (ADVIL) 800 MG tablet, Take 1 tablet (800 mg total) by mouth every 8 (eight) hours as needed for up to 30 doses., Disp: 30 tablet, Rfl: 0   magnesium gluconate (MAGONATE) 500 MG tablet, Take 1,000 mg by mouth daily. , Disp: , Rfl:    polyethylene glycol powder (GLYCOLAX/MIRALAX) 17 GM/SCOOP powder, Take by mouth., Disp: , Rfl:  Allergies  Allergen Reactions   Percocet [Oxycodone-Acetaminophen] Other (See Comments)    hallucinations     ROS: A complete ROS was performed with pertinent positives/negatives noted in the HPI. The remainder of the ROS are negative.    Objective:   Today's Vitals   06/26/23 1305  BP: 100/60  Pulse: 70  Temp: 98.1 F (  36.7 C)  TempSrc: Temporal  SpO2: 99%  Weight: 161 lb (73 kg)  Height: 4\' 11"  (1.499 m)    GENERAL: Well-appearing, in NAD. Well nourished.  SKIN: Pink, warm and dry.  RESPIRATORY:  Respirations even and non-labored.  CARDIAC: S1, S2 present, regular rate and rhythm. Peripheral pulses 2+ bilaterally.  BREASTS: Left breast firm, swollen. No erythema or redness.  Nipples everted and w/o discharge. No rash or skin retraction. No axillary or supraclavicular lymphadenopathy.  EXTREMITIES: Without clubbing, cyanosis, or edema.  NEUROLOGIC: Steady, even gait.  PSYCH/MENTAL STATUS: Alert, oriented x 3. Cooperative, appropriate mood and affect.    No results found for any visits on 06/26/23.    Assessment & Plan:  Assessment and Plan    Cellulitis of the Left Breast Improved with Clindamycin, but persistent swelling and pain. Concern for underlying breast implant rupture. -Continue Clindamycin 450mg  TID until completion of 7-day course. -Check white blood cell count today to ensure improvement. -Follow up with plastic surgeon in 1-2 weeks to  evaluate persistent swelling and possible implant rupture. -PCP to [facility] for MRI with contrast through insurance peer-to-peer review to evaluate for possible implant rupture.     Orders Placed This Encounter  Procedures   CBC with Differential/Platelet   Lab Orders         CBC with Differential/Platelet     No images are attached to the encounter or orders placed in the encounter.  Return for Scheduled Routine Office Visits and as needed.   Salvatore Decent, FNP

## 2023-07-13 DIAGNOSIS — M4325 Fusion of spine, thoracolumbar region: Secondary | ICD-10-CM | POA: Diagnosis not present

## 2023-07-13 DIAGNOSIS — M47816 Spondylosis without myelopathy or radiculopathy, lumbar region: Secondary | ICD-10-CM | POA: Diagnosis not present

## 2023-08-08 ENCOUNTER — Other Ambulatory Visit: Payer: 59

## 2023-09-07 NOTE — Progress Notes (Unsigned)
 [facility] [address] [place] [address] Dept: [phone] Dept Fax: [phone]  Acute Care Office Visit  Subjective:   Molly Rojas [birth year] 09/08/2023  No chief complaint on file.   HPI: Discussed the use of AI scribe software for clinical note transcription with the patient, who gave verbal consent to proceed.  History of Present Illness      The following portions of the patient's history were reviewed and updated as appropriate: past medical history, past surgical history, family history, social history, allergies, medications, and problem list.   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   Pain in thoracic spine 10/09/2020   S/P hysterectomy 04/19/2018   Degeneration of lumbar intervertebral disc 06/27/2017   Scoliosis    HPV (human papilloma virus) infection    Anxiety    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   Umbilical hernia without obstruction and without gangrene 04/26/2016   Past Medical History:  Diagnosis Date   Abnormal Pap smear    Anemia    with pregnancy   Anxiety    GERD (gastroesophageal reflux disease)    with pregnancy    Headache(784.0)    HPV (human papilloma virus) infection    No pertinent past medical history    Peripheral vascular disease (HCC) 03/2017   burned vein    Preterm contractions 08/17/2016   S/P cesarean section 07/22/2015   Scoliosis    Scoliosis    Umbilical hernia    Vaginal Pap smear, abnormal    Past Surgical History:  Procedure Laterality Date   ABDOMINAL HYSTERECTOMY     BACK SURGERY     BREAST ENHANCEMENT SURGERY Bilateral 06/06/2020   CESAREAN SECTION     CESAREAN SECTION  03/28/2011   Procedure: CESAREAN SECTION;  Surgeon: Juluis Mire;  Location: [facility];  Service: Gynecology;  Laterality: N/A;   CESAREAN SECTION N/A 07/22/2015   Procedure: CESAREAN SECTION;  Surgeon: Zelphia Cairo, MD;  Location: [facility];  Service: Obstetrics;  Laterality: N/A;  Repeat edc 07/28/15 NKDA   CESAREAN SECTION N/A 08/18/2016   Procedure: CESAREAN SECTION;  Surgeon: Richarda Overlie, MD;  Location: [facility];  Service: Obstetrics;  Laterality: N/A;   CYSTOSCOPY N/A 04/19/2018   Procedure: CYSTOSCOPY;  Surgeon: [name], MD;  Location: [facility];  Service: Gynecology;  Laterality: N/A;   DENTAL SURGERY     ENDOVENOUS ABLATION SAPHENOUS VEIN W/ LASER Right 06/05/2017   endovenous laser ablation R GSV and stab phlebectomy 10-20 incisions R leg by Josephina Gip MD    HERNIA REPAIR     HYSTERECTOMY ABDOMINAL WITH SALPINGO-OOPHORECTOMY Bilateral 04/19/2018   Procedure: HYSTERECTOMY ABDOMINAL WITH BILATERAL SALPINGECTOMY;  Surgeon: [name], MD;  Location: [facility];  Service: Gynecology;  Laterality: Bilateral;   SPINAL FUSION  placed rods from T-11-L4   for scoliosis, spinal fusion with screws and rods  tummy tuck     UMBILICAL HERNIA REPAIR N/A 04/19/2018   Procedure: OPEN UMBILICAL HERNIA REPAIR;  Surgeon: Gaynelle Adu, MD;  Location: [facility];  Service: General;  Laterality: N/A;   WISDOM TOOTH EXTRACTION     Family History  Problem Relation Age of Onset   Diabetes Mother    Thyroid disease Mother    Heart disease Father    Hyperlipidemia Father    Hypertension Father    Diabetes Maternal Aunt    Diabetes  Maternal Uncle    Diabetes Maternal Grandfather    Breast cancer Paternal Grandmother        unknown age   Hyperlipidemia Paternal Grandmother    Early death Paternal Grandfather 57   Heart disease Paternal Grandfather    Hyperlipidemia Paternal Grandfather    Hypertension Paternal Grandfather    Mental illness Brother    Anesthesia problems Neg Hx    Hypotension Neg Hx    Malignant hyperthermia Neg Hx    Pseudochol deficiency Neg Hx     Current Outpatient Medications:    acetaminophen (TYLENOL) 500 MG tablet, Take 1,000 mg by mouth every 6 (six) hours as needed for mild pain., Disp: , Rfl:    albuterol (VENTOLIN HFA) 108 (90 Base) MCG/ACT inhaler, Inhale 2 puffs into the lungs every 4 (four) hours as needed for wheezing or shortness of breath., Disp: 8 g, Rfl: 5   b complex vitamins capsule, Take 1 capsule by mouth daily., Disp: , Rfl:    Cholecalciferol (VITAMIN D3) 125 MCG (5000 UT) CAPS, Take 1 capsule (5,000 Units total) by mouth daily., Disp: 30 capsule, Rfl:    Glucosamine-Chondroitin (GLUCOSAMINE CHONDR COMPLEX PO), Take by mouth., Disp: , Rfl:    ibuprofen (ADVIL) 800 MG tablet, Take 1 tablet (800 mg total) by mouth every 8 (eight) hours as needed for up to 30 doses., Disp: 30 tablet, Rfl: 0   magnesium gluconate (MAGONATE) 500 MG tablet, Take 1,000 mg by mouth daily. , Disp: , Rfl:    polyethylene glycol powder (GLYCOLAX/MIRALAX) 17 GM/SCOOP powder, Take by mouth., Disp: , Rfl:  Allergies  Allergen Reactions   Percocet [Oxycodone-Acetaminophen] Other (See Comments)    hallucinations     ROS: A complete ROS was performed with pertinent positives/negatives noted in the HPI. The remainder of the ROS are negative.    Objective:   There were no vitals filed for this visit.  GENERAL: Well-appearing, in NAD. Well nourished.  SKIN: Pink, warm and dry. No rash, lesion, ulceration, or ecchymoses.  NECK: Trachea midline. Full ROM w/o pain or tenderness. No lymphadenopathy.   RESPIRATORY: Chest wall symmetrical. Respirations even and non-labored. Breath sounds clear to auscultation bilaterally.  CARDIAC: S1, S2 present, regular rate and rhythm. Peripheral pulses 2+ bilaterally.  MSK: Muscle tone and strength appropriate for age. Joints w/o tenderness, redness, or swelling. EXTREMITIES: Without clubbing, cyanosis, or edema.  NEUROLOGIC: No motor or sensory deficits. Steady, even gait.  PSYCH/MENTAL STATUS: Alert, oriented x 3. Cooperative, appropriate mood and affect.    No results found for any visits on 09/08/23.    Assessment & Plan:  Assessment and Plan Assessment & Plan       There are no diagnoses linked to this encounter. No orders of the defined types were placed in this encounter.  No orders of the defined types were placed in this encounter.  Lab Orders  No laboratory test(s) ordered today   No images are attached to the encounter or orders placed in  the encounter.  No follow-ups on file.   Salvatore Decent, FNP

## 2023-09-08 ENCOUNTER — Ambulatory Visit: Admitting: Internal Medicine

## 2023-09-08 ENCOUNTER — Encounter: Payer: Self-pay | Admitting: Internal Medicine

## 2023-09-08 VITALS — BP 116/78 | HR 68 | Temp 98.4°F | Ht 59.0 in | Wt 161.0 lb

## 2023-09-08 DIAGNOSIS — K649 Unspecified hemorrhoids: Secondary | ICD-10-CM

## 2023-09-08 DIAGNOSIS — R5383 Other fatigue: Secondary | ICD-10-CM | POA: Diagnosis not present

## 2023-09-08 DIAGNOSIS — R519 Headache, unspecified: Secondary | ICD-10-CM | POA: Diagnosis not present

## 2023-09-08 LAB — CBC WITH DIFFERENTIAL/PLATELET
Basophils Absolute: 0 10*3/uL (ref 0.0–0.1)
Basophils Relative: 0.5 % (ref 0.0–3.0)
Eosinophils Absolute: 0.1 10*3/uL (ref 0.0–0.7)
Eosinophils Relative: 1.5 % (ref 0.0–5.0)
HCT: 43.7 % (ref 36.0–46.0)
Hemoglobin: 14.9 g/dL (ref 12.0–15.0)
Lymphocytes Relative: 22.6 % (ref 12.0–46.0)
Lymphs Abs: 1.6 10*3/uL (ref 0.7–4.0)
MCHC: 34.1 g/dL (ref 30.0–36.0)
MCV: 90.4 fl (ref 78.0–100.0)
Monocytes Absolute: 0.7 10*3/uL (ref 0.1–1.0)
Monocytes Relative: 9 % (ref 3.0–12.0)
Neutro Abs: 4.8 10*3/uL (ref 1.4–7.7)
Neutrophils Relative %: 66.4 % (ref 43.0–77.0)
Platelets: 299 10*3/uL (ref 150.0–400.0)
RBC: 4.83 Mil/uL (ref 3.87–5.11)
RDW: 12.8 % (ref 11.5–15.5)
WBC: 7.3 10*3/uL (ref 4.0–10.5)

## 2023-09-08 LAB — POCT GLYCOSYLATED HEMOGLOBIN (HGB A1C): Hemoglobin A1C: 5.4 % (ref 4.0–5.6)

## 2023-09-08 LAB — VITAMIN D 25 HYDROXY (VIT D DEFICIENCY, FRACTURES): VITD: 38.38 ng/mL (ref 30.00–100.00)

## 2023-09-08 LAB — TSH: TSH: 2.56 u[IU]/mL (ref 0.35–5.50)

## 2023-09-08 LAB — COMPREHENSIVE METABOLIC PANEL WITH GFR
ALT: 17 U/L (ref 0–35)
AST: 17 U/L (ref 0–37)
Albumin: 4.8 g/dL (ref 3.5–5.2)
Alkaline Phosphatase: 57 U/L (ref 39–117)
BUN: 8 mg/dL (ref 6–23)
CO2: 26 meq/L (ref 19–32)
Calcium: 9.4 mg/dL (ref 8.4–10.5)
Chloride: 103 meq/L (ref 96–112)
Creatinine, Ser: 0.73 mg/dL (ref 0.40–1.20)
GFR: 105.57 mL/min (ref >60.00)
Glucose, Bld: 98 mg/dL (ref 70–99)
Potassium: 4.7 meq/L (ref 3.5–5.1)
Sodium: 139 meq/L (ref 135–145)
Total Bilirubin: 0.5 mg/dL (ref 0.2–1.2)
Total Protein: 7.3 g/dL (ref 6.0–8.3)

## 2023-09-08 MED ORDER — HYDROCORTISONE ACETATE 25 MG RE SUPP: 25.0000 mg | Freq: Two times a day (BID) | RECTAL | 0 refills | Status: AC | PRN

## 2023-09-08 NOTE — Patient Instructions (Signed)
 STOP Claritin. Take Zyrtec-D once daily for 1-2 weeks. Then can do Zyrtec (without D) .   Flonase (Fluticasone) 1 spray each nostril 1-2 times a day

## 2023-09-11 ENCOUNTER — Other Ambulatory Visit: Payer: Self-pay | Admitting: Internal Medicine

## 2023-09-11 ENCOUNTER — Encounter: Payer: Self-pay | Admitting: Internal Medicine

## 2023-09-11 DIAGNOSIS — R5383 Other fatigue: Secondary | ICD-10-CM

## 2023-09-25 ENCOUNTER — Other Ambulatory Visit: Payer: Self-pay | Admitting: Nurse Practitioner

## 2023-09-25 ENCOUNTER — Ambulatory Visit
Admission: RE | Admit: 2023-09-25 | Discharge: 2023-09-25 | Disposition: A | Discharge status: Home / Self Care | Payer: 59 | Source: Ambulatory Visit | Attending: Nurse Practitioner | Admitting: Nurse Practitioner

## 2023-09-25 DIAGNOSIS — N6310 Unspecified lump in the right breast, unspecified quadrant: Secondary | ICD-10-CM

## 2023-09-25 DIAGNOSIS — R921 Mammographic calcification found on diagnostic imaging of breast: Secondary | ICD-10-CM

## 2023-09-25 DIAGNOSIS — N6459 Other signs and symptoms in breast: Secondary | ICD-10-CM | POA: Diagnosis not present

## 2023-09-25 DIAGNOSIS — D241 Benign neoplasm of right breast: Secondary | ICD-10-CM | POA: Diagnosis not present

## 2023-09-26 ENCOUNTER — Other Ambulatory Visit: Payer: Self-pay | Admitting: Nurse Practitioner

## 2023-09-26 ENCOUNTER — Encounter: Payer: Self-pay | Admitting: Nurse Practitioner

## 2023-09-26 DIAGNOSIS — R921 Mammographic calcification found on diagnostic imaging of breast: Secondary | ICD-10-CM

## 2023-09-27 ENCOUNTER — Encounter: Payer: Self-pay | Admitting: Nurse Practitioner

## 2023-10-04 DIAGNOSIS — M47816 Spondylosis without myelopathy or radiculopathy, lumbar region: Secondary | ICD-10-CM | POA: Diagnosis not present

## 2023-10-18 ENCOUNTER — Ambulatory Visit: Payer: 59 | Admitting: Nurse Practitioner

## 2023-10-18 ENCOUNTER — Encounter: Payer: Self-pay | Admitting: Nurse Practitioner

## 2023-10-18 VITALS — BP 108/68 | HR 68 | Temp 97.3°F | Ht 60.0 in | Wt 163.2 lb

## 2023-10-18 DIAGNOSIS — Z0001 Encounter for general adult medical examination with abnormal findings: Secondary | ICD-10-CM | POA: Diagnosis not present

## 2023-10-18 DIAGNOSIS — I83812 Varicose veins of left lower extremities with pain: Secondary | ICD-10-CM

## 2023-10-18 DIAGNOSIS — F419 Anxiety disorder, unspecified: Secondary | ICD-10-CM | POA: Diagnosis not present

## 2023-10-18 DIAGNOSIS — G894 Chronic pain syndrome: Secondary | ICD-10-CM

## 2023-10-18 MED ORDER — DULOXETINE HCL 20 MG PO CPEP: 20.0000 mg | ORAL_CAPSULE | Freq: Every day | ORAL | 5 refills | Status: DC

## 2023-10-18 MED ORDER — DULOXETINE HCL 20 MG PO CPEP
20.0000 mg | ORAL_CAPSULE | Freq: Every day | ORAL | 5 refills | Status: AC
Start: 2023-10-18 — End: ?

## 2023-10-18 NOTE — Patient Instructions (Signed)
 Avoid ALCOHOL consumption Start cymbalta 20mg  daily Ok to use melatonin 10mg  at bedtime for sleep Resume therapy sessions

## 2023-10-18 NOTE — Assessment & Plan Note (Signed)
 Chronic, worse in last several months,  Associated with disrupted sleep due to racing thoughts and panic attacks Hx of chronic back pain which also disrupts sleep  had previous sessions with therapist x 6months, last session 4months ago. Stopped due to schedule conflict. Previous use of trazodone 50-100mg  x 75month, ineffective. Caffeine: 1cup dail of cooffee ALCOHOL: 2-3glasses of wine EOD No illicit drug use No SI or HI  Advised to avoid ALCOHOL use and resume sessions with therapist Start cymbalta 20mg  daily Use melatonin 10mg  at hs F/up in 75month

## 2023-10-18 NOTE — Progress Notes (Signed)
 Complete physical exam  Patient: Molly Rojas   DOB: [birth year]   38 y.o. Female  MRN: [id number] Visit Date: 10/18/2023  Subjective:     Chief Complaint  Patient presents with   Annual Exam   Molly Rojas is a 38 y.o. female who presents today for a complete physical exam. She reports consuming a general diet. No consistent exercise regimen She generally feels well. She reports sleeping well. She does not have additional problems to discuss today.  Vision:No Dental:Yes STD Screen:No  BP Readings from Last 3 Encounters:  10/18/23 108/68  09/08/23 116/78  06/26/23 100/60   Wt Readings from Last 3 Encounters:  10/18/23 163 lb 3.2 oz (74 kg)  09/08/23 161 lb (73 kg)  06/26/23 161 lb (73 kg)   Most recent fall risk assessment:    10/18/2023    8:26 AM  Fall Risk   Falls in the past year? 0  Number falls in past yr: 0  Injury with Fall? 0  Risk for fall due to : No Fall Risks  Follow up Falls evaluation completed   Depression screen:Yes - No Depression Most recent depression screenings:    10/18/2023    8:27 AM 06/26/2023    1:05 PM  PHQ 2/9 Scores  PHQ - 2 Score 2 0  PHQ- 9 Score 8       10/18/2023    8:27 AM 06/26/2023    1:05 PM 06/21/2023    3:23 PM  Depression screen PHQ 2/9  Decreased Interest 1 0 0  Down, Depressed, Hopeless 1 0 0  PHQ - 2 Score 2 0 0  Altered sleeping 3    Tired, decreased energy 2    Change in appetite 0    Feeling bad or failure about yourself  0    Trouble concentrating 1    Moving slowly or fidgety/restless 0    Suicidal thoughts 0    PHQ-9 Score 8    Difficult doing work/chores Not difficult at all         10/18/2023    8:27 AM 10/17/2022    8:58 AM 04/06/2021    8:38 AM 01/01/2021    2:40 PM  GAD 7 : Generalized Anxiety Score  Nervous, Anxious, on Edge 1 1 1  0  Control/stop worrying 1 0 1 0  Worry too much - different things 1 0 1 1  Trouble relaxing 1 1 1 1   Restless 1 0 0 1  Easily annoyed or irritable 1 0 1 1  Afraid  - awful might happen 1 0 1 0  Total GAD 7 Score 7 2 6 4   Anxiety Difficulty Not difficult at all Not difficult at all Not difficult at all Not difficult at all     HPI  Anxiety Chronic, worse in last several months,  Associated with disrupted sleep due to racing thoughts and panic attacks Hx of chronic back pain which also disrupts sleep  had previous sessions with therapist x 6months, last session 4months ago. Stopped due to schedule conflict. Previous use of trazodone 50-100mg  x 67month, ineffective. Caffeine: 1cup dail of cooffee ALCOHOL: 2-3glasses of wine EOD No illicit drug use No SI or HI  Advised to avoid ALCOHOL use and resume sessions with therapist Start cymbalta 20mg  daily Use melatonin 10mg  at hs F/up in 67month  Past Medical History:  Diagnosis Date   Abnormal Pap smear    Anemia    with pregnancy   Anxiety    GERD (gastroesophageal  reflux disease)    with pregnancy    Headache(784.0)    HPV (human papilloma virus) infection    No pertinent past medical history    Peripheral vascular disease (HCC) 03/2017   burned vein   Preterm contractions 08/17/2016   S/P cesarean section 07/22/2015   Scoliosis    Scoliosis    Umbilical hernia    Vaginal Pap smear, abnormal    Past Surgical History:  Procedure Laterality Date   ABDOMINAL HYSTERECTOMY     BACK SURGERY     BREAST ENHANCEMENT SURGERY Bilateral 06/06/2020   CESAREAN SECTION     CESAREAN SECTION  03/28/2011   Procedure: CESAREAN SECTION;  Surgeon: Chandler Combs;  Location: [facility];  Service: Gynecology;  Laterality: N/A;   CESAREAN SECTION N/A 07/22/2015   Procedure: CESAREAN SECTION;  Surgeon: Ashby Lawman, MD;  Location: [facility];  Service: Obstetrics;  Laterality: N/A;  Repeat edc 07/28/15 NKDA   CESAREAN SECTION N/A 08/18/2016   Procedure: CESAREAN SECTION;  Surgeon: Woodrow Hazy, MD;  Location: [facility];  Service: Obstetrics;  Laterality: N/A;   CYSTOSCOPY N/A 04/19/2018   Procedure:  CYSTOSCOPY;  Surgeon: Concepcion Deck, MD;  Location: [facility];  Service: Gynecology;  Laterality: N/A;   DENTAL SURGERY     ENDOVENOUS ABLATION SAPHENOUS VEIN W/ LASER Right 06/05/2017   endovenous laser ablation R GSV and stab phlebectomy 10-20 incisions R leg by Merced Stair MD    HERNIA REPAIR     HYSTERECTOMY ABDOMINAL WITH SALPINGO-OOPHORECTOMY Bilateral 04/19/2018   Procedure: HYSTERECTOMY ABDOMINAL WITH BILATERAL SALPINGECTOMY;  Surgeon: Concepcion Deck, MD;  Location: [facility];  Service: Gynecology;  Laterality: Bilateral;   SPINAL FUSION  placed rods from T-11-L4   for scoliosis, spinal fusion with screws and rods   tummy tuck     UMBILICAL HERNIA REPAIR N/A 04/19/2018   Procedure: OPEN UMBILICAL HERNIA REPAIR;  Surgeon: Aldean Hummingbird, MD;  Location: [facility];  Service: General;  Laterality: N/A;   WISDOM TOOTH EXTRACTION     Social History   Socioeconomic History   Marital status: Married    Spouse name: Brenita Callow   Number of children: 3   Years of education: Not on file   Highest education level: Not on file  Occupational History   Occupation: Nurse at [employer]  Tobacco Use   Smoking status: Never   Smokeless tobacco: Former  Building services engineer status: Never Used  Substance and Sexual Activity   Alcohol use: Yes    Alcohol/week: 8.0 standard drinks of alcohol    Types: 8 Glasses of wine per week   Drug use: No   Sexual activity: Yes    Birth control/protection: Surgical    Comment: s/p hysterectomy  Other Topics Concern   Not on file  Social History Narrative   She is from [place], [place] but has been in [place] for 14 years. Lives at home with husband and 3 kids. Not fasting today.    Social Drivers of Corporate investment banker Strain: Not on file  Food Insecurity: Not on file  Transportation Needs: Not on file  Physical Activity: Not on file  Stress: Not on file  Social Connections: Not on file  Intimate Partner Violence: Not on file   Family  Status  Relation Name Status   Mother  Alive   Father  Alive   Brother  Alive   Mat Aunt  (Not Specified)   Nurse, mental health  (Not Specified)  MGM  Deceased   MGF  Deceased   PGM  Alive   PGF  Deceased   Neg Hx  (Not Specified)  No partnership data on file   Family History  Problem Relation Age of Onset   Diabetes Mother    Fibromyalgia Mother    Hypothyroidism Mother    Heart disease Father    Hyperlipidemia Father    Hypertension Father    Mental illness Brother    Diabetes Maternal Aunt    Diabetes Maternal Uncle    Diabetes Maternal Grandfather    Breast cancer Paternal Grandmother        unknown age   Hyperlipidemia Paternal Grandmother    Early death Paternal Grandfather 17   Heart disease Paternal Grandfather    Hyperlipidemia Paternal Grandfather    Hypertension Paternal Grandfather    Anesthesia problems Neg Hx    Hypotension Neg Hx    Malignant hyperthermia Neg Hx    Pseudochol deficiency Neg Hx    Allergies  Allergen Reactions   Percocet [Oxycodone -Acetaminophen ] Other (See Comments)    hallucinations    Patient Care Team: Christean Silvestri, Connye Delaine, NP as PCP - General (Internal Medicine)   Medications: Outpatient Medications Prior to Visit  Medication Sig   acetaminophen  (TYLENOL ) 500 MG tablet Take 1,000 mg by mouth every 6 (six) hours as needed for mild pain.   albuterol  (VENTOLIN  HFA) 108 (90 Base) MCG/ACT inhaler Inhale 2 puffs into the lungs every 4 (four) hours as needed for wheezing or shortness of breath.   b complex vitamins capsule Take 1 capsule by mouth daily.   BERBERINE CHLORIDE PO Take 1 tablet by mouth daily.   BITTER MELON PO Take 1 tablet by mouth daily.   Cholecalciferol (VITAMIN D3) 125 MCG (5000 UT) CAPS Take 1 capsule (5,000 Units total) by mouth daily.   CINNAMON PO Take 1 tablet by mouth daily.   Glucosamine-Chondroitin (GLUCOSAMINE CHONDR COMPLEX PO) Take by mouth.   hydrocortisone  (ANUSOL -HC) 25 MG suppository Place 1 suppository (25  mg total) rectally 2 (two) times daily as needed for hemorrhoids or anal itching.   ibuprofen  (ADVIL ) 800 MG tablet Take 1 tablet (800 mg total) by mouth every 8 (eight) hours as needed for up to 30 doses.   magnesium  gluconate (MAGONATE) 500 MG tablet Take 1,000 mg by mouth daily.    polyethylene glycol powder (GLYCOLAX/MIRALAX) 17 GM/SCOOP powder Take by mouth.   Probiotic Product (PROBIOTIC DAILY PO) Take 1 tablet by mouth daily.   Zinc Sulfate (ZINC 15 PO) Take 1 tablet by mouth daily.   No facility-administered medications prior to visit.    Review of Systems  Constitutional:  Negative for activity change, appetite change and unexpected weight change.  Respiratory: Negative.    Cardiovascular: Negative.   Gastrointestinal: Negative.   Endocrine: Negative for cold intolerance and heat intolerance.  Genitourinary: Negative.   Musculoskeletal: Negative.   Skin: Negative.   Neurological: Negative.   Hematological: Negative.   Psychiatric/Behavioral:  Positive for sleep disturbance. Negative for behavioral problems, decreased concentration, dysphoric mood, hallucinations, self-injury and suicidal ideas. The patient is nervous/anxious.        Objective:  BP 108/68 (BP Location: Left Arm, Patient Position: Sitting, Cuff Size: Normal)   Pulse 68   Temp (!) 97.3 F (36.3 C) (Temporal)   Ht 5' (1.524 m)   Wt 163 lb 3.2 oz (74 kg)   LMP 10/23/2017   SpO2 98%   BMI 31.87 kg/m  Physical Exam Vitals and nursing note reviewed.  Constitutional:      General: She is not in acute distress. HENT:     Right Ear: Tympanic membrane, ear canal and external ear normal.     Left Ear: Tympanic membrane, ear canal and external ear normal.     Nose: Nose normal.  Eyes:     Extraocular Movements: Extraocular movements intact.     Conjunctiva/sclera: Conjunctivae normal.     Pupils: Pupils are equal, round, and reactive to light.  Neck:     Thyroid : No thyroid  mass, thyromegaly or  thyroid  tenderness.  Cardiovascular:     Rate and Rhythm: Normal rate and regular rhythm.     Pulses: Normal pulses.     Heart sounds: Normal heart sounds.  Pulmonary:     Effort: Pulmonary effort is normal.     Breath sounds: Normal breath sounds.  Abdominal:     General: Bowel sounds are normal.     Palpations: Abdomen is soft.  Musculoskeletal:        General: Normal range of motion.     Cervical back: Normal range of motion and neck supple.     Right lower leg: No edema.     Left lower leg: No edema.  Lymphadenopathy:     Cervical: No cervical adenopathy.  Skin:    General: Skin is warm and dry.     Findings: No erythema or lesion.  Neurological:     Mental Status: She is alert and oriented to person, place, and time.     Cranial Nerves: No cranial nerve deficit.  Psychiatric:        Mood and Affect: Mood normal.        Behavior: Behavior normal.        Thought Content: Thought content normal.      No results found for any visits on 10/18/23.    Assessment & Plan:    Routine Health Maintenance and Physical Exam  Immunization History  Administered Date(s) Administered   Influenza,inj,Quad PF,6+ Mos 08/09/2017   Influenza-Unspecified 03/28/2021   PFIZER(Purple Top)SARS-COV-2 Vaccination 06/07/2019, 06/08/2019, 06/26/2019, 06/29/2019   Tdap 08/18/2016    Health Maintenance  Topic Date Due   Hepatitis C Screening  10/17/2024 (Originally [birth year]/2004)   INFLUENZA VACCINE  01/05/2024   DTaP/Tdap/Td (2 - Td or Tdap) 08/19/2026   HIV Screening  Completed   HPV VACCINES  Aged Out   Meningococcal B Vaccine  Aged Out   COVID-19 Vaccine  Discontinued    Discussed health benefits of physical activity, and encouraged her to engage in regular exercise appropriate for her age and condition.  Problem List Items Addressed This Visit     Anxiety   Chronic, worse in last several months,  Associated with disrupted sleep due to racing thoughts and panic attacks Hx of chronic  back pain which also disrupts sleep  had previous sessions with therapist x 6months, last session 4months ago. Stopped due to schedule conflict. Previous use of trazodone 50-100mg  x 64month, ineffective. Caffeine: 1cup dail of cooffee ALCOHOL: 2-3glasses of wine EOD No illicit drug use No SI or HI  Advised to avoid ALCOHOL use and resume sessions with therapist Start cymbalta 20mg  daily Use melatonin 10mg  at hs F/up in 64month      Relevant Medications   DULoxetine (CYMBALTA) 20 MG capsule   Other Visit Diagnoses       Encounter for preventative adult health care exam with abnormal findings    -  Primary     Varicose veins of left lower extremity with pain       Relevant Orders   Ambulatory referral to Vascular Surgery     Chronic pain syndrome       Relevant Medications   DULoxetine (CYMBALTA) 20 MG capsule      Return in about 4 weeks (around 11/15/2023) for depression and anxiety.     Kathrene Parents, NP

## 2024-07-12 ENCOUNTER — Other Ambulatory Visit: Payer: Self-pay

## 2024-07-12 ENCOUNTER — Emergency Department (HOSPITAL_BASED_OUTPATIENT_CLINIC_OR_DEPARTMENT_OTHER): Admission: EM | Admit: 2024-07-12 | Payer: Self-pay | Source: Home / Self Care

## 2024-07-12 ENCOUNTER — Encounter (HOSPITAL_BASED_OUTPATIENT_CLINIC_OR_DEPARTMENT_OTHER): Payer: Self-pay

## 2024-07-12 ENCOUNTER — Emergency Department (HOSPITAL_BASED_OUTPATIENT_CLINIC_OR_DEPARTMENT_OTHER): Payer: Self-pay

## 2024-07-12 NOTE — ED Triage Notes (Signed)
 Pt reports falling and landing on L hand/wrist. Pt has good distal PMS in LUE.
